# Patient Record
Sex: Female | Born: 1945
Health system: Southern US, Community
[De-identification: ages and names within clinical notes are randomized; demographics above are authoritative.]

## PROBLEM LIST (undated history)

## (undated) DIAGNOSIS — I5189 Other ill-defined heart diseases: Secondary | ICD-10-CM

## (undated) DIAGNOSIS — K5790 Diverticulosis of intestine, part unspecified, without perforation or abscess without bleeding: Secondary | ICD-10-CM

## (undated) DIAGNOSIS — I519 Heart disease, unspecified: Secondary | ICD-10-CM

## (undated) DIAGNOSIS — Z780 Asymptomatic menopausal state: Secondary | ICD-10-CM

## (undated) DIAGNOSIS — I4891 Unspecified atrial fibrillation: Secondary | ICD-10-CM

## (undated) DIAGNOSIS — E785 Hyperlipidemia, unspecified: Secondary | ICD-10-CM

## (undated) DIAGNOSIS — M109 Gout, unspecified: Secondary | ICD-10-CM

## (undated) DIAGNOSIS — K56609 Unspecified intestinal obstruction, unspecified as to partial versus complete obstruction: Secondary | ICD-10-CM

## (undated) HISTORY — DX: Hyperlipidemia, unspecified: E78.5

## (undated) HISTORY — DX: Diverticulosis of intestine, part unspecified, without perforation or abscess without bleeding: K57.90

## (undated) HISTORY — PX: CATARACT EXTRACTION: SUR2

## (undated) HISTORY — DX: Asymptomatic menopausal state: Z78.0

---
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
## 1999-06-11 HISTORY — PX: APPENDECTOMY: SHX54

## 1999-06-11 HISTORY — PX: ABDOMINAL HYSTERECTOMY: SHX81

## 1999-06-11 HISTORY — PX: UMBILICAL HERNIA REPAIR: SHX196

## 2005-08-28 ENCOUNTER — Ambulatory Visit (HOSPITAL_COMMUNITY): Admission: RE | Admit: 2005-08-28 | Discharge: 2005-08-28 | Payer: Self-pay | Admitting: Family Medicine

## 2011-07-24 ENCOUNTER — Ambulatory Visit (INDEPENDENT_AMBULATORY_CARE_PROVIDER_SITE_OTHER): Payer: BC Managed Care – PPO | Admitting: Internal Medicine

## 2011-07-24 ENCOUNTER — Ambulatory Visit (HOSPITAL_BASED_OUTPATIENT_CLINIC_OR_DEPARTMENT_OTHER)
Admission: RE | Admit: 2011-07-24 | Discharge: 2011-07-24 | Disposition: A | Discharge status: Home / Self Care | Payer: BC Managed Care – PPO | Source: Ambulatory Visit | Attending: Internal Medicine | Admitting: Internal Medicine

## 2011-07-24 ENCOUNTER — Ambulatory Visit (INDEPENDENT_AMBULATORY_CARE_PROVIDER_SITE_OTHER)
Admission: RE | Admit: 2011-07-24 | Discharge: 2011-07-24 | Disposition: A | Discharge status: Home / Self Care | Payer: BC Managed Care – PPO | Source: Ambulatory Visit | Attending: Internal Medicine | Admitting: Internal Medicine

## 2011-07-24 ENCOUNTER — Other Ambulatory Visit: Payer: Self-pay | Admitting: Internal Medicine

## 2011-07-24 ENCOUNTER — Encounter: Payer: Self-pay | Admitting: Internal Medicine

## 2011-07-24 DIAGNOSIS — M129 Arthropathy, unspecified: Secondary | ICD-10-CM

## 2011-07-24 DIAGNOSIS — R030 Elevated blood-pressure reading, without diagnosis of hypertension: Secondary | ICD-10-CM

## 2011-07-24 DIAGNOSIS — M1710 Unilateral primary osteoarthritis, unspecified knee: Secondary | ICD-10-CM

## 2011-07-24 DIAGNOSIS — M1990 Unspecified osteoarthritis, unspecified site: Secondary | ICD-10-CM

## 2011-07-24 DIAGNOSIS — Z1231 Encounter for screening mammogram for malignant neoplasm of breast: Secondary | ICD-10-CM

## 2011-07-24 DIAGNOSIS — Z139 Encounter for screening, unspecified: Secondary | ICD-10-CM

## 2011-07-24 DIAGNOSIS — E785 Hyperlipidemia, unspecified: Secondary | ICD-10-CM

## 2011-07-24 DIAGNOSIS — IMO0002 Reserved for concepts with insufficient information to code with codable children: Secondary | ICD-10-CM | POA: Insufficient documentation

## 2011-07-24 DIAGNOSIS — K5790 Diverticulosis of intestine, part unspecified, without perforation or abscess without bleeding: Secondary | ICD-10-CM | POA: Insufficient documentation

## 2011-07-24 DIAGNOSIS — R229 Localized swelling, mass and lump, unspecified: Secondary | ICD-10-CM

## 2011-07-24 DIAGNOSIS — M25569 Pain in unspecified knee: Secondary | ICD-10-CM

## 2011-07-24 DIAGNOSIS — M25469 Effusion, unspecified knee: Secondary | ICD-10-CM

## 2011-07-24 DIAGNOSIS — M79609 Pain in unspecified limb: Secondary | ICD-10-CM

## 2011-07-24 DIAGNOSIS — M19049 Primary osteoarthritis, unspecified hand: Secondary | ICD-10-CM

## 2011-07-24 DIAGNOSIS — Z780 Asymptomatic menopausal state: Secondary | ICD-10-CM | POA: Insufficient documentation

## 2011-07-24 NOTE — Patient Instructions (Signed)
To go for xray now  Schedule CPE  Check  BP at an outpatient pharmacy  Labs will be mailed to you
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
## 2011-07-25 LAB — COMPREHENSIVE METABOLIC PANEL
ALT: 22 U/L (ref 0–35)
AST: 22 U/L (ref 0–37)
BUN: 12 mg/dL (ref 6–23)
CO2: 25 mEq/L (ref 19–32)
Calcium: 9.2 mg/dL (ref 8.4–10.5)
Chloride: 107 mEq/L (ref 96–112)
Creat: 0.8 mg/dL (ref 0.50–1.10)
Potassium: 5 mEq/L (ref 3.5–5.3)
Total Bilirubin: 0.6 mg/dL (ref 0.3–1.2)
Total Protein: 7.1 g/dL (ref 6.0–8.3)

## 2011-07-25 LAB — CBC WITH DIFFERENTIAL/PLATELET
Basophils Absolute: 0 10*3/uL (ref 0.0–0.1)
HCT: 45.5 % (ref 36.0–46.0)
MCV: 88.3 fL (ref 78.0–100.0)
Monocytes Absolute: 0.4 10*3/uL (ref 0.1–1.0)
Monocytes Relative: 6 % (ref 3–12)
Platelets: 230 10*3/uL (ref 150–400)
WBC: 6.7 10*3/uL (ref 4.0–10.5)

## 2011-07-25 LAB — LIPID PANEL
Cholesterol: 220 mg/dL — ABNORMAL HIGH (ref 0–200)
HDL: 67 mg/dL (ref >39)
LDL Cholesterol: 134 mg/dL — ABNORMAL HIGH (ref 0–99)
Triglycerides: 95 mg/dL (ref <150)
VLDL: 19 mg/dL (ref 0–40)

## 2011-07-26 LAB — ANA: Anti Nuclear Antibody(ANA): NEGATIVE

## 2011-07-29 ENCOUNTER — Encounter: Payer: Self-pay | Admitting: Emergency Medicine

## 2011-08-06 ENCOUNTER — Encounter: Payer: Self-pay | Admitting: Internal Medicine

## 2011-08-06 ENCOUNTER — Encounter: Payer: Self-pay | Admitting: Gastroenterology

## 2011-08-06 ENCOUNTER — Ambulatory Visit (INDEPENDENT_AMBULATORY_CARE_PROVIDER_SITE_OTHER): Payer: BC Managed Care – PPO | Admitting: Internal Medicine

## 2011-08-06 VITALS — BP 148/84 | HR 70 | Temp 97.0°F | Resp 12 | Ht 65.0 in | Wt 231.0 lb

## 2011-08-06 DIAGNOSIS — M1990 Unspecified osteoarthritis, unspecified site: Secondary | ICD-10-CM

## 2011-08-06 DIAGNOSIS — IMO0001 Reserved for inherently not codable concepts without codable children: Secondary | ICD-10-CM

## 2011-08-06 DIAGNOSIS — Z23 Encounter for immunization: Secondary | ICD-10-CM

## 2011-08-06 DIAGNOSIS — Z139 Encounter for screening, unspecified: Secondary | ICD-10-CM

## 2011-08-06 DIAGNOSIS — Z800 Family history of malignant neoplasm of digestive organs: Secondary | ICD-10-CM

## 2011-08-06 DIAGNOSIS — Z0000 Encounter for general adult medical examination without abnormal findings: Secondary | ICD-10-CM

## 2011-08-06 DIAGNOSIS — E669 Obesity, unspecified: Secondary | ICD-10-CM

## 2011-08-06 DIAGNOSIS — Z780 Asymptomatic menopausal state: Secondary | ICD-10-CM

## 2011-08-06 DIAGNOSIS — N951 Menopausal and female climacteric states: Secondary | ICD-10-CM

## 2011-08-06 DIAGNOSIS — E785 Hyperlipidemia, unspecified: Secondary | ICD-10-CM

## 2011-08-06 DIAGNOSIS — R030 Elevated blood-pressure reading, without diagnosis of hypertension: Secondary | ICD-10-CM

## 2011-08-06 LAB — POCT URINALYSIS DIPSTICK
Leukocytes, UA: NEGATIVE
Nitrite, UA: NEGATIVE
Protein, UA: NEGATIVE

## 2011-08-06 NOTE — Patient Instructions (Signed)
  To see GI MD for colonoscopy  Follow dash diet.    Will refer to rheumatology

## 2011-08-06 NOTE — Progress Notes (Signed)
Subjective:    Patient ID: Kelly Walker, female    DOB: 1945-08-28, 66 y.o.   MRN: [id number]  HPI Kelly Walker is here for comprehensive eval.  Overall doing well.   I reviewed in detail her lab results and imaging studies of her hands and knees.  She had OA of many joints.  Takes one Advil on occasion for discomfort.    She is due for Tetanus.  Last colonscopy 2001 and has FH of colon CA in uncle and father.  She denies wt loss or change in stools or blood in stools.  Kelly Walker brings a copy of her outpt bp's that she has been checking at home  Most SBP are in 120 to upper 130's.  Rare SBP is over 140  .  She denies chest pain, headache, visual changes  Allergies  Allergen Reactions  . Ciprofloxacin Anxiety  . Flagyl (Metronidazole) Anxiety   Past Medical History  Diagnosis Date  . Menopause   . Hyperlipidemia   . Diverticulosis    Past Surgical History  Procedure Date  . Tonsillectomy 1957  . Abdominal hysterectomy 2001   History   Social History  . Marital Status: Married    Spouse Name: N/A    Number of Children: N/A  . Years of Education: N/A   Occupational History  . Not on file.   Social History Main Topics  . Smoking status: Former Smoker -- 1.0 packs/day for 4 years    Quit date: 06/10/1972  . Smokeless tobacco: Never Used  . Alcohol Use: Yes     socially  . Drug Use: No  . Sexually Active: Yes   Other Topics Concern  . Not on file   Social History Narrative  . No narrative on file   Family History  Problem Relation Age of Onset  . Heart failure Mother   . Diabetes Mother   . Heart attack Father   . Colon cancer Father    Patient Active Problem List  Diagnoses  . Hyperlipidemia  . Menopause  . Diverticulosis  . Elevated blood pressure  . DJD (degenerative joint disease)   Current Outpatient Prescriptions on File Prior to Visit  Medication Sig Dispense Refill  . ibuprofen (ADVIL,MOTRIN) 200 MG tablet Take 200 mg by mouth every 6 (six) hours as  needed.           Review of Systems  Constitutional: Negative for unexpected weight change.  HENT: Negative for hearing loss and ear pain.   Eyes: Negative for visual disturbance.  Respiratory: Negative for cough, chest tightness, shortness of breath and wheezing.   Cardiovascular: Positive for leg swelling. Negative for chest pain and palpitations.  Gastrointestinal: Negative for abdominal pain.  Genitourinary: Negative for dysuria, frequency and pelvic pain.  Musculoskeletal: Positive for joint swelling and arthralgias. Negative for gait problem.  Skin: Negative for rash.  Neurological: Negative for speech difficulty, light-headedness, numbness and headaches.       Objective:   Physical Exam Physical Exam  Nursing note and vitals reviewed.  Constitutional: She is oriented to person, place, and time. She appears well-developed and well-nourished.  HENT:  Head: Normocephalic and atraumatic.  Right Ear: Tympanic membrane and ear canal normal. No drainage. Tympanic membrane is not injected and not erythematous.  Left Ear: Tympanic membrane and ear canal normal. No drainage. Tympanic membrane is not injected and not erythematous.  Nose: Nose normal. Right sinus exhibits no maxillary sinus tenderness and no frontal sinus tenderness. Left sinus exhibits no maxillary  sinus tenderness and no frontal sinus tenderness.  Mouth/Throat: Oropharynx is clear and moist. No oral lesions. No oropharyngeal exudate.  Eyes: Conjunctivae and EOM are normal. Pupils are equal, round, and reactive to light.  Neck: Normal range of motion. Neck supple. No JVD present. Carotid bruit is not present. No mass and no thyromegaly present.  Cardiovascular: Normal rate, regular rhythm, S1 normal, S2 normal and intact distal pulses. Exam reveals no gallop and no friction rub.  No murmur heard.  Pulses:  Carotid pulses are 2+ on the right side, and 2+ on the left side.  Dorsalis pedis pulses are 2+ on the right  side, and 2+ on the left side.  No carotid bruit. No LE edema  Pulmonary/Chest: Breath sounds normal. She has no wheezes. She has no rales. She exhibits no tenderness. Breasts no discrete masses no nipple discharge no axillary adenopathy bilaterally Abdominal: Soft. Bowel sounds are normal. She exhibits no distension and no mass. There is no hepatosplenomegaly. There is no tenderness. There is no CVA tenderness.  Rectal bbrown stool guaiac neg.  Musculoskeletal: Normal range of motion.   OA changes both hands No active synovitis to joints.  Lymphadenopathy:  She has no cervical adenopathy.  She has no axillary adenopathy.  Right: No inguinal and no supraclavicular adenopathy present.  Left: No inguinal and no supraclavicular adenopathy present.  Neurological: She is alert and oriented to person, place, and time. She has normal strength and normal reflexes. She displays no tremor. No cranial nerve deficit or sensory deficit. Coordination and gait normal.  Skin: Skin is warm and dry. No rash noted. No cyanosis. Nails show no clubbing.  Psychiatric: She has a normal mood and affect. Her speech is normal and behavior is normal. Cognition and memory are normal.       Assessment & Plan:  1)  Health Maintenance  See scanned HM sheet.  Tdap today will refer to GI for colonscopy 2)  Elevated BP.  May be some component of white coaT HTN.  OUtpt bp's mostly in normal range.  Will recheck 6 months/  She does not wish meds at this point.  Recommended low salt, DASH diet 3)  Hyperlipidemia  DASH diet  She does not want meds at this point 4)  OA   Will refer to rheumatology 5)  Menopause 6)  FAmily history of colon CA  Needs screening q 5 years

## 2011-08-15 ENCOUNTER — Ambulatory Visit (AMBULATORY_SURGERY_CENTER): Payer: BC Managed Care – PPO | Admitting: *Deleted

## 2011-08-15 ENCOUNTER — Encounter: Payer: Self-pay | Admitting: Gastroenterology

## 2011-08-15 VITALS — Ht 65.0 in | Wt 231.5 lb

## 2011-08-15 DIAGNOSIS — Z1211 Encounter for screening for malignant neoplasm of colon: Secondary | ICD-10-CM

## 2011-08-15 MED ORDER — PEG-KCL-NACL-NASULF-NA ASC-C 100 G PO SOLR: ORAL | Status: DC

## 2011-08-15 NOTE — Progress Notes (Addendum)
Last colonoscopy over 10 years ago.  Pt does not remember the physician's name that performed the procedure.  Pt says that colonoscopy was normal.  Family hx colon cancer in father and paternal uncle.  Was told to have colonoscopy every 5 years.  Kelly Walker

## 2011-08-28 ENCOUNTER — Ambulatory Visit (AMBULATORY_SURGERY_CENTER): Payer: BC Managed Care – PPO | Admitting: Gastroenterology

## 2011-08-28 ENCOUNTER — Encounter: Payer: Self-pay | Admitting: Gastroenterology

## 2011-08-28 VITALS — BP 160/88 | HR 67 | Temp 99.3°F | Resp 17 | Ht 65.0 in | Wt 231.0 lb

## 2011-08-28 DIAGNOSIS — D126 Benign neoplasm of colon, unspecified: Secondary | ICD-10-CM

## 2011-08-28 DIAGNOSIS — K5730 Diverticulosis of large intestine without perforation or abscess without bleeding: Secondary | ICD-10-CM

## 2011-08-28 DIAGNOSIS — Z1211 Encounter for screening for malignant neoplasm of colon: Secondary | ICD-10-CM

## 2011-08-28 MED ORDER — SODIUM CHLORIDE 0.9 % IV SOLN: 500.0000 mL | INTRAVENOUS | Status: DC

## 2011-08-28 NOTE — Progress Notes (Signed)
Patient did not experience any of the following events: a burn prior to discharge; a fall within the facility; wrong site/side/patient/procedure/implant event; or a hospital transfer or hospital admission upon discharge from the facility. (G8907) Patient did not have preoperative order for IV antibiotic SSI prophylaxis. (G8918)  

## 2011-08-28 NOTE — Progress Notes (Addendum)
PT. Decided she did not want sedation, because she is going to have cataracts surgery on Monday and she did not want additional medication in her system. Pt. Also concerned about red area to her left hand knuckles area appears red with fading grey discoloration surrounding red area to knuckles,pt. Made Dr. Arlyce Dice aware and he stated that look like it was caused by trauma not the prep.Knuckles pt. Concerned about is pinky and ring finger to left hand.

## 2011-08-28 NOTE — Op Note (Addendum)
[facility] [address] [place], [place]  [address]  COLONOSCOPY PROCEDURE REPORT  PATIENT:  Kelly Walker, Kelly Walker  MR#:  [id number] BIRTHDATE:  February 16, 1946, 66 yrs. old  GENDER:  female ENDOSCOPIST:  Barbette Hair. Arlyce Dice, MD REF. BY:  Raechel Chute, M.D. PROCEDURE DATE:  08/28/2011 PROCEDURE:  Colonoscopy with snare polypectomy ASA CLASS:  Class I INDICATIONS:  FAmily history of colon cancer (falther and franternal uncle); high risk screening MEDICATIONS:  None  DESCRIPTION OF PROCEDURE:   After the risks benefits and alternatives of the procedure were thoroughly explained, informed consent was obtained.  Digital rectal exam was performed and revealed no abnormalities.   The [id number] endoscope was introduced through the anus and advanced to the cecum, which was identified by both the appendix and ileocecal valve, without limitations.  The quality of the prep was good, using MoviPrep. The instrument was then slowly withdrawn as the colon was fully examined. <<PROCEDUREIMAGES>>  FINDINGS:  A sessile polyp was found in the descending colon. It was 3 mm in size. Polyp was snared without cautery. Retrieval was successful (see image4). snare polyp  A sessile polyp was found in the sigmoid colon. It was 3 mm in size. It was found 20 cm from the point of entry. Polyp was snared without cautery. Retrieval was successful (see image5). snare polyp  Moderate diverticulosis was found in the sigmoid colon (see image1).  This was otherwise a normal examination of the colon (see image2 and image6). Retroflexed views in the rectum revealed no abnormalities.    The time to cecum =  1) 8.75  minutes. The scope was then withdrawn in 1) 10.25  minutes from the cecum and the procedure completed. COMPLICATIONS:  None ENDOSCOPIC IMPRESSION: 1) 3 mm sessile polyp in the descending colon 2) 3 mm sessile polyp in the sigmoid colon 3) Moderate diverticulosis in the sigmoid colon 4) Otherwise  normal examination RECOMMENDATIONS: 1) In view of your family history of colon cancer I recommend you repeat this procedure in 5 years REPEAT EXAM:  colonoscopy 5 years  ______________________________ Barbette Hair. Arlyce Dice, MD  CC:  n. REVISED:  08/28/2011 11:53 AM eSIGNED:   Barbette Hair. Stpehanie Montroy at 08/28/2011 11:53 AM  Marchelle Gearing, [phone]

## 2011-08-28 NOTE — Patient Instructions (Addendum)
Colon Polyps A polyp is extra tissue that grows inside your body. Colon polyps grow in the large intestine. The large intestine, also called the colon, is part of your digestive system. It is a long, hollow tube at the end of your digestive tract where your body makes and stores stool. Most polyps are not dangerous. They are benign. This means they are not cancerous. But over time, some types of polyps can turn into cancer. Polyps that are smaller than a pea are usually not harmful. But larger polyps could someday become or may already be cancerous. To be safe, doctors remove all polyps and test them.  WHO GETS POLYPS? Anyone can get polyps, but certain people are more likely than others. You may have a greater chance of getting polyps if:  You are over 50.   You have had polyps before.   Someone in your family has had polyps.   Someone in your family has had cancer of the large intestine.   Find out if someone in your family has had polyps. You may also be more likely to get polyps if you:   Eat a lot of fatty foods.   Smoke.   Drink alcohol.   Do not exercise.   Eat too much.  SYMPTOMS  Most small polyps do not cause symptoms. People often do not know they have one until their caregiver finds it during a regular checkup or while testing them for something else. Some people do have symptoms like these:  Bleeding from the anus. You might notice blood on your underwear or on toilet paper after you have had a bowel movement.   Constipation or diarrhea that lasts more than a week.   Blood in the stool. Blood can make stool look black or it can show up as red streaks in the stool.  If you have any of these symptoms, see your caregiver. HOW DOES THE DOCTOR TEST FOR POLYPS? The doctor can use four tests to check for polyps:  Digital rectal exam. The caregiver wears gloves and checks your rectum (the last part of the large intestine) to see if it feels normal. This test would find  polyps only in the rectum. Your caregiver may need to do one of the other tests listed below to find polyps higher up in the intestine.   Barium enema. The caregiver puts a liquid called barium into your rectum before taking x-rays of your large intestine. Barium makes your intestine look white in the pictures. Polyps are dark, so they are easy to see.   Sigmoidoscopy. With this test, the caregiver can see inside your large intestine. A thin flexible tube is placed into your rectum. The device is called a sigmoidoscope, which has a light and a tiny video camera in it. The caregiver uses the sigmoidoscope to look at the last third of your large intestine.   Colonoscopy. This test is like sigmoidoscopy, but the caregiver looks at all of the large intestine. It usually requires sedation. This is the most common method for finding and removing polyps.  TREATMENT   The caregiver will remove the polyp during sigmoidoscopy or colonoscopy. The polyp is then tested for cancer.   If you have had polyps, your caregiver may want you to get tested regularly in the future.  PREVENTION  There is not one sure way to prevent polyps. You might be able to lower your risk of getting them if you:  Eat more fruits and vegetables and less fatty  food.   Do not smoke.   Avoid alcohol.   Exercise every day.   Lose weight if you are overweight.   Eating more calcium and folate can also lower your risk of getting polyps. Some foods that are rich in calcium are milk, cheese, and broccoli. Some foods that are rich in folate are chickpeas, kidney beans, and spinach.   Aspirin might help prevent polyps. Studies are under way.  Document Released: 02/21/2004 Document Revised: 05/16/2011 Document Reviewed: 07/29/2007 [facility] Patient Information 2012 [place], [place].  Diverticulosis Diverticulosis is a common condition that develops when small pouches (diverticula) form in the wall of the colon. The risk of  diverticulosis increases with age. It happens more often in people who eat a low-fiber diet. Most individuals with diverticulosis have no symptoms. Those individuals with symptoms usually experience abdominal pain, constipation, or loose stools (diarrhea). HOME CARE INSTRUCTIONS   Increase the amount of fiber in your diet as directed by your caregiver or dietician. This may reduce symptoms of diverticulosis.   Your caregiver may recommend taking a dietary fiber supplement.   Drink at least 6 to 8 glasses of water each day to prevent constipation.   Try not to strain when you have a bowel movement.   Your caregiver may recommend avoiding nuts and seeds to prevent complications, although this is still an uncertain benefit.   Only take over-the-counter or prescription medicines for pain, discomfort, or fever as directed by your caregiver.  FOODS WITH HIGH FIBER CONTENT INCLUDE:  Fruits. Apple, peach, pear, tangerine, raisins, prunes.   Vegetables. Brussels sprouts, asparagus, broccoli, cabbage, carrot, cauliflower, romaine lettuce, spinach, summer squash, tomato, winter squash, zucchini.   Starchy Vegetables. Baked beans, kidney beans, lima beans, split peas, lentils, potatoes (with skin).   Grains. Whole wheat bread, brown rice, bran flake cereal, plain oatmeal, white rice, shredded wheat, bran muffins.  SEEK IMMEDIATE MEDICAL CARE IF:   You develop increasing pain or severe bloating.   You have an oral temperature above 102 F (38.9 C), not controlled by medicine.   You develop vomiting or bowel movements that are bloody or black.  Document Released: 02/22/2004 Document Revised: 05/16/2011 Document Reviewed: 10/25/2009 [employer] Patient Information 2012 [employer].   YOU HAD AN ENDOSCOPIC PROCEDURE TODAY AT THE [facility]: Refer to the procedure report that was given to you for any specific questions about what was found during the examination.  If the procedure  report does not answer your questions, please call your gastroenterologist to clarify.  If you requested that your care partner not be given the details of your procedure findings, then the procedure report has been included in a sealed envelope for you to review at your convenience later.  YOU SHOULD EXPECT: Some feelings of bloating in the abdomen. Passage of more gas than usual.  Walking can help get rid of the air that was put into your GI tract during the procedure and reduce the bloating. If you had a lower endoscopy (such as a colonoscopy or flexible sigmoidoscopy) you may notice spotting of blood in your stool or on the toilet paper. If you underwent a bowel prep for your procedure, then you may not have a normal bowel movement for a few days.  DIET: Your first meal following the procedure should be a light meal and then it is ok to progress to your normal diet.  A half-sandwich or bowl of soup is an example of a good first meal.  Heavy or fried  foods are harder to digest and may make you feel nauseous or bloated.  Likewise meals heavy in dairy and vegetables can cause extra gas to form and this can also increase the bloating.  Drink plenty of fluids but you should avoid alcoholic beverages for 24 hours.  ACTIVITY: Your care partner should take you home directly after the procedure.  You should plan to take it easy, moving slowly for the rest of the day.  You can resume normal activity the day after the procedure however you should NOT DRIVE or use heavy machinery for 24 hours (because of the sedation medicines used during the test).    SYMPTOMS TO REPORT IMMEDIATELY: A gastroenterologist can be reached at any hour.  During normal business hours, 8:30 AM to 5:00 PM Monday through Friday, call [phone].  After hours and on weekends, please call the GI answering service at [phone] who will take a message and have the physician on call contact you.   Following lower endoscopy  (colonoscopy or flexible sigmoidoscopy):  Excessive amounts of blood in the stool  Significant tenderness or worsening of abdominal pains  Swelling of the abdomen that is new, acute  Fever of 100F or higher    FOLLOW UP: If any biopsies were taken you will be contacted by phone or by letter within the next 1-3 weeks.  Call your gastroenterologist if you have not heard about the biopsies in 3 weeks.  Our staff will call the home number listed on your records the next business day following your procedure to check on you and address any questions or concerns that you may have at that time regarding the information given to you following your procedure. This is a courtesy call and so if there is no answer at the home number and we have not heard from you through the emergency physician on call, we will assume that you have returned to your regular daily activities without incident.  SIGNATURES/CONFIDENTIALITY: You and/or your care partner have signed paperwork which will be entered into your electronic medical record.  These signatures attest to the fact that that the information above on your After Visit Summary has been reviewed and is understood.  Full responsibility of the confidentiality of this discharge information lies with you and/or your care-partner.   INFO . ON POLYPS, DIVERTICULOSIS, & HIGH FIBER DIET ATTACHED TO THIS D/C INSTRUCTIONS BY DR Arlyce Dice

## 2011-08-29 ENCOUNTER — Telehealth: Payer: Self-pay | Admitting: *Deleted

## 2011-08-29 NOTE — Telephone Encounter (Signed)
  Follow up Call-  Call back number 08/28/2011  Post procedure Call Back phone  # [phone]  Permission to leave phone message No     Patient questions:  Do you have a fever, pain , or abdominal swelling? no Pain Score  0 *  Have you tolerated food without any problems? yes  Have you been able to return to your normal activities? yes  Do you have any questions about your discharge instructions: Diet   no Medications  no Follow up visit  no  Do you have questions or concerns about your Care? no  Actions: * If pain score is 4 or above: No action needed, pain <4.

## 2011-09-02 ENCOUNTER — Encounter: Payer: Self-pay | Admitting: Gastroenterology

## 2011-10-15 ENCOUNTER — Telehealth: Payer: Self-pay | Admitting: Internal Medicine

## 2011-10-15 ENCOUNTER — Other Ambulatory Visit: Payer: Self-pay | Admitting: *Deleted

## 2011-10-15 DIAGNOSIS — E790 Hyperuricemia without signs of inflammatory arthritis and tophaceous disease: Secondary | ICD-10-CM

## 2011-10-15 NOTE — Telephone Encounter (Signed)
Pt called in on 10/14/2011 about getting her blood work done. She needs to know if she can just come in to have it done or do she need to make an appointment. Please call pt at [phone]. Thanks

## 2011-10-15 NOTE — Telephone Encounter (Signed)
Called patient, states at last visit uric acid was elevated, was supposed to recheck uric acid in a few months- wants to get it done before goes to [place] next week.  Infomred patient would discuss with doctor and call her back. Discussed with Dr. Constance Goltz, ordered uric acid , called patient , will come tomorrow and get drawn in lab. Patient voices understanding( needs to pick up requisiton first)

## 2011-10-16 ENCOUNTER — Other Ambulatory Visit: Payer: Self-pay | Admitting: Internal Medicine

## 2011-10-17 LAB — URIC ACID: Uric Acid, Serum: 7.4 mg/dL — ABNORMAL HIGH (ref 2.4–7.0)

## 2011-10-21 ENCOUNTER — Telehealth: Payer: Self-pay | Admitting: Internal Medicine

## 2011-10-21 NOTE — Telephone Encounter (Signed)
Pt would like to have a call to her today about the lab results. Please call [phone]. Thanks

## 2011-10-21 NOTE — Telephone Encounter (Signed)
Reviewed result with Dr. Constance Goltz and called patient and informed her now uric acid is 7.4 , last was 7.9 , so had dropped 0.5 , closer to normal ( normal 7.0 or less). No new orders.

## 2012-11-03 ENCOUNTER — Encounter (HOSPITAL_COMMUNITY): Payer: Self-pay | Admitting: Emergency Medicine

## 2012-11-03 ENCOUNTER — Emergency Department (HOSPITAL_COMMUNITY): Payer: Medicare Other

## 2012-11-03 ENCOUNTER — Inpatient Hospital Stay (HOSPITAL_COMMUNITY)
Admission: EM | Admit: 2012-11-03 | Discharge: 2012-11-06 | DRG: 390 | Disposition: A | Discharge status: Home / Self Care | Payer: Medicare Other | Attending: Surgery | Admitting: Surgery

## 2012-11-03 DIAGNOSIS — R112 Nausea with vomiting, unspecified: Secondary | ICD-10-CM

## 2012-11-03 DIAGNOSIS — R109 Unspecified abdominal pain: Secondary | ICD-10-CM

## 2012-11-03 DIAGNOSIS — K5730 Diverticulosis of large intestine without perforation or abscess without bleeding: Secondary | ICD-10-CM | POA: Diagnosis present

## 2012-11-03 DIAGNOSIS — Z87891 Personal history of nicotine dependence: Secondary | ICD-10-CM

## 2012-11-03 DIAGNOSIS — K56609 Unspecified intestinal obstruction, unspecified as to partial versus complete obstruction: Principal | ICD-10-CM | POA: Diagnosis present

## 2012-11-03 DIAGNOSIS — K8020 Calculus of gallbladder without cholecystitis without obstruction: Secondary | ICD-10-CM

## 2012-11-03 DIAGNOSIS — M109 Gout, unspecified: Secondary | ICD-10-CM | POA: Diagnosis present

## 2012-11-03 DIAGNOSIS — E785 Hyperlipidemia, unspecified: Secondary | ICD-10-CM | POA: Diagnosis present

## 2012-11-03 DIAGNOSIS — R1115 Cyclical vomiting syndrome unrelated to migraine: Secondary | ICD-10-CM | POA: Diagnosis present

## 2012-11-03 DIAGNOSIS — E669 Obesity, unspecified: Secondary | ICD-10-CM | POA: Diagnosis present

## 2012-11-03 DIAGNOSIS — Z90710 Acquired absence of both cervix and uterus: Secondary | ICD-10-CM

## 2012-11-03 DIAGNOSIS — E860 Dehydration: Secondary | ICD-10-CM | POA: Diagnosis present

## 2012-11-03 DIAGNOSIS — Z6834 Body mass index (BMI) 34.0-34.9, adult: Secondary | ICD-10-CM

## 2012-11-03 DIAGNOSIS — Z9089 Acquired absence of other organs: Secondary | ICD-10-CM

## 2012-11-03 DIAGNOSIS — R5381 Other malaise: Secondary | ICD-10-CM | POA: Diagnosis present

## 2012-11-03 HISTORY — DX: Unspecified intestinal obstruction, unspecified as to partial versus complete obstruction: K56.609

## 2012-11-03 HISTORY — DX: Gout, unspecified: M10.9

## 2012-11-03 LAB — COMPREHENSIVE METABOLIC PANEL
ALT: 174 U/L — ABNORMAL HIGH (ref 0–35)
Albumin: 4.3 g/dL (ref 3.5–5.2)
Alkaline Phosphatase: 125 U/L — ABNORMAL HIGH (ref 39–117)
Chloride: 93 mEq/L — ABNORMAL LOW (ref 96–112)
Glucose, Bld: 159 mg/dL — ABNORMAL HIGH (ref 70–99)
Potassium: 4.5 mEq/L (ref 3.5–5.1)
Sodium: 135 mEq/L (ref 135–145)
Total Bilirubin: 1.8 mg/dL — ABNORMAL HIGH (ref 0.3–1.2)
Total Protein: 9.1 g/dL — ABNORMAL HIGH (ref 6.0–8.3)

## 2012-11-03 LAB — CBC WITH DIFFERENTIAL/PLATELET
Basophils Relative: 0 % (ref 0–1)
Eosinophils Absolute: 0 10*3/uL (ref 0.0–0.7)
Lymphs Abs: 2.1 10*3/uL (ref 0.7–4.0)
MCH: 30.2 pg (ref 26.0–34.0)
Neutro Abs: 3.9 10*3/uL (ref 1.7–7.7)
Neutrophils Relative %: 53 % (ref 43–77)
Platelets: 254 10*3/uL (ref 150–400)
RBC: 6 MIL/uL — ABNORMAL HIGH (ref 3.87–5.11)

## 2012-11-03 MED ORDER — LACTATED RINGERS IV SOLN
INTRAVENOUS | Status: DC
  Administered 2012-11-03: 20:00:00 via INTRAVENOUS

## 2012-11-03 MED ORDER — IOHEXOL 300 MG/ML  SOLN
50.0000 mL | Freq: Once | INTRAMUSCULAR | Status: AC | PRN
  Administered 2012-11-03: 50 mL via ORAL

## 2012-11-03 MED ORDER — IOHEXOL 300 MG/ML  SOLN
100.0000 mL | Freq: Once | INTRAMUSCULAR | Status: AC | PRN
  Administered 2012-11-03: 100 mL via INTRAVENOUS

## 2012-11-03 MED ORDER — HEPARIN SODIUM (PORCINE) 5000 UNIT/ML IJ SOLN
5000.0000 [IU] | Freq: Three times a day (TID) | INTRAMUSCULAR | Status: DC
  Administered 2012-11-03 – 2012-11-06 (×8): 5000 [IU] via SUBCUTANEOUS
  Filled 2012-11-03 (×11): qty 1

## 2012-11-03 MED ORDER — ONDANSETRON HCL 4 MG/2ML IJ SOLN: 4.0000 mg | Freq: Four times a day (QID) | INTRAMUSCULAR | Status: DC | PRN

## 2012-11-03 MED ORDER — MORPHINE SULFATE 2 MG/ML IJ SOLN: 2.0000 mg | INTRAMUSCULAR | Status: DC | PRN

## 2012-11-03 NOTE — H&P (Signed)
Kelly Walker is an 67 y.o. female.   Chief Complaint: abdominal pain, nausea and vomiting HPI: patient is a pleasant 67 year old female who was in her usual state of health until 3 days ago. At that time she developed the fairly rapid onset of abdominal pain. She describes severe pressure-like pain in her mid abdomen like "labor pains". This was followed soon thereafter by nausea and vomiting. She has had continued bilious vomiting. She has been trying to drink fluids for 2 days but then will develop increased pressure and recurrent vomiting. She states the pain became better after about a day but would recur when she tried to drink. She did have a small bowel movement yesterday and this morning but has had a couple of larger loose bowel movements since coming to the emergency room today. She has had an NG tube placed. She currently denies any pain or nausea or other complaints. She has no similar chronic symptoms. She does have some irritable bowel like syndromes with certain foods causing diarrhea and urgency of bowel movements.  Due to persistent vomiting and feeling that she was getting weak and dehydrated she presented today to urgent care center and was sent to the emergency room for further evaluation and treatment.  Past Medical History  Diagnosis Date  . Menopause   . Hyperlipidemia   . Diverticulosis   . Gout     Past Surgical History  Procedure Laterality Date  . Tonsillectomy  1957  . Abdominal hysterectomy  2001  . Appendectomy  2001  . Umbilical hernia repair  2001    Family History  Problem Relation Age of Onset  . Heart failure Mother   . Diabetes Mother   . Heart attack Father   . Colon cancer Father 24  . Colon cancer Paternal Uncle 74  . Stomach cancer Cousin    Social History:  reports that she quit smoking about 40 years ago. She has never used smokeless tobacco. She reports that  drinks alcohol. She reports that she does not use illicit drugs.  Allergies:   Allergies  Allergen Reactions  . Sulfa Antibiotics     Joint pain  . Ciprofloxacin Anxiety  . Flagyl (Metronidazole) Anxiety     (Not in a hospital admission)  Results for orders placed during the hospital encounter of 11/03/12 (from the past 48 hour(s))  CBC WITH DIFFERENTIAL     Status: Abnormal   Collection Time    11/03/12 12:05 PM      Result Value Range   WBC 7.4  4.0 - 10.5 K/uL   RBC 6.00 (*) 3.87 - 5.11 MIL/uL   Hemoglobin 18.1 (*) 12.0 - 15.0 g/dL   HCT 40.9 (*) 81.1 - 91.4 %   MCV 84.3  78.0 - 100.0 fL   MCH 30.2  26.0 - 34.0 pg   MCHC 35.8  30.0 - 36.0 g/dL   RDW 78.2  95.6 - 21.3 %   Platelets 254  150 - 400 K/uL   Neutrophils Relative % 53  43 - 77 %   Neutro Abs 3.9  1.7 - 7.7 K/uL   Lymphocytes Relative 29  12 - 46 %   Lymphs Abs 2.1  0.7 - 4.0 K/uL   Monocytes Relative 18 (*) 3 - 12 %   Monocytes Absolute 1.3 (*) 0.1 - 1.0 K/uL   Eosinophils Relative 0  0 - 5 %   Eosinophils Absolute 0.0  0.0 - 0.7 K/uL   Basophils Relative 0  0 -  1 %   Basophils Absolute 0.0  0.0 - 0.1 K/uL  COMPREHENSIVE METABOLIC PANEL     Status: Abnormal   Collection Time    11/03/12 12:05 PM      Result Value Range   Sodium 135  135 - 145 mEq/L   Potassium 4.5  3.5 - 5.1 mEq/L   Chloride 93 (*) 96 - 112 mEq/L   CO2 25  19 - 32 mEq/L   Glucose, Bld 159 (*) 70 - 99 mg/dL   BUN 32 (*) 6 - 23 mg/dL   Creatinine, Ser 7.82 (*) 0.50 - 1.10 mg/dL   Calcium 95.6 (*) 8.4 - 10.5 mg/dL   Total Protein 9.1 (*) 6.0 - 8.3 g/dL   Albumin 4.3  3.5 - 5.2 g/dL   AST 213 (*) 0 - 37 U/L   ALT 174 (*) 0 - 35 U/L   Alkaline Phosphatase 125 (*) 39 - 117 U/L   Total Bilirubin 1.8 (*) 0.3 - 1.2 mg/dL   GFR calc non Af Amer 31 (*) >90 mL/min   GFR calc Af Amer 36 (*) >90 mL/min   Comment:            The eGFR has been calculated     using the CKD EPI equation.     This calculation has not been     validated in all clinical     situations.     eGFR's persistently     <90 mL/min signify      possible Chronic Kidney Disease.  LIPASE, BLOOD     Status: None   Collection Time    11/03/12 12:05 PM      Result Value Range   Lipase 56  11 - 59 U/L   US Abdomen Complete  11/03/2012   *RADIOLOGY REPORT*  Clinical Data:  Abdominal pain, abnormal LFTs  COMPLETE ABDOMINAL ULTRASOUND  Comparison:  None.  Findings:  Gallbladder:  Multiple layering gallstones.  No gallbladder wall thickening or pericholecystic fluid.  Negative sonographic Murphy's sign.  Common bile duct:  Measures 6 mm.  Liver:  Mildly nodular hepatic contour, nonspecific.  No focal lesion identified.  At the upper limits of normal for parenchymal echogenicity.  IVC:  Appears normal.  Pancreas:  Not visualized due to overlying bowel gas.  Spleen:  Measures 7.1 cm.  Right Kidney:  Measures 10.5 cm.  No mass or hydronephrosis.  Left Kidney:  Measures 11.0 cm.  No mass or hydronephrosis.  Abdominal aorta:  No aneurysm identified.  IMPRESSION: Cholelithiasis, without associated findings to suggest acute cholecystitis.  Mildly nodular hepatic contour, nonspecific.  Very early/mild cirrhosis is possible.   Original Report Authenticated By: Charline Bills, M.D.   Ct Abdomen Pelvis W Contrast  11/03/2012   *RADIOLOGY REPORT*  Clinical Data: Abdominal pain.  Nausea and vomiting.  Prior hysterectomy and appendectomy.  CT ABDOMEN AND PELVIS WITH CONTRAST  Technique:  Multidetector CT imaging of the abdomen and pelvis was performed following the standard protocol during bolus administration of intravenous contrast.  Contrast: 50mL OMNIPAQUE IOHEXOL 300 MG/ML  SOLN, OMNIPAQUE IOHEXOL 300 MG/ML  SOLN  Comparison: ultrasound of 11/03/2012.  Findings: The liver, spleen, pancreas, and adrenal glands appear unremarkable.  Numerous gallstones measure up to 1.4 cm in diameter settled dependently in the gallbladder, without gallbladder wall thickening or pericholecystic fluid.  No biliary dilatation.  The kidneys appear unremarkable, as do the  proximal ureters.  Abnormal moderately dilated small bowel with progressive dilution of the contrast  column extends to the abrupt transition point in the right lower quadrant where there is an angulated loop of bowel potentially with some mild twisting around its mesentery on image 29 of series 5.  Low-level edema observed in the mesentery.  A small ventral hernia to the right of the umbilicus contains adipose tissue and a trace amount of fluid/stranding.  Sigmoid colon diverticulosis is present without active diverticulitis.  Uterus absent.  Urinary bladder unremarkable. Fatty prominence of the ileocecal valve noted.  Scattered air-fluid levels are present in the colon.  There is degenerative arthropathy of both hips, right greater than left.  Degenerative grade 1 retrolisthesis at L5-S1 noted with intervertebral and facet spurring at this level.  IMPRESSION:  1.  Small bowel obstruction, transition point right lower quadrant, with a twisted or angulated loop of bowel at the transition point. Edema tracts along the mesentery. Surgical consultation recommended. 2.  Sigmoid diverticulosis. 3.  Cholelithiasis. 4.  Degenerative arthropathy of both hips. 5.  Lower lumbar spondylosis and degenerative disc disease.   Original Report Authenticated By: Gaylyn Rong, M.D.    Review of Systems  Constitutional: Positive for malaise/fatigue. Negative for fever and chills.  HENT: Negative.   Respiratory: Negative.   Cardiovascular: Negative.   Gastrointestinal: Positive for nausea, vomiting and abdominal pain. Negative for diarrhea, constipation, blood in stool and melena.  Genitourinary: Negative.     Blood pressure 133/98, pulse 92, temperature 97.7 F (36.5 C), temperature source Oral, resp. rate 18, SpO2 95.00%. Physical Exam  General: Alert, moderately obese Caucasian female, in no distress Skin: Warm and dry without rash or infection. HEENT: No palpable masses or thyromegaly. Sclera nonicteric. Pupils  equal round and reactive. Mucous membranes dry. Lymph nodes: No cervical, supraclavicular, or inguinal nodes palpable. Lungs: Breath sounds clear and equal without increased work of breathing Cardiovascular: Regular rate and rhythm without murmur. No JVD or edema. Peripheral pulses intact. Abdomen: mildly distended and tympanitic.Marland Kitchen Soft and nontender. No masses palpable. No organomegaly. No palpable hernias. Well-healed low midline incision. Extremities: No edema or joint swelling or deformity. No chronic venous stasis changes. Neurologic: Alert and fully oriented. Affect normal.  Assessment/Plan Small bowel obstruction likely secondary to adhesions. She has cholelithiasis, likely asymptomatic. Her pain seems significantly improved and she has had a couple of loose bowel movements hopefully indicating improvement. She is significantly dehydrated. The patient will be admitted for IV fluid rehydration and close observation. Repeat lab and abdominal x-rays tomorrow.  Laken Lobato T 11/03/2012, 6:17 PM

## 2012-11-03 NOTE — ED Notes (Signed)
Pt states that she has been having abd pain, NV since Saturday.  Was sent here from urgent care due to air or fluid on x ray.

## 2012-11-03 NOTE — ED Provider Notes (Signed)
History     CSN: [id number]  Arrival date & time 11/03/12  1113   First MD Initiated Contact with Patient 11/03/12 1307      Chief Complaint  Patient presents with  . Abdominal Pain    (Consider location/radiation/quality/duration/timing/severity/associated sxs/prior treatment) HPI Comments: Patient presents to ER for evaluation abdominal pain. Patient was seen in urgent care after having nausea and vomiting with abdominal pain for the last several days. Patient reports her pain has significantly improved. Patient still having nausea and vomiting, cannot hold anything down.  Patient is a 67 y.o. female presenting with abdominal pain.  Abdominal Pain Associated symptoms include abdominal pain.    Past Medical History  Diagnosis Date  . Menopause   . Hyperlipidemia   . Diverticulosis     Past Surgical History  Procedure Laterality Date  . Tonsillectomy  1957  . Abdominal hysterectomy  2001  . Appendectomy  2001  . Umbilical hernia repair  2001    Family History  Problem Relation Age of Onset  . Heart failure Mother   . Diabetes Mother   . Heart attack Father   . Colon cancer Father 24  . Colon cancer Paternal Uncle 50  . Stomach cancer Cousin     History  Substance Use Topics  . Smoking status: Former Smoker -- 1.00 packs/day for 4 years    Quit date: 06/10/1972  . Smokeless tobacco: Never Used  . Alcohol Use: Yes     Comment: occasional    OB History   Grav Para Term Preterm Abortions TAB SAB Ect Mult Living   2 2              Review of Systems  Constitutional: Negative for fever.  Gastrointestinal: Positive for nausea, vomiting and abdominal pain.  All other systems reviewed and are negative.    Allergies  Sulfa antibiotics; Ciprofloxacin; and Flagyl  Home Medications   Current Outpatient Rx  Name  Route  Sig  Dispense  Refill  . acetaminophen (TYLENOL) 325 MG tablet   Oral   Take 650 mg by mouth every 6 (six) hours as needed.            BP 133/98  Pulse 92  Temp(Src) 97.7 F (36.5 C) (Oral)  Resp 18  SpO2 95%  Physical Exam  Constitutional: She is oriented to person, place, and time. She appears well-developed and well-nourished. No distress.  HENT:  Head: Normocephalic and atraumatic.  Right Ear: Hearing normal.  Left Ear: Hearing normal.  Nose: Nose normal.  Mouth/Throat: Oropharynx is clear and moist and mucous membranes are normal.  Eyes: Conjunctivae and EOM are normal. Pupils are equal, round, and reactive to light.  Neck: Normal range of motion. Neck supple.  Cardiovascular: Regular rhythm, S1 normal and S2 normal.  Exam reveals no gallop and no friction rub.   No murmur heard. Pulmonary/Chest: Effort normal and breath sounds normal. No respiratory distress. She exhibits no tenderness.  Abdominal: Normal appearance. Bowel sounds are decreased. There is no hepatosplenomegaly. There is generalized tenderness. There is no rebound, no guarding, no tenderness at McBurney's point and negative Murphy's sign. No hernia.  Musculoskeletal: Normal range of motion.  Neurological: She is alert and oriented to person, place, and time. She has normal strength. No cranial nerve deficit or sensory deficit. Coordination normal. GCS eye subscore is 4. GCS verbal subscore is 5. GCS motor subscore is 6.  Skin: Skin is warm, dry and intact. No rash noted. No cyanosis.  Psychiatric: She has a normal mood and affect. Her speech is normal and behavior is normal. Thought content normal.    ED Course  Procedures (including critical care time)  Labs Reviewed  CBC WITH DIFFERENTIAL - Abnormal; Notable for the following:    RBC 6.00 (*)    Hemoglobin 18.1 (*)    HCT 50.6 (*)    Monocytes Relative 18 (*)    Monocytes Absolute 1.3 (*)    All other components within normal limits  COMPREHENSIVE METABOLIC PANEL - Abnormal; Notable for the following:    Chloride 93 (*)    Glucose, Bld 159 (*)    BUN 32 (*)    Creatinine, Ser  1.66 (*)    Calcium 10.8 (*)    Total Protein 9.1 (*)    AST 111 (*)    ALT 174 (*)    Alkaline Phosphatase 125 (*)    Total Bilirubin 1.8 (*)    GFR calc non Af Amer 31 (*)    GFR calc Af Amer 36 (*)    All other components within normal limits  LIPASE, BLOOD   US Abdomen Complete  11/03/2012   *RADIOLOGY REPORT*  Clinical Data:  Abdominal pain, abnormal LFTs  COMPLETE ABDOMINAL ULTRASOUND  Comparison:  None.  Findings:  Gallbladder:  Multiple layering gallstones.  No gallbladder wall thickening or pericholecystic fluid.  Negative sonographic Murphy's sign.  Common bile duct:  Measures 6 mm.  Liver:  Mildly nodular hepatic contour, nonspecific.  No focal lesion identified.  At the upper limits of normal for parenchymal echogenicity.  IVC:  Appears normal.  Pancreas:  Not visualized due to overlying bowel gas.  Spleen:  Measures 7.1 cm.  Right Kidney:  Measures 10.5 cm.  No mass or hydronephrosis.  Left Kidney:  Measures 11.0 cm.  No mass or hydronephrosis.  Abdominal aorta:  No aneurysm identified.  IMPRESSION: Cholelithiasis, without associated findings to suggest acute cholecystitis.  Mildly nodular hepatic contour, nonspecific.  Very early/mild cirrhosis is possible.   Original Report Authenticated By: Charline Bills, M.D.   Ct Abdomen Pelvis W Contrast  11/03/2012   *RADIOLOGY REPORT*  Clinical Data: Abdominal pain.  Nausea and vomiting.  Prior hysterectomy and appendectomy.  CT ABDOMEN AND PELVIS WITH CONTRAST  Technique:  Multidetector CT imaging of the abdomen and pelvis was performed following the standard protocol during bolus administration of intravenous contrast.  Contrast: 50mL OMNIPAQUE IOHEXOL 300 MG/ML  SOLN, OMNIPAQUE IOHEXOL 300 MG/ML  SOLN  Comparison: ultrasound of 11/03/2012.  Findings: The liver, spleen, pancreas, and adrenal glands appear unremarkable.  Numerous gallstones measure up to 1.4 cm in diameter settled dependently in the gallbladder, without gallbladder wall  thickening or pericholecystic fluid.  No biliary dilatation.  The kidneys appear unremarkable, as do the proximal ureters.  Abnormal moderately dilated small bowel with progressive dilution of the contrast column extends to the abrupt transition point in the right lower quadrant where there is an angulated loop of bowel potentially with some mild twisting around its mesentery on image 29 of series 5.  Low-level edema observed in the mesentery.  A small ventral hernia to the right of the umbilicus contains adipose tissue and a trace amount of fluid/stranding.  Sigmoid colon diverticulosis is present without active diverticulitis.  Uterus absent.  Urinary bladder unremarkable. Fatty prominence of the ileocecal valve noted.  Scattered air-fluid levels are present in the colon.  There is degenerative arthropathy of both hips, right greater than left.  Degenerative grade 1  retrolisthesis at L5-S1 noted with intervertebral and facet spurring at this level.  IMPRESSION:  1.  Small bowel obstruction, transition point right lower quadrant, with a twisted or angulated loop of bowel at the transition point. Edema tracts along the mesentery. Surgical consultation recommended. 2.  Sigmoid diverticulosis. 3.  Cholelithiasis. 4.  Degenerative arthropathy of both hips. 5.  Lower lumbar spondylosis and degenerative disc disease.   Original Report Authenticated By: Gaylyn Rong, M.D.     Diagnosis: Small bowel obstruction    MDM  Comes to the ER for evaluation of abdominal pain, nausea and vomiting. She was seen in urgent care and referred to the ER because of increasing nausea and vomiting with abdominal distention. Blood work here shows elevation of LFTs. Patient reports she does have a gallstone. She didn't have any Murphy sign, but with the elevated LFTs ultrasound is performed to further evaluate for cholecystitis as a cause of the patient's symptoms. Ultrasound did not show any acute cholecystitis. CAT scan was  performed to further evaluate abdominal distention, pain, nausea and vomiting. CAT scan is consistent with small bowel obstruction. Patient will be admitted to the hospital for further management.        Gilda Crease, MD 11/03/12 [phone]

## 2012-11-04 ENCOUNTER — Inpatient Hospital Stay (HOSPITAL_COMMUNITY): Payer: Medicare Other

## 2012-11-04 LAB — CBC
HCT: 41.1 % (ref 36.0–46.0)
MCH: 27.9 pg (ref 26.0–34.0)
MCHC: 32.6 g/dL (ref 30.0–36.0)
MCV: 85.4 fL (ref 78.0–100.0)
RDW: 13.1 % (ref 11.5–15.5)

## 2012-11-04 LAB — BASIC METABOLIC PANEL
BUN: 20 mg/dL (ref 6–23)
CO2: 27 mEq/L (ref 19–32)
Chloride: 101 mEq/L (ref 96–112)
Creatinine, Ser: 0.89 mg/dL (ref 0.50–1.10)

## 2012-11-04 MED ORDER — KCL IN DEXTROSE-NACL 30-5-0.45 MEQ/L-%-% IV SOLN
INTRAVENOUS | Status: DC
  Administered 2012-11-04 – 2012-11-05 (×5): via INTRAVENOUS
  Administered 2012-11-06: 125 mL via INTRAVENOUS
  Filled 2012-11-04 (×11): qty 1000

## 2012-11-04 NOTE — Progress Notes (Signed)
Unit CM UR Completed by [facility] ED CM  W. Paulla Mcclaskey RN  

## 2012-11-04 NOTE — Progress Notes (Signed)
Patient ID: Kelly Walker, female   DOB: [birth year], 67 y.o.   MRN: [id number]  General Surgery - [facility] - Progress Note  HD# 2  Subjective: Patient feels much improved this AM.  Denies pain.  Loose BM last night, small BM this AM.  NG in place.  Objective: Vital signs in last 24 hours: Temp:  [97.6 F (36.4 C)-98.6 F (37 C)] 98.6 F (37 C) (05/28 0600) Pulse Rate:  [64-92] 64 (05/28 0600) Resp:  [16-18] 18 (05/28 0600) BP: (126-142)/(79-98) 142/81 mmHg (05/28 0600) SpO2:  [93 %-96 %] 93 % (05/28 0600) Weight:  [214 lb (97.07 kg)-214 lb 8 oz (97.297 kg)] 214 lb (97.07 kg) (05/27 2004)    Intake/Output from previous day: 05/27 0701 - 05/28 0700 In: 2670.8 [I.V.:2670.8] Out: -   Exam: HEENT - clear, not icteric Neck - soft Chest - clear bilaterally Cor - RRR, no murmur Abd - soft, obese; no tenderness; well healed lower midline wound; no hernia Ext - no significant edema Neuro - grossly intact, no focal deficits  Lab Results:   Recent Labs  11/03/12 1205 11/04/12 0512  WBC 7.4 7.2  HGB 18.1* 13.4  HCT 50.6* 41.1  PLT 254 187     Recent Labs  11/03/12 1205 11/04/12 0512  NA 135 137  K 4.5 3.5  CL 93* 101  CO2 25 27  GLUCOSE 159* 109*  BUN 32* 20  CREATININE 1.66* 0.89  CALCIUM 10.8* 8.4    Studies/Results: US Abdomen Complete  11/03/2012   *RADIOLOGY REPORT*  Clinical Data:  Abdominal pain, abnormal LFTs  COMPLETE ABDOMINAL ULTRASOUND  Comparison:  None.  Findings:  Gallbladder:  Multiple layering gallstones.  No gallbladder wall thickening or pericholecystic fluid.  Negative sonographic Murphy's sign.  Common bile duct:  Measures 6 mm.  Liver:  Mildly nodular hepatic contour, nonspecific.  No focal lesion identified.  At the upper limits of normal for parenchymal echogenicity.  IVC:  Appears normal.  Pancreas:  Not visualized due to overlying bowel gas.  Spleen:  Measures 7.1 cm.  Right Kidney:  Measures 10.5 cm.  No mass or  hydronephrosis.  Left Kidney:  Measures 11.0 cm.  No mass or hydronephrosis.  Abdominal aorta:  No aneurysm identified.  IMPRESSION: Cholelithiasis, without associated findings to suggest acute cholecystitis.  Mildly nodular hepatic contour, nonspecific.  Very early/mild cirrhosis is possible.   Original Report Authenticated By: Charline Bills, M.D.   Ct Abdomen Pelvis W Contrast  11/03/2012   *RADIOLOGY REPORT*  Clinical Data: Abdominal pain.  Nausea and vomiting.  Prior hysterectomy and appendectomy.  CT ABDOMEN AND PELVIS WITH CONTRAST  Technique:  Multidetector CT imaging of the abdomen and pelvis was performed following the standard protocol during bolus administration of intravenous contrast.  Contrast: 50mL OMNIPAQUE IOHEXOL 300 MG/ML  SOLN, OMNIPAQUE IOHEXOL 300 MG/ML  SOLN  Comparison: ultrasound of 11/03/2012.  Findings: The liver, spleen, pancreas, and adrenal glands appear unremarkable.  Numerous gallstones measure up to 1.4 cm in diameter settled dependently in the gallbladder, without gallbladder wall thickening or pericholecystic fluid.  No biliary dilatation.  The kidneys appear unremarkable, as do the proximal ureters.  Abnormal moderately dilated small bowel with progressive dilution of the contrast column extends to the abrupt transition point in the right lower quadrant where there is an angulated loop of bowel potentially with some mild twisting around its mesentery on image 29 of series 5.  Low-level edema observed in the mesentery.  A small  ventral hernia to the right of the umbilicus contains adipose tissue and a trace amount of fluid/stranding.  Sigmoid colon diverticulosis is present without active diverticulitis.  Uterus absent.  Urinary bladder unremarkable. Fatty prominence of the ileocecal valve noted.  Scattered air-fluid levels are present in the colon.  There is degenerative arthropathy of both hips, right greater than left.  Degenerative grade 1 retrolisthesis at L5-S1  noted with intervertebral and facet spurring at this level.  IMPRESSION:  1.  Small bowel obstruction, transition point right lower quadrant, with a twisted or angulated loop of bowel at the transition point. Edema tracts along the mesentery. Surgical consultation recommended. 2.  Sigmoid diverticulosis. 3.  Cholelithiasis. 4.  Degenerative arthropathy of both hips. 5.  Lower lumbar spondylosis and degenerative disc disease.   Original Report Authenticated By: Gaylyn Rong, M.D.   Dg Abd 2 Views  11/04/2012   *RADIOLOGY REPORT*  Clinical Data: Follow up small bowel obstruction.  ABDOMEN - 2 VIEW  Comparison: 11/03/2012  Findings: Nasogastric tube tip is in the distal stomach.On the upright images there are persistent dilated loops of small bowel measuring up to 4.1 cm.  This is not significantly improved from previous exam.  The enteric contrast material has progressed through the small bowel and into the colon up to the rectum.  No free intraperitoneal air identified.  IMPRESSION:  1.  High-grade partial small bowel obstruction.  This is not significantly improved from previous exam.   Original Report Authenticated By: Signa Kell, M.D.    Assessment / Plan: 1.  SBO likely secondary to adhesions  AXR with persistent dilated loops of small bowel, but contrast through to rectum  Clinically improved with no discomfort  Encouraged ambulation, OOB  IV hydration  Continue NG tube  Repeat AXR in AM 5/29  Will hold off on operative intervention for now - patient agrees with plans  Velora Heckler, MD, [facility] Office: [phone]  11/04/2012

## 2012-11-05 ENCOUNTER — Inpatient Hospital Stay (HOSPITAL_COMMUNITY): Payer: Medicare Other

## 2012-11-05 LAB — BASIC METABOLIC PANEL
CO2: 27 mEq/L (ref 19–32)
Calcium: 8.1 mg/dL — ABNORMAL LOW (ref 8.4–10.5)
Chloride: 105 mEq/L (ref 96–112)
Glucose, Bld: 132 mg/dL — ABNORMAL HIGH (ref 70–99)
Potassium: 3.6 mEq/L (ref 3.5–5.1)
Sodium: 137 mEq/L (ref 135–145)

## 2012-11-05 NOTE — Progress Notes (Signed)
  Subjective: Pt feeling better.  Denies n/v.  Has had several large bowel movements, +flatus.  NGT off suction.  Has been up in hallways walking.  Husband at bedise.  Objective: Vital signs in last 24 hours: Temp:  [97.8 F (36.6 C)-99 F (37.2 C)] 97.8 F (36.6 C) November 15, 2022 0604) Pulse Rate:  [70-109] 70 15-Nov-2022 0604) Resp:  [16-20] 18 15-Nov-2022 0604) BP: (98-124)/(65-78) 124/78 mmHg 11/15/22 0604) SpO2:  [93 %-94 %] 93 % 15-Nov-2022 0604) Last BM Date: 14-Nov-2012  Intake/Output from previous day: 05/28 0701 - 11/15/22 0700 In: 3362.5 [I.V.:3362.5] Out: -  Intake/Output this shift:    General appearance: alert, cooperative, appears stated age and no distress Resp: clear to auscultation bilaterally and no wheezes, crackles or chest wall tenderness Cardio: regular rate and rhythm, S1, S2 normal, no murmur, click, rub or gallop GI: soft, non-tender; bowel sounds normal; no masses,  no organomegaly.  No signs or peritonitis.   Skin: no erythema or rash.  No cyanosis or edema. Lab Results:   Recent Labs  11/03/12 1205 11/04/12 0512  WBC 7.4 7.2  HGB 18.1* 13.4  HCT 50.6* 41.1  PLT 254 187   BMET  Recent Labs  11/04/12 0512 11-14-12 0426  NA 137 137  K 3.5 3.6  CL 101 105  CO2 27 27  GLUCOSE 109* 132*  BUN 20 10  CREATININE 0.89 0.71  CALCIUM 8.4 8.1*   Studies/Results: Dg Abd 2 Views  2012/11/14   *RADIOLOGY REPORT*  Clinical Data: Follow up SBO  ABDOMEN - 2 VIEW  Comparison: 10/27/2012  Findings: Enteric tube in the antral pyloric region.  Multiple dilated loops of small bowel in the central abdomen measuring up to 4.6 cm.  Residual contrast within the left colon. Right colon is not decompressed.  These findings are compatible with high-grade partial small bowel obstruction, unchanged.  No evidence of free air under the diaphragm on the upright view.  Degenerative changes of the visualized thoracolumbar spine.  IMPRESSION: Stable high grade partial small bowel obstruction.    Original Report Authenticated By: Charline Bills, M.D.   Assessment/Plan: Small Bowel Obstruction -like from adhesions(hx hysto) -she is clinically improving.  Abdomen is soft and nontender, having bowel movements.  -DC NGT -start clear liquid diet and see if she is able to tolerate. -will continue to follow  VTE Prophylaxis -SCDs, ambulate, heparin.   LOS: 2 days    Bonner Puna [facility] ANP-BC Pager [phone] 14-Nov-2012

## 2012-11-05 NOTE — Progress Notes (Signed)
General Surgery [facility]  Patient seen and examined.  NG removed and started on clear liquids.  Ambulating.  Will advance diet as tolerated.  If symptoms recur, will need laparotomy.  Discussed with patient at length and she understands and agrees with plans.  Velora Heckler, MD, [facility] Office: [phone]

## 2012-11-06 ENCOUNTER — Encounter (HOSPITAL_COMMUNITY): Payer: Self-pay | Admitting: General Surgery

## 2012-11-06 DIAGNOSIS — K56609 Unspecified intestinal obstruction, unspecified as to partial versus complete obstruction: Secondary | ICD-10-CM

## 2012-11-06 HISTORY — DX: Unspecified intestinal obstruction, unspecified as to partial versus complete obstruction: K56.609

## 2012-11-06 NOTE — Discharge Summary (Signed)
General Surgery [facility]  Patient did well with diet advancement without recurrence of symptoms.  Discharge home today.  Velora Heckler, MD, [facility] Office: [phone]

## 2012-11-06 NOTE — Progress Notes (Signed)
General Surgery [facility]  Reviewed and agree with plans for advancement of diet.  Likely home later today.  Velora Heckler, MD, [facility] Office: [phone]

## 2012-11-06 NOTE — Discharge Summary (Signed)
Physician Discharge Summary  Patient ID: Fanchon Papania MRN: [id number] DOB/AGE: [birth year] 67 y.o.  Admit date: 11/03/2012 Discharge date: 11/06/2012  Admission Diagnoses:  Small Bowel Obstruction, with hx of hysterectomy, appendectomy and umbilical hernia repair  Dyslipidemia  Gout  Diverticulosis   Discharge Diagnoses:  Same Principal Problem:   SBO (small bowel obstruction)   PROCEDURES: None  Hospital Course: patient is a pleasant 67 year old female who was in her usual state of health until 3 days ago. At that time she developed the fairly rapid onset of abdominal pain. She describes severe pressure-like pain in her mid abdomen like "labor pains". This was followed soon thereafter by nausea and vomiting. She has had continued bilious vomiting. She has been trying to drink fluids for 2 days but then will develop increased pressure and recurrent vomiting. She states the pain became better after about a day but would recur when she tried to drink. She did have a small bowel movement yesterday and this morning but has had a couple of larger loose bowel movements since coming to the emergency room today. She has had an NG tube placed. She currently denies any pain or nausea or other complaints. She has no similar chronic symptoms. She does have some irritable bowel like syndromes with certain foods causing diarrhea and urgency of bowel movements. Due to persistent vomiting and feeling that she was getting weak and dehydrated she presented today to urgent care center and was sent to the emergency room for further evaluation and treatment.  Work up in the ER revealed SBO with transition point in the RLQ, cholelithiasis, degenerative arthropathy, and Lower lumbar spondylosis and degenerative disc disease. She was admitted, place on bowel rest, rehydration and NG decompression.  She turned around in the next 48 hours, her diet was advanced, her NG removed and sent home the following day 11/06/12.  I  sent her home on a low residual diet for the next 2 weeks.  Follow up with primary care or our office if she has an issue.   Disposition: Final discharge disposition not confirmed   Future Appointments Provider Department Dept Phone   02/18/2013 1:00 PM Kendrick Ranch, MD [facility] [place] [phone]       Medication List    TAKE these medications       acetaminophen 325 MG tablet  Commonly known as:  TYLENOL  Take 650 mg by mouth every 6 (six) hours as needed.           Follow-up Information   Call SCHOENHOFF,DEBBIE, MD. (As needed)    Contact information:   [address] [place] [address] [phone]       Follow up with CCS,MD, MD. (If you have recurrent symptoms we can see you back in the Emergency Room.  You do not have to follow up in our clinic since we did not do any surgery.)    Contact information:   [address] [place] [address] [phone]       Signed: Sherrie George 11/06/2012, 2:35 PM

## 2012-11-06 NOTE — Progress Notes (Signed)
  Subjective: Feels better having allot of loose stools last PM. Tolerating clear liquids well.  Objective: Vital signs in last 24 hours: Temp:  [97.9 F (36.6 C)-98 F (36.7 C)] 98 F (36.7 C) (05/30 0623) Pulse Rate:  [70-82] 82 (05/30 0623) Resp:  [16-18] 18 (05/30 0623) BP: (126-150)/(70-84) 150/84 mmHg (05/30 0623) SpO2:  [93 %-98 %] 93 % (05/30 0623) Last BM Date: 2012-11-14 Diet: clears Afebrile,  VSS No labs/film  Intake/Output from previous day: 2022/11/15 0701 - 05/30 0700 In: 1500 [I.V.:1500] Out: 25 [Emesis/NG output:25] Intake/Output this shift:    General appearance: alert, cooperative and no distress Resp: clear to auscultation bilaterally GI: soft, non-tender; bowel sounds normal; no masses,  no organomegaly  Lab Results:   Recent Labs  11/03/12 1205 11/04/12 0512  WBC 7.4 7.2  HGB 18.1* 13.4  HCT 50.6* 41.1  PLT 254 187    BMET  Recent Labs  11/04/12 0512 November 14, 2012 0426  NA 137 137  K 3.5 3.6  CL 101 105  CO2 27 27  GLUCOSE 109* 132*  BUN 20 10  CREATININE 0.89 0.71  CALCIUM 8.4 8.1*   PT/INR No results found for this basename: LABPROT, INR,  in the last 72 hours   Recent Labs Lab 11/03/12 1205  AST 111*  ALT 174*  ALKPHOS 125*  BILITOT 1.8*  PROT 9.1*  ALBUMIN 4.3     Lipase     Component Value Date/Time   LIPASE 56 11/03/2012 1205     Studies/Results: Dg Abd 2 Views  2012/11/14   *RADIOLOGY REPORT*  Clinical Data: Follow up SBO  ABDOMEN - 2 VIEW  Comparison: 10/27/2012  Findings: Enteric tube in the antral pyloric region.  Multiple dilated loops of small bowel in the central abdomen measuring up to 4.6 cm.  Residual contrast within the left colon. Right colon is not decompressed.  These findings are compatible with high-grade partial small bowel obstruction, unchanged.  No evidence of free air under the diaphragm on the upright view.  Degenerative changes of the visualized thoracolumbar spine.  IMPRESSION: Stable high grade  partial small bowel obstruction.   Original Report Authenticated By: Charline Bills, M.D.    Medications: . heparin  5,000 Units Subcutaneous Q8H    Assessment/Plan Small Bowel Obstruction, with hx of hysterectomy, appendectomy and umbilical hernia repair Dyslipidemia Gout Diverticulosis   Plan:  Advance diet, see how she does.  If doing well home later today.   LOS: 3 days    July Linam 11/06/2012

## 2013-01-13 ENCOUNTER — Other Ambulatory Visit: Payer: Self-pay

## 2013-02-09 ENCOUNTER — Encounter: Payer: BC Managed Care – PPO | Admitting: Internal Medicine

## 2013-02-18 ENCOUNTER — Ambulatory Visit (INDEPENDENT_AMBULATORY_CARE_PROVIDER_SITE_OTHER): Payer: Medicare Other | Admitting: Internal Medicine

## 2013-02-18 ENCOUNTER — Encounter: Payer: Self-pay | Admitting: Internal Medicine

## 2013-02-18 VITALS — BP 136/88 | HR 70 | Temp 97.2°F | Resp 20 | Wt 210.0 lb

## 2013-02-18 DIAGNOSIS — E2839 Other primary ovarian failure: Secondary | ICD-10-CM | POA: Insufficient documentation

## 2013-02-18 DIAGNOSIS — E785 Hyperlipidemia, unspecified: Secondary | ICD-10-CM

## 2013-02-18 DIAGNOSIS — R7989 Other specified abnormal findings of blood chemistry: Secondary | ICD-10-CM

## 2013-02-18 DIAGNOSIS — N951 Menopausal and female climacteric states: Secondary | ICD-10-CM

## 2013-02-18 DIAGNOSIS — Z139 Encounter for screening, unspecified: Secondary | ICD-10-CM

## 2013-02-18 DIAGNOSIS — R1013 Epigastric pain: Secondary | ICD-10-CM

## 2013-02-18 DIAGNOSIS — Z860100 Personal history of colon polyps, unspecified: Secondary | ICD-10-CM | POA: Insufficient documentation

## 2013-02-18 DIAGNOSIS — R030 Elevated blood-pressure reading, without diagnosis of hypertension: Secondary | ICD-10-CM

## 2013-02-18 DIAGNOSIS — E790 Hyperuricemia without signs of inflammatory arthritis and tophaceous disease: Secondary | ICD-10-CM | POA: Insufficient documentation

## 2013-02-18 DIAGNOSIS — Z780 Asymptomatic menopausal state: Secondary | ICD-10-CM

## 2013-02-18 DIAGNOSIS — M1990 Unspecified osteoarthritis, unspecified site: Secondary | ICD-10-CM

## 2013-02-18 DIAGNOSIS — J309 Allergic rhinitis, unspecified: Secondary | ICD-10-CM

## 2013-02-18 DIAGNOSIS — M129 Arthropathy, unspecified: Secondary | ICD-10-CM

## 2013-02-18 DIAGNOSIS — Z0000 Encounter for general adult medical examination without abnormal findings: Secondary | ICD-10-CM

## 2013-02-18 DIAGNOSIS — K3189 Other diseases of stomach and duodenum: Secondary | ICD-10-CM

## 2013-02-18 LAB — COMPREHENSIVE METABOLIC PANEL
Albumin: 4.7 g/dL (ref 3.5–5.2)
BUN: 17 mg/dL (ref 6–23)
CO2: 26 mEq/L (ref 19–32)
Calcium: 10.1 mg/dL (ref 8.4–10.5)
Chloride: 104 mEq/L (ref 96–112)
Glucose, Bld: 102 mg/dL — ABNORMAL HIGH (ref 70–99)
Potassium: 5.1 mEq/L (ref 3.5–5.3)
Sodium: 139 mEq/L (ref 135–145)
Total Protein: 7.3 g/dL (ref 6.0–8.3)

## 2013-02-18 LAB — CBC WITH DIFFERENTIAL/PLATELET
Hemoglobin: 14.5 g/dL (ref 12.0–15.0)
Lymphs Abs: 1.9 10*3/uL (ref 0.7–4.0)
Monocytes Relative: 7 % (ref 3–12)
Neutro Abs: 3.3 10*3/uL (ref 1.7–7.7)
Neutrophils Relative %: 57 % (ref 43–77)
Platelets: 273 10*3/uL (ref 150–400)
RBC: 5.04 MIL/uL (ref 3.87–5.11)
WBC: 5.9 10*3/uL (ref 4.0–10.5)

## 2013-02-18 LAB — LIPID PANEL
Cholesterol: 198 mg/dL (ref 0–200)
HDL: 64 mg/dL (ref >39)
Total CHOL/HDL Ratio: 3.1 Ratio

## 2013-02-18 LAB — TSH: TSH: 1.569 u[IU]/mL (ref 0.350–4.500)

## 2013-02-18 NOTE — Patient Instructions (Addendum)
Allergist  Dr. Barnetta Chapel or Dr. Irena Cords  [place] [facility]  [phone]  Will set up referral with arthritis specialist  See me in 4-6 weeks

## 2013-02-18 NOTE — Progress Notes (Signed)
Subjective:    Patient ID: Kelly Walker, female    DOB: 09/05/1945, 67 y.o.   MRN: [id number]  HPI Kelly Walker is here for CPE.  She has just returned from 3 months of travelling throught [place] and [place]  Prior to her trip she was hospitalized for SBO which responded within 48 hours to N/G decompression and she was discharged.    See uric acid  :  She has evidence of gouty nodules in her hands.  Never been treated for gout.  Father has gout  She is concerned about allergies and would like a referral.  She would like to be tested for Celiac .  Certain foods give her diarrhea  She declines pneurmovax but would like a DExa test  She has had hyst and BSO for benign reasons - endometriosis  Allergies  Allergen Reactions  . Sulfa Antibiotics     Joint pain  . Ciprofloxacin Anxiety  . Flagyl [Metronidazole] Anxiety   Past Medical History  Diagnosis Date  . Menopause   . Hyperlipidemia   . Diverticulosis   . Gout   . SBO (small bowel obstruction) 11/06/2012  . Small bowel obstruction    Past Surgical History  Procedure Laterality Date  . Tonsillectomy  1957  . Abdominal hysterectomy  2001  . Appendectomy  2001  . Umbilical hernia repair  2001   History   Social History  . Marital Status: Married    Spouse Name: N/A    Number of Children: N/A  . Years of Education: N/A   Occupational History  . Not on file.   Social History Main Topics  . Smoking status: Former Smoker -- 1.00 packs/day for 4 years    Quit date: 06/10/1972  . Smokeless tobacco: Never Used  . Alcohol Use: Yes     Comment: occasional  . Drug Use: No  . Sexual Activity: Yes   Other Topics Concern  . Not on file   Social History Narrative  . No narrative on file   Family History  Problem Relation Age of Onset  . Heart failure Mother   . Diabetes Mother   . Heart attack Father   . Colon cancer Father 58  . Colon cancer Paternal Uncle 36  . Stomach cancer Cousin    Patient Active Problem List    Diagnosis Date Noted  . Estrogen deficiency 02/18/2013  . Elevated uric acid in blood 02/18/2013  . SBO (small bowel obstruction) 11/06/2012  . DJD (degenerative joint disease) 08/06/2011  . Obesity 08/06/2011  . Family history of colon cancer requiring screening colonoscopy 08/06/2011  . Elevated blood pressure 07/24/2011  . Hyperlipidemia   . Menopause   . Diverticulosis    Current Outpatient Prescriptions on File Prior to Visit  Medication Sig Dispense Refill  . acetaminophen (TYLENOL) 325 MG tablet Take 650 mg by mouth every 6 (six) hours as needed.       No current facility-administered medications on file prior to visit.       Review of Systems  All other systems reviewed and are negative.       Objective:   Physical Exam Physical Exam  Nursing note and vitals reviewed.  Constitutional: She is oriented to person, place, and time. She appears well-developed and well-nourished.  HENT:  Head: Normocephalic and atraumatic.  Right Ear: Tympanic membrane and ear canal normal. No drainage. Tympanic membrane is not injected and not erythematous.  Left Ear: Tympanic membrane and ear canal normal. No drainage. Tympanic  membrane is not injected and not erythematous.  Nose: Nose normal. Right sinus exhibits no maxillary sinus tenderness and no frontal sinus tenderness. Left sinus exhibits no maxillary sinus tenderness and no frontal sinus tenderness.  Mouth/Throat: Oropharynx is clear and moist. No oral lesions. No oropharyngeal exudate.  Eyes: Conjunctivae and EOM are normal. Pupils are equal, round, and reactive to light.  Neck: Normal range of motion. Neck supple. No JVD present. Carotid bruit is not present. No mass and no thyromegaly present.  Cardiovascular: Normal rate, regular rhythm, S1 normal, S2 normal and intact distal pulses. Exam reveals no gallop and no friction rub.  No murmur heard.  Pulses:  Carotid pulses are 2+ on the right side, and 2+ on the left side.   Dorsalis pedis pulses are 2+ on the right side, and 2+ on the left side.  No carotid bruit. No LE edema  Pulmonary/Chest: Breath sounds normal. She has no wheezes. She has no rales. She exhibits no tenderness.  Breast no discrete masses no nipple discharge no axillary adenopathy Abdominal: Soft. Bowel sounds are normal. She exhibits no distension and no mass. There is no hepatosplenomegaly. There is no tenderness. There is no CVA tenderness.  Musculoskeletal: Normal range of motion.  No active synovitis to joints.  She has  Calcified nodules over multiple hand joints Lymphadenopathy:  She has no cervical adenopathy.  She has no axillary adenopathy.  Right: No inguinal and no supraclavicular adenopathy present.  Left: No inguinal and no supraclavicular adenopathy present.  Neurological: She is alert and oriented to person, place, and time. She has normal strength and normal reflexes. She displays no tremor. No cranial nerve deficit or sensory deficit. Coordination and gait normal.  Skin: Skin is warm and dry. No rash noted. No cyanosis. Nails show no clubbing.  Psychiatric: She has a normal mood and affect. Her speech is normal and behavior is normal. Cognition and memory are normal.           Assessment & Plan:  Health maintenance  Will schedule dexa with 3-D mm. She declines pneumovax  Advised influenza.  Colonoscopy due 2018  DJD /  Elevated uric acid.   Will refer to rhuematology for further eval suspected gout clinically  Dyspepsia  Will check celiac panel  Allergies will refer to allergist  Colonic adenoma  See above

## 2013-02-19 ENCOUNTER — Telehealth: Payer: Self-pay | Admitting: *Deleted

## 2013-02-19 LAB — VITAMIN D 25 HYDROXY (VIT D DEFICIENCY, FRACTURES): Vit D, 25-Hydroxy: 30 ng/mL (ref 30–89)

## 2013-02-19 LAB — GLIA (IGA/G) + TTG IGA
Gliadin IgA: 3.2 U/mL (ref <20)
Tissue Transglutaminase Ab, IgA: 3.1 U/mL (ref <20)

## 2013-02-19 NOTE — Telephone Encounter (Signed)
Office notes demographics and insurance info faxed to Dr Fatima Sanger office

## 2013-03-22 ENCOUNTER — Ambulatory Visit: Payer: Medicare Other

## 2013-03-22 ENCOUNTER — Other Ambulatory Visit: Payer: Medicare Other

## 2013-03-25 ENCOUNTER — Telehealth: Payer: Self-pay | Admitting: *Deleted

## 2013-03-29 NOTE — Telephone Encounter (Signed)
error 

## 2013-04-06 ENCOUNTER — Ambulatory Visit
Admission: RE | Admit: 2013-04-06 | Discharge: 2013-04-06 | Disposition: A | Discharge status: Home / Self Care | Payer: Medicare Other | Source: Ambulatory Visit | Attending: Internal Medicine | Admitting: Internal Medicine

## 2013-04-06 DIAGNOSIS — Z139 Encounter for screening, unspecified: Secondary | ICD-10-CM

## 2013-04-06 DIAGNOSIS — E2839 Other primary ovarian failure: Secondary | ICD-10-CM

## 2013-04-09 ENCOUNTER — Other Ambulatory Visit: Payer: Self-pay | Admitting: Internal Medicine

## 2013-04-09 DIAGNOSIS — R928 Other abnormal and inconclusive findings on diagnostic imaging of breast: Secondary | ICD-10-CM

## 2013-04-19 ENCOUNTER — Encounter: Payer: Self-pay | Admitting: Internal Medicine

## 2013-04-19 ENCOUNTER — Telehealth: Payer: Self-pay | Admitting: *Deleted

## 2013-04-19 DIAGNOSIS — Z9289 Personal history of other medical treatment: Secondary | ICD-10-CM | POA: Insufficient documentation

## 2013-04-19 NOTE — Telephone Encounter (Signed)
See Heather's note regarding results

## 2013-04-19 NOTE — Telephone Encounter (Signed)
letl pt know her bone density is normal but the radiology dept needs to get further imaging.  Did she hear from them?  Give her number to call radiology if she has not heard from them

## 2013-04-19 NOTE — Telephone Encounter (Signed)
Went to breast center had mm and bone density and would like results.

## 2013-04-20 ENCOUNTER — Encounter: Payer: Self-pay | Admitting: *Deleted

## 2013-04-28 ENCOUNTER — Ambulatory Visit
Admission: RE | Admit: 2013-04-28 | Discharge: 2013-04-28 | Disposition: A | Discharge status: Home / Self Care | Payer: Medicare Other | Source: Ambulatory Visit | Attending: Internal Medicine | Admitting: Internal Medicine

## 2013-04-28 DIAGNOSIS — R928 Other abnormal and inconclusive findings on diagnostic imaging of breast: Secondary | ICD-10-CM

## 2014-03-14 ENCOUNTER — Other Ambulatory Visit: Payer: Self-pay | Admitting: *Deleted

## 2014-03-14 DIAGNOSIS — Z0000 Encounter for general adult medical examination without abnormal findings: Secondary | ICD-10-CM

## 2014-03-14 DIAGNOSIS — E785 Hyperlipidemia, unspecified: Secondary | ICD-10-CM

## 2014-03-14 DIAGNOSIS — I10 Essential (primary) hypertension: Secondary | ICD-10-CM

## 2014-03-15 LAB — CBC WITH DIFFERENTIAL/PLATELET
BASOS PCT: 0 % (ref 0–1)
Basophils Absolute: 0 10*3/uL (ref 0.0–0.1)
EOS ABS: 0.2 10*3/uL (ref 0.0–0.7)
Eosinophils Relative: 4 % (ref 0–5)
HCT: 43.5 % (ref 36.0–46.0)
Hemoglobin: 14.6 g/dL (ref 12.0–15.0)
Lymphocytes Relative: 31 % (ref 12–46)
Lymphs Abs: 1.7 10*3/uL (ref 0.7–4.0)
MCH: 28.2 pg (ref 26.0–34.0)
MCHC: 33.6 g/dL (ref 30.0–36.0)
MCV: 84.1 fL (ref 78.0–100.0)
Monocytes Absolute: 0.4 10*3/uL (ref 0.1–1.0)
Monocytes Relative: 7 % (ref 3–12)
NEUTROS ABS: 3.2 10*3/uL (ref 1.7–7.7)
NEUTROS PCT: 58 % (ref 43–77)
Platelets: 232 10*3/uL (ref 150–400)
RBC: 5.17 MIL/uL — ABNORMAL HIGH (ref 3.87–5.11)
RDW: 13.7 % (ref 11.5–15.5)
WBC: 5.6 10*3/uL (ref 4.0–10.5)

## 2014-03-15 LAB — COMPLETE METABOLIC PANEL WITH GFR
ALBUMIN: 4 g/dL (ref 3.5–5.2)
ALK PHOS: 88 U/L (ref 39–117)
ALT: 32 U/L (ref 0–35)
AST: 21 U/L (ref 0–37)
BILIRUBIN TOTAL: 0.6 mg/dL (ref 0.2–1.2)
BUN: 15 mg/dL (ref 6–23)
CO2: 25 mEq/L (ref 19–32)
Calcium: 9.3 mg/dL (ref 8.4–10.5)
Chloride: 105 mEq/L (ref 96–112)
Creat: 0.79 mg/dL (ref 0.50–1.10)
GFR, Est African American: >89 mL/min
GFR, Est Non African American: 78 mL/min
Glucose, Bld: 112 mg/dL — ABNORMAL HIGH (ref 70–99)
Potassium: 4.6 mEq/L (ref 3.5–5.3)
SODIUM: 139 meq/L (ref 135–145)
TOTAL PROTEIN: 7 g/dL (ref 6.0–8.3)

## 2014-03-15 LAB — LIPID PANEL
CHOLESTEROL: 193 mg/dL (ref 0–200)
HDL: 68 mg/dL (ref >39)
LDL Cholesterol: 105 mg/dL — ABNORMAL HIGH (ref 0–99)
Total CHOL/HDL Ratio: 2.8 Ratio
Triglycerides: 102 mg/dL (ref <150)
VLDL: 20 mg/dL (ref 0–40)

## 2014-03-15 LAB — TSH: TSH: 1.845 u[IU]/mL (ref 0.350–4.500)

## 2014-03-16 LAB — VITAMIN D 25 HYDROXY (VIT D DEFICIENCY, FRACTURES): VIT D 25 HYDROXY: 28 ng/mL — AB (ref 30–89)

## 2014-03-17 ENCOUNTER — Encounter: Payer: Self-pay | Admitting: Internal Medicine

## 2014-03-17 ENCOUNTER — Ambulatory Visit (INDEPENDENT_AMBULATORY_CARE_PROVIDER_SITE_OTHER): Payer: Medicare Other | Admitting: Internal Medicine

## 2014-03-17 VITALS — BP 136/84 | HR 87 | Resp 16 | Ht 66.0 in | Wt 227.0 lb

## 2014-03-17 DIAGNOSIS — Z0000 Encounter for general adult medical examination without abnormal findings: Secondary | ICD-10-CM

## 2014-03-17 DIAGNOSIS — E785 Hyperlipidemia, unspecified: Secondary | ICD-10-CM

## 2014-03-17 DIAGNOSIS — E559 Vitamin D deficiency, unspecified: Secondary | ICD-10-CM

## 2014-03-17 DIAGNOSIS — K5710 Diverticulosis of small intestine without perforation or abscess without bleeding: Secondary | ICD-10-CM

## 2014-03-17 DIAGNOSIS — M159 Polyosteoarthritis, unspecified: Secondary | ICD-10-CM

## 2014-03-17 DIAGNOSIS — M150 Primary generalized (osteo)arthritis: Secondary | ICD-10-CM

## 2014-03-17 DIAGNOSIS — E669 Obesity, unspecified: Secondary | ICD-10-CM

## 2014-03-17 NOTE — Progress Notes (Signed)
Subjective:    Patient ID: Kelly Walker, female    DOB: [birth year], 68 y.o.   MRN: [id number]  HPI   Recardo Evangelist is here for CPE  HM:  Declines flu vaccine  S/P hysterectomy,   Colonoscopy done 2013,   DExa due next year.    MM due 10/28   Smoked for 3 years only   She reports the arthritis in both hands continue to bother her.  She has seen rheumatology for this.    Take rare NSAID  Hyperlipidemia  She is controlling with diet.   Vitamin D slightly low    Allergies  Allergen Reactions  . Sulfa Antibiotics     Joint pain  . Ciprofloxacin Anxiety  . Flagyl [Metronidazole] Anxiety   Past Medical History  Diagnosis Date  . Menopause   . Hyperlipidemia   . Diverticulosis   . Gout   . SBO (small bowel obstruction) 11/06/2012  . Small bowel obstruction    Past Surgical History  Procedure Laterality Date  . Tonsillectomy  1957  . Abdominal hysterectomy  2001  . Appendectomy  2001  . Umbilical hernia repair  2001   History   Social History  . Marital Status: Married    Spouse Name: N/A    Number of Children: N/A  . Years of Education: N/A   Occupational History  . Not on file.   Social History Main Topics  . Smoking status: Former Smoker -- 1.00 packs/day for 4 years    Quit date: 06/10/1972  . Smokeless tobacco: Never Used  . Alcohol Use: Yes     Comment: occasional  . Drug Use: No  . Sexual Activity: Yes   Other Topics Concern  . Not on file   Social History Narrative  . No narrative on file   Family History  Problem Relation Age of Onset  . Heart failure Mother   . Diabetes Mother   . Heart attack Father   . Colon cancer Father 52  . Colon cancer Paternal Uncle 67  . Stomach cancer Cousin    Patient Active Problem List   Diagnosis Date Noted  . History of bone density study 04/19/2013  . Estrogen deficiency 02/18/2013  . Elevated uric acid in blood 02/18/2013  . Personal history of colonic polyps 02/18/2013  . SBO (small bowel obstruction)  11/06/2012  . DJD (degenerative joint disease) 08/06/2011  . Obesity 08/06/2011  . Family history of colon cancer requiring screening colonoscopy 08/06/2011  . Elevated blood pressure 07/24/2011  . Hyperlipidemia   . Menopause   . Diverticulosis    Current Outpatient Prescriptions on File Prior to Visit  Medication Sig Dispense Refill  . acetaminophen (TYLENOL) 325 MG tablet Take 650 mg by mouth every 6 (six) hours as needed.       No current facility-administered medications on file prior to visit.       Review of Systems  Respiratory: Negative for cough, chest tightness and shortness of breath.   Cardiovascular: Negative for chest pain, palpitations and leg swelling.  Gastrointestinal: Negative for abdominal pain.       Objective:   Physical Exam Physical Exam  Nursing note and vitals reviewed.  Constitutional: She is oriented to person, place, and time. She appears well-developed and well-nourished.  HENT:  Head: Normocephalic and atraumatic.  Right Ear: Tympanic membrane and ear canal normal. No drainage. Tympanic membrane is not injected and not erythematous.  Left Ear: Tympanic membrane and ear canal normal. No drainage.  Tympanic membrane is not injected and not erythematous.  Nose: Nose normal. Right sinus exhibits no maxillary sinus tenderness and no frontal sinus tenderness. Left sinus exhibits no maxillary sinus tenderness and no frontal sinus tenderness.  Mouth/Throat: Oropharynx is clear and moist. No oral lesions. No oropharyngeal exudate.  Eyes: Conjunctivae and EOM are normal. Pupils are equal, round, and reactive to light.  Neck: Normal range of motion. Neck supple. No JVD present. Carotid bruit is not present. No mass and no thyromegaly present.  Cardiovascular: Normal rate, regular rhythm, S1 normal, S2 normal and intact distal pulses. Exam reveals no gallop and no friction rub.  No murmur heard.  Pulses:  Carotid pulses are 2+ on the right side, and 2+ on  the left side.  Dorsalis pedis pulses are 2+ on the right side, and 2+ on the left side.  No carotid bruit. No LE edema  Pulmonary/Chest: Breath sounds normal. She has no wheezes. She has no rales. She exhibits no tenderness.   Breast : no discrete masses no nipple discharge no axillary adneopathy bilaterally  Abdominal: Soft. Bowel sounds are normal. She exhibits no distension and no mass. There is no hepatosplenomegaly. There is no tenderness. There is no CVA tenderness.  Musculoskeletal: Normal range of motion.  No active synovitis to joints.  Lymphadenopathy:  She has no cervical adenopathy.  She has no axillary adenopathy.  Right: No inguinal and no supraclavicular adenopathy present.  Left: No inguinal and no supraclavicular adenopathy present.  Neurological: She is alert and oriented to person, place, and time. She has normal strength and normal reflexes. She displays no tremor. No cranial nerve deficit or sensory deficit. Coordination and gait normal.  Skin: Skin is warm and dry. No rash noted. No cyanosis. Nails show no clubbing.  Psychiatric: She has a normal mood and affect. Her speech is normal and behavior is normal. Cognition and memory are normal.           Assessment & Plan:  HM  Declines vaccines.   Does not meet criteria for lung ca screening.   colonosocpy due 2018,  Dexa due next year   Advised to get mm after 10/28  DJD  Ok for glucosamine chondrotitin  Hyperlipidemia   Diet controlled    Low vitamin D  Advised 1000 units daily   Obesity   Advised diet exercise

## 2014-03-20 ENCOUNTER — Encounter: Payer: Self-pay | Admitting: Internal Medicine

## 2014-03-20 DIAGNOSIS — M159 Polyosteoarthritis, unspecified: Secondary | ICD-10-CM | POA: Insufficient documentation

## 2014-03-20 NOTE — Patient Instructions (Signed)
See me as needed 

## 2014-03-21 ENCOUNTER — Encounter: Payer: Self-pay | Admitting: *Deleted

## 2014-03-21 NOTE — Progress Notes (Signed)
Mailed a copy of labs to patient-eh 

## 2014-03-25 ENCOUNTER — Other Ambulatory Visit: Payer: Self-pay

## 2014-04-11 ENCOUNTER — Encounter: Payer: Self-pay | Admitting: Internal Medicine

## 2014-12-05 ENCOUNTER — Other Ambulatory Visit: Payer: Self-pay

## 2016-07-03 ENCOUNTER — Encounter: Payer: Self-pay | Admitting: Gastroenterology

## 2016-09-20 DIAGNOSIS — M65342 Trigger finger, left ring finger: Secondary | ICD-10-CM | POA: Insufficient documentation

## 2016-09-20 DIAGNOSIS — M79645 Pain in left finger(s): Secondary | ICD-10-CM | POA: Insufficient documentation

## 2016-09-26 ENCOUNTER — Encounter: Payer: Self-pay | Admitting: Gastroenterology

## 2016-09-26 ENCOUNTER — Other Ambulatory Visit: Payer: Self-pay | Admitting: Family Medicine

## 2016-09-26 DIAGNOSIS — Z1231 Encounter for screening mammogram for malignant neoplasm of breast: Secondary | ICD-10-CM

## 2016-10-07 ENCOUNTER — Encounter: Payer: Self-pay | Admitting: Gastroenterology

## 2016-10-07 ENCOUNTER — Ambulatory Visit (AMBULATORY_SURGERY_CENTER): Payer: Self-pay | Admitting: *Deleted

## 2016-10-07 VITALS — Ht 65.0 in | Wt 204.0 lb

## 2016-10-07 DIAGNOSIS — Z860100 Personal history of colon polyps, unspecified: Secondary | ICD-10-CM

## 2016-10-07 DIAGNOSIS — Z800 Family history of malignant neoplasm of digestive organs: Secondary | ICD-10-CM

## 2016-10-07 NOTE — Progress Notes (Signed)
Patient denies any allergies to eggs or soy. Patient denies any problems with anesthesia/sedation. Patient denies any oxygen use at home and does not take any diet/weight loss medications. EMMI education assisgned to patient on colonoscopy, this was explained and instructions given to patient. Due to patient's allergies gave her Miralax/Dulcolax, unable to take Suprep.

## 2016-10-14 ENCOUNTER — Ambulatory Visit
Admission: RE | Admit: 2016-10-14 | Discharge: 2016-10-14 | Disposition: A | Discharge status: Home / Self Care | Payer: Medicare Other | Source: Ambulatory Visit | Attending: Family Medicine | Admitting: Family Medicine

## 2016-10-14 DIAGNOSIS — Z1231 Encounter for screening mammogram for malignant neoplasm of breast: Secondary | ICD-10-CM

## 2016-10-17 ENCOUNTER — Telehealth: Payer: Self-pay | Admitting: Gastroenterology

## 2016-10-17 NOTE — Telephone Encounter (Signed)
ok 

## 2016-10-21 ENCOUNTER — Encounter: Payer: Self-pay | Admitting: Gastroenterology

## 2017-09-26 ENCOUNTER — Emergency Department (HOSPITAL_COMMUNITY)
Admission: EM | Admit: 2017-09-26 | Discharge: 2017-09-26 | Disposition: A | Discharge status: Home / Self Care | Payer: Medicare Other | Attending: Emergency Medicine | Admitting: Emergency Medicine

## 2017-09-26 ENCOUNTER — Emergency Department (HOSPITAL_COMMUNITY): Payer: Medicare Other

## 2017-09-26 ENCOUNTER — Encounter (HOSPITAL_COMMUNITY): Payer: Self-pay | Admitting: Neurology

## 2017-09-26 DIAGNOSIS — I471 Supraventricular tachycardia: Secondary | ICD-10-CM

## 2017-09-26 DIAGNOSIS — Z87891 Personal history of nicotine dependence: Secondary | ICD-10-CM | POA: Diagnosis not present

## 2017-09-26 DIAGNOSIS — R000 Tachycardia, unspecified: Secondary | ICD-10-CM | POA: Diagnosis present

## 2017-09-26 LAB — CBC
HCT: 46.8 % — ABNORMAL HIGH (ref 36.0–46.0)
Hemoglobin: 15.6 g/dL — ABNORMAL HIGH (ref 12.0–15.0)
MCH: 29.5 pg (ref 26.0–34.0)
MCHC: 33.3 g/dL (ref 30.0–36.0)
MCV: 88.6 fL (ref 78.0–100.0)
Platelets: 262 10*3/uL (ref 150–400)
RBC: 5.28 MIL/uL — ABNORMAL HIGH (ref 3.87–5.11)
RDW: 13.3 % (ref 11.5–15.5)
WBC: 9 10*3/uL (ref 4.0–10.5)

## 2017-09-26 LAB — I-STAT TROPONIN, ED: Troponin i, poc: 0 ng/mL (ref 0.00–0.08)

## 2017-09-26 LAB — BASIC METABOLIC PANEL
Anion gap: 11 (ref 5–15)
BUN: 11 mg/dL (ref 6–20)
CO2: 22 mmol/L (ref 22–32)
Calcium: 9.9 mg/dL (ref 8.9–10.3)
Chloride: 106 mmol/L (ref 101–111)
Creatinine, Ser: 0.81 mg/dL (ref 0.44–1.00)
GFR calc Af Amer: >60 mL/min (ref >60)
GFR calc non Af Amer: >60 mL/min (ref >60)
Glucose, Bld: 148 mg/dL — ABNORMAL HIGH (ref 65–99)
Potassium: 4.1 mmol/L (ref 3.5–5.1)
Sodium: 139 mmol/L (ref 135–145)

## 2017-09-26 NOTE — ED Triage Notes (Signed)
Pt reports she felt like her HR was fast yesterday, she went to the doctors HR 150, they gave her a beta blocker pill, and it helped her immediately. She felt okay until 0730 today when she felt her heart start racing again. Denies CP or SOB. Is a x 4.

## 2017-09-26 NOTE — Discharge Instructions (Signed)
Take your metoprolol 25 mg every 12 hours.  If you develop lightheadedness or blood pressure goes less than 90.  Do not take further doses of metoprolol and lie down to prevent falling fainting.  Return to the emergency department for further episodes of racing heartbeat or if you feel worse for any reason.  Call Dr.Wolters on Monday, September 29, 2017 for follow-up.  Please tell her office staff that you were seen here so that she can gain access to your record.

## 2017-09-26 NOTE — ED Notes (Signed)
Pt stable, ambulatory, states understanding of discharge instructions 

## 2017-09-26 NOTE — ED Provider Notes (Signed)
[facility] EMERGENCY DEPARTMENT Provider Note   CSN: [id number] Arrival date & time: 09/26/17  1300     History   Chief Complaint Chief Complaint  Patient presents with  . Tachycardia    HPI Kelly Walker is a 72 y.o. female.  HPI Patient felt her heart racing yesterday.  She took her pulse and blood pressure at home pulse was noted to be 142 blood pressure was 165/115 she saw Dr.wolters in the office was  started on metoprolol 25 mg twice daily as needed for racing heartbeat.  She felt better 30 minutes after taking metoprolol yesterday she took her first dose yesterday.  This morning heart rate was noted to be 152.  She had no other associated symptoms no shortness of breath no chest pain no lightheadedness.  She had no treatment this morning.  Did not take metoprolol.  Presently is asymptomatic as I examined her. Past Medical History:  Diagnosis Date  . Diverticulosis   . Gout   . Hyperlipidemia   . Menopause   . SBO (small bowel obstruction) ([place]) 11/06/2012  . Small bowel obstruction [facility])     Patient Active Problem List   Diagnosis Date Noted  . Pain in finger of left hand 09/20/2016  . Trigger finger, left ring finger 09/20/2016  . DJD (degenerative joint disease), multiple sites 03/20/2014  . History of bone density study 04/19/2013  . Estrogen deficiency 02/18/2013  . Elevated uric acid in blood 02/18/2013  . Personal history of colonic polyps 02/18/2013  . SBO (small bowel obstruction) ([place]) 11/06/2012  . DJD (degenerative joint disease) 08/06/2011  . Obesity 08/06/2011  . Family history of colon cancer requiring screening colonoscopy 08/06/2011  . Elevated blood pressure 07/24/2011  . Hyperlipidemia   . Menopause   . Diverticulosis     Past Surgical History:  Procedure Laterality Date  . ABDOMINAL HYSTERECTOMY  2001  . APPENDECTOMY  2001  . CATARACT EXTRACTION Bilateral   . TONSILLECTOMY  1957  . UMBILICAL HERNIA REPAIR  2001      OB History    Gravida  2   Para  2   Term      Preterm      AB      Living        SAB      TAB      Ectopic      Multiple      Live Births               Home Medications    Prior to Admission medications   Medication Sig Start Date End Date Taking? Authorizing Provider  acetaminophen (TYLENOL) 325 MG tablet Take 650 mg by mouth every 6 (six) hours as needed.   Yes [provider]  ibuprofen (ADVIL,MOTRIN) 200 MG tablet Take 200 mg by mouth every 6 (six) hours as needed.   Yes [provider]  metoprolol tartrate (LOPRESSOR) 25 MG tablet Take 25 mg by mouth 2 (two) times daily with a meal. 09/25/17  Yes [provider]    Family History Family History  Problem Relation Age of Onset  . Heart failure Mother   . Diabetes Mother   . Heart attack Father   . Colon cancer Father 42  . Colon cancer Paternal Uncle 80  . Stomach cancer Cousin     Social History Social History   Tobacco Use  . Smoking status: Former Smoker    Packs/day: 1.00  Years: 4.00    Pack years: 4.00    Last attempt to quit: 06/10/1972    Years since quitting: 45.3  . Smokeless tobacco: Never Used  Substance Use Topics  . Alcohol use: Yes    Comment: occasional-social  . Drug use: No     Allergies   Lecithin; Other; Potassium nitrate; Sulfa antibiotics; Sulfasalazine; Ciprofloxacin; and Flagyl [metronidazole]   Review of Systems Review of Systems  Constitutional: Negative.   HENT: Negative.   Respiratory: Negative.   Cardiovascular: Positive for palpitations.  Gastrointestinal: Negative.   Genitourinary: Positive for vaginal discharge.       Vaginal discharge for 2 weeks  Musculoskeletal: Positive for arthralgias.       Chronic arthralgias, diffuse  Skin: Negative.   Neurological: Negative.   Psychiatric/Behavioral: Negative.   All other systems reviewed and are negative.    Physical Exam Updated Vital Signs BP (!) 123/91   Pulse  85   Temp 99.3 F (37.4 C) (Oral)   Resp (!) 21   Ht 5\' 5"  (1.651 m)   Wt 90.7 kg (200 lb)   SpO2 95%   BMI 33.28 kg/m   Physical Exam  Constitutional: She appears well-developed and well-nourished.  HENT:  Head: Normocephalic and atraumatic.  Eyes: Pupils are equal, round, and reactive to light. Conjunctivae are normal.  Neck: Neck supple. No tracheal deviation present. No thyromegaly present.  Cardiovascular: Normal rate and regular rhythm.  No murmur heard. Pulmonary/Chest: Effort normal and breath sounds normal.  Abdominal: Soft. Bowel sounds are normal. She exhibits no distension. There is no tenderness.  Genitourinary:  Genitourinary Comments: Pelvic exam declined  Musculoskeletal: Normal range of motion. She exhibits no edema or tenderness.  Neurological: She is alert. Coordination normal.  Skin: Skin is warm and dry. No rash noted.  Psychiatric: She has a normal mood and affect.  Nursing note and vitals reviewed.    ED Treatments / Results  Labs (all labs ordered are listed, but only abnormal results are displayed) Labs Reviewed  BASIC METABOLIC PANEL - Abnormal; Notable for the following components:      Result Value   Glucose, Bld 148 (*)    All other components within normal limits  CBC - Abnormal; Notable for the following components:   RBC 5.28 (*)    Hemoglobin 15.6 (*)    HCT 46.8 (*)    All other components within normal limits  I-STAT TROPONIN, ED    EKG EKG Interpretation  Date/Time:  Friday September 26 2017 14:10:56 EDT Ventricular Rate:  141 PR Interval:    QRS Duration: 92 QT Interval:  286 QTC Calculation: 438 R Axis:   30 Text Interpretation:  Supraventricular tachycardia Otherwise normal ECG No old tracing to compare Confirmed by Malvin Johns [phone]) on 09/26/2017 2:26:07 PM   Date: 09/26/2017  425 pm  Rate: 70  Rhythm: normal sinus rhythm  QRS Axis: normal  Intervals: normal  ST/T Wave abnormalities: normal  Conduction  Disutrbances: none  Narrative Interpretation: unremarkable    Radiology Dg Chest 2 View  Result Date: 09/26/2017 CLINICAL DATA:  Shortness of breath and tachycardia. EXAM: CHEST - 2 VIEW COMPARISON:  None. FINDINGS: The heart is at the upper limits of normal in size. Normal pulmonary vascularity. No focal consolidation, pleural effusion, or pneumothorax. No acute osseous abnormality. IMPRESSION: No active cardiopulmonary disease. Electronically Signed   By: Titus Dubin M.D.   On: 09/26/2017 15:25   Chest xray viewed by me Procedures Procedures (including critical  care time)  Medications Ordered in ED Medications - No data to display   Initial Impression / Assessment and Plan / ED Course  I have reviewed the triage vital signs and the nursing notes.  Pertinent labs & imaging results that were available during my care of the patient were reviewed by me and considered in my medical decision making (see chart for details).     Results for orders placed or performed during the hospital encounter of 38/88/75  Basic metabolic panel  Result Value Ref Range   Sodium 139 135 - 145 mmol/L   Potassium 4.1 3.5 - 5.1 mmol/L   Chloride 106 101 - 111 mmol/L   CO2 22 22 - 32 mmol/L   Glucose, Bld 148 (H) 65 - 99 mg/dL   BUN 11 6 - 20 mg/dL   Creatinine, Ser 0.81 0.44 - 1.00 mg/dL   Calcium 9.9 8.9 - 10.3 mg/dL   GFR calc non Af Amer >60 >60 mL/min   GFR calc Af Amer >60 >60 mL/min   Anion gap 11 5 - 15  CBC  Result Value Ref Range   WBC 9.0 4.0 - 10.5 K/uL   RBC 5.28 (H) 3.87 - 5.11 MIL/uL   Hemoglobin 15.6 (H) 12.0 - 15.0 g/dL   HCT 46.8 (H) 36.0 - 46.0 %   MCV 88.6 78.0 - 100.0 fL   MCH 29.5 26.0 - 34.0 pg   MCHC 33.3 30.0 - 36.0 g/dL   RDW 13.3 11.5 - 15.5 %   Platelets 262 150 - 400 K/uL  I-stat troponin, ED  Result Value Ref Range   Troponin i, poc 0.00 0.00 - 0.08 ng/mL   Comment 3           Dg Chest 2 View  Result Date: 09/26/2017 CLINICAL DATA:  Shortness of breath  and tachycardia. EXAM: CHEST - 2 VIEW COMPARISON:  None. FINDINGS: The heart is at the upper limits of normal in size. Normal pulmonary vascularity. No focal consolidation, pleural effusion, or pneumothorax. No acute osseous abnormality. IMPRESSION: No active cardiopulmonary disease. Electronically Signed   By: Titus Dubin M.D.   On: 09/26/2017 15:25   4:30 PM patient remains asymptomatic, stable.  Normal sinus rhythm 70 bpm. Can take metoprolol 25 mg twice daily,  Rather than on a as needed basis and follow-up with Dr.Wolters 09/29/17 and return precautions given for lightheadedness continue racing heartbeat or at patient's discretion Final Clinical Impressions(s) / ED Diagnoses  .Dx Supraventricular tachycardia Final diagnoses:  None    ED Discharge Orders    None       Orlie Dakin, MD 09/26/17 [phone]

## 2017-10-08 ENCOUNTER — Emergency Department (HOSPITAL_COMMUNITY): Payer: Medicare Other

## 2017-10-08 ENCOUNTER — Telehealth: Payer: Self-pay

## 2017-10-08 ENCOUNTER — Other Ambulatory Visit: Payer: Self-pay

## 2017-10-08 ENCOUNTER — Emergency Department (HOSPITAL_COMMUNITY)
Admission: EM | Admit: 2017-10-08 | Discharge: 2017-10-08 | Disposition: A | Discharge status: Home / Self Care | Payer: Medicare Other | Attending: Emergency Medicine | Admitting: Emergency Medicine

## 2017-10-08 DIAGNOSIS — R197 Diarrhea, unspecified: Secondary | ICD-10-CM | POA: Insufficient documentation

## 2017-10-08 DIAGNOSIS — Z87891 Personal history of nicotine dependence: Secondary | ICD-10-CM | POA: Diagnosis not present

## 2017-10-08 DIAGNOSIS — M549 Dorsalgia, unspecified: Secondary | ICD-10-CM | POA: Insufficient documentation

## 2017-10-08 DIAGNOSIS — Z79899 Other long term (current) drug therapy: Secondary | ICD-10-CM | POA: Insufficient documentation

## 2017-10-08 DIAGNOSIS — I4891 Unspecified atrial fibrillation: Secondary | ICD-10-CM | POA: Insufficient documentation

## 2017-10-08 DIAGNOSIS — I480 Paroxysmal atrial fibrillation: Secondary | ICD-10-CM | POA: Diagnosis not present

## 2017-10-08 DIAGNOSIS — R000 Tachycardia, unspecified: Secondary | ICD-10-CM | POA: Diagnosis present

## 2017-10-08 LAB — BASIC METABOLIC PANEL
Anion gap: 10 (ref 5–15)
BUN: 11 mg/dL (ref 6–20)
CALCIUM: 9.4 mg/dL (ref 8.9–10.3)
CO2: 24 mmol/L (ref 22–32)
Chloride: 104 mmol/L (ref 101–111)
Creatinine, Ser: 0.75 mg/dL (ref 0.44–1.00)
GFR calc Af Amer: >60 mL/min (ref >60)
Glucose, Bld: 110 mg/dL — ABNORMAL HIGH (ref 65–99)
POTASSIUM: 4.4 mmol/L (ref 3.5–5.1)
SODIUM: 138 mmol/L (ref 135–145)

## 2017-10-08 LAB — CBC
HCT: 47.8 % — ABNORMAL HIGH (ref 36.0–46.0)
Hemoglobin: 15.8 g/dL — ABNORMAL HIGH (ref 12.0–15.0)
MCH: 29 pg (ref 26.0–34.0)
MCHC: 33.1 g/dL (ref 30.0–36.0)
MCV: 87.7 fL (ref 78.0–100.0)
PLATELETS: 252 10*3/uL (ref 150–400)
RBC: 5.45 MIL/uL — ABNORMAL HIGH (ref 3.87–5.11)
RDW: 12.8 % (ref 11.5–15.5)
WBC: 9.6 10*3/uL (ref 4.0–10.5)

## 2017-10-08 MED ORDER — APIXABAN 5 MG PO TABS: 5.0000 mg | ORAL_TABLET | Freq: Two times a day (BID) | ORAL | 0 refills | Status: DC

## 2017-10-08 MED ORDER — METOPROLOL TARTRATE 25 MG PO TABS: ORAL_TABLET | ORAL | 0 refills | Status: DC

## 2017-10-08 NOTE — ED Triage Notes (Signed)
Patient states that she was seen here Friday for same; c/o "heart racing". HR is currently 96-127 bpm. Has been taking Metoprolol since Friday night.

## 2017-10-08 NOTE — ED Provider Notes (Signed)
[facility] EMERGENCY DEPARTMENT Provider Note   CSN: [id number] Arrival date & time: 10/08/17  1839     History   Chief Complaint Chief Complaint  Patient presents with  . Tachycardia    HPI Kelly Walker is a 72 y.o. female.  HPI   Has had episodes of heart racing in the past, feels like it is brought on by food sensitivities and would resolve, then Friday develop developed more significant symptoms of palpitations with increased blood pressures and came to the ED.  Diagnosed with SVT, not started on blood thinners. Started metoprolol from ED then followed up Dr. Stephanie Acre 4/22. Was going to be set up with Cardiologist but hadn't heard yet, taking 25mg  BID metoprolol.  NO dizziness.  2 nights ago not sure if had taken night dose, may have missed it.  Back pain started this AM, twinges, sharp pain with moving certain ways, to the left of shoulder blade.  No falls.   Today around 530PM again developed heart racing, and blood pressure increased.  Tried to take heart rate and BP at home but was not getting measurement. No chest pain, no dyspnea, no dizziness.  No numbness or weakness.    No hx of DVT/PE, No recent surgeries or long trips car/airplane, not on estrogen Had weak tea  Past Medical History:  Diagnosis Date  . Diverticulosis   . Gout   . Hyperlipidemia   . Menopause   . SBO (small bowel obstruction) ([place]) 11/06/2012  . Small bowel obstruction [facility])     Patient Active Problem List   Diagnosis Date Noted  . Pain in finger of left hand 09/20/2016  . Trigger finger, left ring finger 09/20/2016  . DJD (degenerative joint disease), multiple sites 03/20/2014  . History of bone density study 04/19/2013  . Estrogen deficiency 02/18/2013  . Elevated uric acid in blood 02/18/2013  . Personal history of colonic polyps 02/18/2013  . SBO (small bowel obstruction) ([place]) 11/06/2012  . DJD (degenerative joint disease) 08/06/2011  . Obesity 08/06/2011  . Family  history of colon cancer requiring screening colonoscopy 08/06/2011  . Elevated blood pressure 07/24/2011  . Hyperlipidemia   . Menopause   . Diverticulosis     Past Surgical History:  Procedure Laterality Date  . ABDOMINAL HYSTERECTOMY  2001  . APPENDECTOMY  2001  . CATARACT EXTRACTION Bilateral   . TONSILLECTOMY  1957  . UMBILICAL HERNIA REPAIR  2001     OB History    Gravida  2   Para  2   Term      Preterm      AB      Living        SAB      TAB      Ectopic      Multiple      Live Births               Home Medications    Prior to Admission medications   Medication Sig Start Date End Date Taking? Authorizing Provider  metoprolol tartrate (LOPRESSOR) 25 MG tablet Take 25 mg by mouth 2 (two) times daily with a meal. 09/25/17  Yes [provider]    Family History Family History  Problem Relation Age of Onset  . Heart failure Mother   . Diabetes Mother   . Heart attack Father   . Colon cancer Father 52  . Colon cancer Paternal Uncle 44  . Stomach cancer Cousin  Social History Social History   Tobacco Use  . Smoking status: Former Smoker    Packs/day: 1.00    Years: 4.00    Pack years: 4.00    Last attempt to quit: 06/10/1972    Years since quitting: 45.3  . Smokeless tobacco: Never Used  Substance Use Topics  . Alcohol use: Yes    Comment: occasional-social  . Drug use: No     Allergies   Lecithin; Other; Potassium nitrate; Sulfa antibiotics; Sulfasalazine; Ciprofloxacin; and Flagyl [metronidazole]   Review of Systems Review of Systems  Constitutional: Negative for fever.  HENT: Negative for sore throat.   Eyes: Negative for visual disturbance.  Respiratory: Negative for cough and shortness of breath.   Cardiovascular: Positive for palpitations. Negative for chest pain.  Gastrointestinal: Positive for diarrhea (was once per day, thinks from metoprolol). Negative for abdominal pain, nausea and vomiting.    Genitourinary: Negative for difficulty urinating.  Musculoskeletal: Negative for back pain and neck pain.  Skin: Negative for rash.  Neurological: Negative for syncope and headaches.     Physical Exam Updated Vital Signs BP (!) 155/104 (BP Location: Left Arm)   Pulse 95   Temp 97.9 F (36.6 C) (Oral)   Resp (!) 23   Ht 5\' 5"  (1.651 m)   Wt 90.7 kg (200 lb)   SpO2 98%   BMI 33.28 kg/m   Physical Exam  Constitutional: She is oriented to person, place, and time. She appears well-developed and well-nourished. No distress.  HENT:  Head: Normocephalic and atraumatic.  Eyes: Conjunctivae and EOM are normal.  Neck: Normal range of motion.  Cardiovascular: Normal heart sounds and intact distal pulses. An irregularly irregular rhythm present. Tachycardia present. Exam reveals no gallop and no friction rub.  No murmur heard. Pulmonary/Chest: Effort normal and breath sounds normal. No respiratory distress. She has no wheezes. She has no rales.  Abdominal: Soft. She exhibits no distension. There is no tenderness. There is no guarding.  Musculoskeletal: She exhibits no edema or tenderness.  Neurological: She is alert and oriented to person, place, and time.  Skin: Skin is warm and dry. No rash noted. She is not diaphoretic. No erythema.  Nursing note and vitals reviewed.    ED Treatments / Results  Labs (all labs ordered are listed, but only abnormal results are displayed) Labs Reviewed  BASIC METABOLIC PANEL - Abnormal; Notable for the following components:      Result Value   Glucose, Bld 110 (*)    All other components within normal limits  CBC - Abnormal; Notable for the following components:   RBC 5.45 (*)    Hemoglobin 15.8 (*)    HCT 47.8 (*)    All other components within normal limits    EKG EKG Interpretation  Date/Time:  Wednesday Oct 08 2017 20:20:41 EDT Ventricular Rate:  93 PR Interval:    QRS Duration: 84 QT Interval:  344 QTC Calculation: 428 R  Axis:   28 Text Interpretation:  Atrial fibrillation Ventricular premature complex Since prior ECG< patient is now in atrial fibrillation Confirmed by Gareth Morgan [phone]) on 10/08/2017 8:48:09 PM   Radiology No results found.  Procedures Procedures (including critical care time)  Medications Ordered in ED Medications - No data to display   Initial Impression / Assessment and Plan / ED Course  I have reviewed the triage vital signs and the nursing notes.  Pertinent labs & imaging results that were available during my care of the patient were  reviewed by me and considered in my medical decision making (see chart for details).     72 year old female with a history of hyperlipidemia presents with concern for palpitations.  Patient had been seen previously on April 19 for the same, and at that time was diagnosed with SVT.  On arrival to the emergency department today, she is in atrial fibrillation.  Initial rate on arrival was 120s, however she improved down to the 90s without intervention.  She had taken her dose of metoprolol earlier than usual tonight prior to arrival, suspect that this likely contributed to her improvement.  Reports back pain that is present with movement.  She denies shortness of breath, has no hypoxia, no PE risk factors, and have low suspicion that pain represents PE.  Suspect most likely musculoskeletal etiology of pain given pain present with movement.  X-ray of the chest and thoracic spine were done which showed degenerative changes.  Patient hemodynamically stable in the emergency department.  Her chadsvasc score is 2.  Will initiate Eliquis, and refer patient to the intrafibrillation clinic for further evaluation.  Will increase her home metoprolol from 25 mg twice daily to 37.5 in the PM and 25mg  in AM and recommend close follow up as outpatient.   Final Clinical Impressions(s) / ED Diagnoses   Final diagnoses:  Atrial fibrillation with RVR [facility])    ED  Discharge Orders    None       Gareth Morgan, MD 10/09/17 [phone]

## 2017-10-08 NOTE — Telephone Encounter (Signed)
SENT REFERRAL TO SCHEDULING 

## 2017-10-13 ENCOUNTER — Ambulatory Visit (HOSPITAL_COMMUNITY)
Admission: RE | Admit: 2017-10-13 | Discharge: 2017-10-13 | Disposition: A | Discharge status: Home / Self Care | Payer: Medicare Other | Source: Ambulatory Visit | Attending: Nurse Practitioner | Admitting: Nurse Practitioner

## 2017-10-13 ENCOUNTER — Encounter (HOSPITAL_COMMUNITY): Payer: Self-pay | Admitting: Nurse Practitioner

## 2017-10-13 VITALS — BP 142/84 | HR 76 | Ht 65.0 in | Wt 205.0 lb

## 2017-10-13 DIAGNOSIS — Z833 Family history of diabetes mellitus: Secondary | ICD-10-CM | POA: Diagnosis not present

## 2017-10-13 DIAGNOSIS — I480 Paroxysmal atrial fibrillation: Secondary | ICD-10-CM

## 2017-10-13 DIAGNOSIS — Z888 Allergy status to other drugs, medicaments and biological substances status: Secondary | ICD-10-CM | POA: Insufficient documentation

## 2017-10-13 DIAGNOSIS — Z800 Family history of malignant neoplasm of digestive organs: Secondary | ICD-10-CM | POA: Insufficient documentation

## 2017-10-13 DIAGNOSIS — M109 Gout, unspecified: Secondary | ICD-10-CM | POA: Diagnosis not present

## 2017-10-13 DIAGNOSIS — Z9841 Cataract extraction status, right eye: Secondary | ICD-10-CM | POA: Diagnosis not present

## 2017-10-13 DIAGNOSIS — Z79899 Other long term (current) drug therapy: Secondary | ICD-10-CM | POA: Insufficient documentation

## 2017-10-13 DIAGNOSIS — Z7901 Long term (current) use of anticoagulants: Secondary | ICD-10-CM | POA: Diagnosis not present

## 2017-10-13 DIAGNOSIS — Z90710 Acquired absence of both cervix and uterus: Secondary | ICD-10-CM | POA: Diagnosis not present

## 2017-10-13 DIAGNOSIS — Z8249 Family history of ischemic heart disease and other diseases of the circulatory system: Secondary | ICD-10-CM | POA: Diagnosis not present

## 2017-10-13 DIAGNOSIS — I4891 Unspecified atrial fibrillation: Secondary | ICD-10-CM | POA: Diagnosis not present

## 2017-10-13 DIAGNOSIS — Z87891 Personal history of nicotine dependence: Secondary | ICD-10-CM | POA: Diagnosis not present

## 2017-10-13 DIAGNOSIS — Z9842 Cataract extraction status, left eye: Secondary | ICD-10-CM | POA: Diagnosis not present

## 2017-10-13 DIAGNOSIS — Z881 Allergy status to other antibiotic agents status: Secondary | ICD-10-CM | POA: Diagnosis not present

## 2017-10-13 DIAGNOSIS — E785 Hyperlipidemia, unspecified: Secondary | ICD-10-CM | POA: Diagnosis not present

## 2017-10-13 DIAGNOSIS — Z882 Allergy status to sulfonamides status: Secondary | ICD-10-CM | POA: Insufficient documentation

## 2017-10-13 DIAGNOSIS — Z883 Allergy status to other anti-infective agents status: Secondary | ICD-10-CM | POA: Diagnosis not present

## 2017-10-14 ENCOUNTER — Ambulatory Visit (HOSPITAL_COMMUNITY)
Admission: RE | Admit: 2017-10-14 | Discharge: 2017-10-14 | Disposition: A | Discharge status: Home / Self Care | Payer: Medicare Other | Source: Ambulatory Visit | Attending: Nurse Practitioner | Admitting: Nurse Practitioner

## 2017-10-14 DIAGNOSIS — Z6834 Body mass index (BMI) 34.0-34.9, adult: Secondary | ICD-10-CM | POA: Insufficient documentation

## 2017-10-14 DIAGNOSIS — E785 Hyperlipidemia, unspecified: Secondary | ICD-10-CM | POA: Diagnosis not present

## 2017-10-14 DIAGNOSIS — I080 Rheumatic disorders of both mitral and aortic valves: Secondary | ICD-10-CM | POA: Diagnosis not present

## 2017-10-14 DIAGNOSIS — I480 Paroxysmal atrial fibrillation: Secondary | ICD-10-CM | POA: Diagnosis not present

## 2017-10-14 DIAGNOSIS — E669 Obesity, unspecified: Secondary | ICD-10-CM | POA: Diagnosis not present

## 2017-10-14 NOTE — Progress Notes (Signed)
  Echocardiogram 2D Echocardiogram has been performed.  Kelly Walker 10/14/2017, 11:05 AM

## 2017-10-14 NOTE — Progress Notes (Signed)
Primary Care Physician: Kelly Jordan, MD Referring Physician: [facility] ER f/u   Kelly Walker is a 72 y.o. female without a h/o significant PMH, that is in the afib clinic for f/u of 2 recent ER visit. The first one was for SVT, 4/19, afib was not documented. She had seen her PCP the day before and described heart racing and was started on metoprolol 25 mg bid. She returned to there ER , 5/1, and afib was documented. She was started on apixaban 5 mg bid. In the afib clinic for f/u, she is in [place]. Metoprolol dose was increased in the ER as well.  We discussed triggers, she does not drink significant alcohol or caffeine. No tobacco. She tries to walk for exercise.She does not think she has any snoring or apnea.  She has not noted any further afib.  Today, she denies symptoms of palpitations, chest pain, shortness of breath, orthopnea, PND, lower extremity edema, dizziness, presyncope, syncope, or neurologic sequela. The patient is tolerating medications without difficulties and is otherwise without complaint today.   Past Medical History:  Diagnosis Date  . Diverticulosis   . Gout   . Hyperlipidemia   . Menopause   . SBO (small bowel obstruction) ([place]) 11/06/2012  . Small bowel obstruction [facility])    Past Surgical History:  Procedure Laterality Date  . ABDOMINAL HYSTERECTOMY  2001  . APPENDECTOMY  2001  . CATARACT EXTRACTION Bilateral   . TONSILLECTOMY  1957  . UMBILICAL HERNIA REPAIR  2001    Current Outpatient Medications  Medication Sig Dispense Refill  . apixaban (ELIQUIS) 5 MG TABS tablet Take 1 tablet (5 mg total) by mouth 2 (two) times daily. 60 tablet 0  . metoprolol tartrate (LOPRESSOR) 25 MG tablet Take 1.5 tablets at night and 1 tablet in the morning.  0   No current facility-administered medications for this encounter.     Allergies  Allergen Reactions  . Lecithin Other (See Comments)    Intestinal distress  . Other     Fluoroquinolones Antibiotics=increased  heartbeat,fatigue,lack of focus   . Potassium Nitrate Other (See Comments)    & Potassium Nitrite=Increased heart rate,disorientation   . Sulfa Antibiotics     Joint pain  . Sulfasalazine Other (See Comments)    Joint pain  . Ciprofloxacin Anxiety  . Flagyl [Metronidazole] Anxiety    Social History   Socioeconomic History  . Marital status: Married    Spouse name: Not on file  . Number of children: Not on file  . Years of education: Not on file  . Highest education level: Not on file  Occupational History  . Not on file  Social Needs  . Financial resource strain: Not on file  . Food insecurity:    Worry: Not on file    Inability: Not on file  . Transportation needs:    Medical: Not on file    Non-medical: Not on file  Tobacco Use  . Smoking status: Former Smoker    Packs/day: 1.00    Years: 4.00    Pack years: 4.00    Last attempt to quit: 06/10/1972    Years since quitting: 45.3  . Smokeless tobacco: Never Used  Substance and Sexual Activity  . Alcohol use: Yes    Comment: occasional-social  . Drug use: No  . Sexual activity: Yes  Lifestyle  . Physical activity:    Days per week: Not on file    Minutes per session: Not on file  . Stress:  Not on file  Relationships  . Social connections:    Talks on phone: Not on file    Gets together: Not on file    Attends religious service: Not on file    Active member of club or organization: Not on file    Attends meetings of clubs or organizations: Not on file    Relationship status: Not on file  . Intimate partner violence:    Fear of current or ex partner: Not on file    Emotionally abused: Not on file    Physically abused: Not on file    Forced sexual activity: Not on file  Other Topics Concern  . Not on file  Social History Narrative  . Not on file    Family History  Problem Relation Age of Onset  . Heart failure Mother   . Diabetes Mother   . Heart attack Father   . Colon cancer Father 37  . Colon  cancer Paternal Uncle 42  . Stomach cancer Cousin     ROS- All systems are reviewed and negative except as per the HPI above  Physical Exam: Vitals:   10/13/17 1506  BP: (!) 142/84  Pulse: 76  Weight: 205 lb (93 kg)  Height: 5\' 5"  (7.672 m)   Wt Readings from Last 3 Encounters:  10/13/17 205 lb (93 kg)  10/08/17 200 lb (90.7 kg)  09/26/17 200 lb (90.7 kg)    Labs: Lab Results  Component Value Date   NA 138 10/08/2017   K 4.4 10/08/2017   CL 104 10/08/2017   CO2 24 10/08/2017   GLUCOSE 110 (H) 10/08/2017   BUN 11 10/08/2017   CREATININE 0.75 10/08/2017   CALCIUM 9.4 10/08/2017   No results found for: INR Lab Results  Component Value Date   CHOL 193 03/14/2014   HDL 68 03/14/2014   LDLCALC 105 (H) 03/14/2014   TRIG 102 03/14/2014     GEN- The patient is well appearing, alert and oriented x 3 today.   Head- normocephalic, atraumatic Eyes-  Sclera clear, conjunctiva pink Ears- hearing intact Oropharynx- clear Neck- supple, no JVP Lymph- no cervical lymphadenopathy Lungs- Clear to ausculation bilaterally, normal work of breathing Heart- Regular rate and rhythm, no murmurs, rubs or gallops, PMI not laterally displaced GI- soft, NT, ND, + BS Extremities- no clubbing, cyanosis, or edema MS- no significant deformity or atrophy Skin- no rash or lesion Psych- euthymic mood, full affect Neuro- strength and sensation are intact  EKG-NSR at 76 bpm,pr int 152 ms, qrs int 76 ms, qtc 405 ms Epic records reviewed    Assessment and Plan: 1. New onset afib General info re afib, triggers and how to manage it Continue metoprolol tartrate at 75 mg am and 50 mg pm Echo  2. Chadsvasc score of 2 Continue eliquis 5 mg bid Bleeding precautions discussed   Will refer to general cardiology in 6 months as pt will be leaving soon to spend the summer/ early fall in [place]   Kelly Walker, [facility] [address] [place], [place]  [id number] [phone]

## 2017-10-23 ENCOUNTER — Ambulatory Visit: Payer: Medicare Other | Admitting: Cardiovascular Disease

## 2017-10-23 ENCOUNTER — Encounter: Payer: Self-pay | Admitting: Cardiovascular Disease

## 2017-10-23 DIAGNOSIS — I480 Paroxysmal atrial fibrillation: Secondary | ICD-10-CM

## 2017-10-23 NOTE — Assessment & Plan Note (Signed)
History of PAF with recent referral by Roderic Palau nurse practitioner from the [place] clinic to be established in my clinic.  She had 2 episodes of tachypalpitations that brought her to the emergency room last 1 of which was on 5 /1 where A. fib was documented, the previous one on 419 was remarkable for SVT.  Was begun on metoprolol and Eliquis.  She stopped drinking alcohol.  Does not drink any more caffeine.  A 2D echo was performed which was normal.  Her only other cardiac risk factors are family history.  We will keep her on her current medications for the time being.

## 2017-10-23 NOTE — Progress Notes (Signed)
10/23/2017 Kelly Walker   1946-03-03  [id number]  Primary Physician Jonathon Jordan, MD Primary Cardiologist: Lorretta Harp MD Lupe Carney, [place]  HPI:  Kelly Walker is a 72 y.o. moderately overweight married Caucasian female mother of 2, grandmother and 2 grandchildren who was referred by Roderic Palau nurse practitioner to be established to my practice for ongoing care of PAF.  She is a retired Licensed conveyancer.  Her only risk factors are family history of the father that had a myocardial infarction at age 50.  She is never had a heart attack or stroke denies chest pain or shortness of breath.  She had 2 episodes of tachypalpitations which brought her to the emergency room, the first one was on 4/19 which was remarkable for SVT in the second 1 on 5/1 where A. fib was demonstrated.  She was begun on metoprolol and Eliquis.  Subsequent 2D echo was entirely normal.  She cannot identify any particular triggers.   Current Meds  Medication Sig  . apixaban (ELIQUIS) 5 MG TABS tablet Take 1 tablet (5 mg total) by mouth 2 (two) times daily.  . metoprolol tartrate (LOPRESSOR) 25 MG tablet Take 1.5 tablets at night and 1 tablet in the morning.     Allergies  Allergen Reactions  . Lecithin Other (See Comments)    Intestinal distress  . Other     Fluoroquinolones Antibiotics=increased heartbeat,fatigue,lack of focus   . Potassium Nitrate Other (See Comments)    & Potassium Nitrite=Increased heart rate,disorientation   . Sulfa Antibiotics     Joint pain  . Sulfasalazine Other (See Comments)    Joint pain  . Ciprofloxacin Anxiety  . Flagyl [Metronidazole] Anxiety    Social History   Socioeconomic History  . Marital status: Married    Spouse name: Not on file  . Number of children: Not on file  . Years of education: Not on file  . Highest education level: Not on file  Occupational History  . Not on file  Social Needs  . Financial resource strain: Not on file  . Food insecurity:      Worry: Not on file    Inability: Not on file  . Transportation needs:    Medical: Not on file    Non-medical: Not on file  Tobacco Use  . Smoking status: Former Smoker    Packs/day: 1.00    Years: 4.00    Pack years: 4.00    Last attempt to quit: 06/10/1972    Years since quitting: 45.4  . Smokeless tobacco: Never Used  Substance and Sexual Activity  . Alcohol use: Yes    Comment: occasional-social  . Drug use: No  . Sexual activity: Yes  Lifestyle  . Physical activity:    Days per week: Not on file    Minutes per session: Not on file  . Stress: Not on file  Relationships  . Social connections:    Talks on phone: Not on file    Gets together: Not on file    Attends religious service: Not on file    Active member of club or organization: Not on file    Attends meetings of clubs or organizations: Not on file    Relationship status: Not on file  . Intimate partner violence:    Fear of current or ex partner: Not on file    Emotionally abused: Not on file    Physically abused: Not on file    Forced sexual activity: Not on  file  Other Topics Concern  . Not on file  Social History Narrative  . Not on file     Review of Systems: General: negative for chills, fever, night sweats or weight changes.  Cardiovascular: negative for chest pain, dyspnea on exertion, edema, orthopnea, palpitations, paroxysmal nocturnal dyspnea or shortness of breath Dermatological: negative for rash Respiratory: negative for cough or wheezing Urologic: negative for hematuria Abdominal: negative for nausea, vomiting, diarrhea, bright red blood per rectum, melena, or hematemesis Neurologic: negative for visual changes, syncope, or dizziness All other systems reviewed and are otherwise negative except as noted above.    Blood pressure 137/80, pulse 71, height 5\' 5"  (1.651 m), weight 205 lb 9.6 oz (93.3 kg), SpO2 94 %.  General appearance: alert and no distress Neck: no adenopathy, no carotid  bruit, no JVD, supple, symmetrical, trachea midline and thyroid not enlarged, symmetric, no tenderness/mass/nodules Lungs: clear to auscultation bilaterally Heart: regular rate and rhythm, S1, S2 normal, no murmur, click, rub or gallop Extremities: extremities normal, atraumatic, no cyanosis or edema Pulses: 2+ and symmetric Skin: Skin color, texture, turgor normal. No rashes or lesions Neurologic: Alert and oriented X 3, normal strength and tone. Normal symmetric reflexes. Normal coordination and gait  EKG not performed today  ASSESSMENT AND PLAN:   Paroxysmal atrial fibrillation ([place]) History of PAF with recent referral by Roderic Palau nurse practitioner from the [facility] to be established in my clinic.  She had 2 episodes of tachypalpitations that brought her to the emergency room last 1 of which was on 5 /1 where A. fib was documented, the previous one on 419 was remarkable for SVT.  Was begun on metoprolol and Eliquis.  She stopped drinking alcohol.  Does not drink any more caffeine.  A 2D echo was performed which was normal.  Her only other cardiac risk factors are family history.  We will keep her on her current medications for the time being.      Lorretta Harp MD FACP,FACC,FAHA, [facility] 10/23/2017 2:02 PM

## 2017-10-23 NOTE — Patient Instructions (Signed)
Your physician wants you to follow-up in: ONE YEAR WITH DR BERRY You will receive a reminder letter in the mail two months in advance. If you don't receive a letter, please call our office to schedule the follow-up appointment.  

## 2017-10-28 ENCOUNTER — Encounter: Payer: Self-pay | Admitting: Physician Assistant

## 2017-10-28 ENCOUNTER — Ambulatory Visit: Payer: Medicare Other | Admitting: Physician Assistant

## 2017-10-28 ENCOUNTER — Telehealth: Payer: Self-pay | Admitting: Emergency Medicine

## 2017-10-28 VITALS — BP 130/80 | HR 65 | Ht 65.0 in | Wt 204.0 lb

## 2017-10-28 DIAGNOSIS — Z800 Family history of malignant neoplasm of digestive organs: Secondary | ICD-10-CM

## 2017-10-28 DIAGNOSIS — Z86010 Personal history of colonic polyps: Secondary | ICD-10-CM

## 2017-10-28 DIAGNOSIS — Z7901 Long term (current) use of anticoagulants: Secondary | ICD-10-CM | POA: Diagnosis not present

## 2017-10-28 DIAGNOSIS — Z01818 Encounter for other preprocedural examination: Secondary | ICD-10-CM | POA: Diagnosis not present

## 2017-10-28 DIAGNOSIS — I480 Paroxysmal atrial fibrillation: Secondary | ICD-10-CM

## 2017-10-28 MED ORDER — NA SULFATE-K SULFATE-MG SULF 17.5-3.13-1.6 GM/177ML PO SOLN: 1.0000 | ORAL | 0 refills | Status: DC

## 2017-10-28 NOTE — Telephone Encounter (Signed)
Patient with diagnosis of atrial fibrillation on Eliquis for anticoagulation.    Procedure: colonoscopy Date of procedure: 11/07/17  CHADS2-VASc score of  2 (AGE,AD, AGE, female)  CrCl 101.9 Platelet count 252  Per office protocol, patient can hold Eliquis for 1 days prior to procedure.    Patient should restart Eliquis on the evening of procedure or day after, at discretion of procedure MD

## 2017-10-28 NOTE — Progress Notes (Signed)
Chief Complaint: Family history of colon cancer, personal history of colon polyps, chronic anticoagulation  HPI:    Kelly Walker is a 72 year old female from [place], who previously followed with Dr. Deatra Ina, with recent history of being started on Eliquis for A. fib (echo 10/14/2017 LVEF 60-65%) and returns to clinic today for discussion of a surveillance colonoscopy due to her history of colon polyps and family history of colon cancer on chronic anticoagulation with Eliquis recently started in May.  Patient had previously been scheduled for colonoscopy with Dr. Silverio Decamp but canceled this appointment last year.    08/28/2011 colonoscopy Dr. Deatra Ina for family history of colon cancer in her father and fraternal uncle.  3 mm sessile polyp in the descending colon, 3 mm sessile polyp in the sigmoid colon, moderate diverticulosis in the sigmoid colon otherwise normal.  Pathology positive for serrated adenoma as well as hyperplastic polyp.  Repeat recommended in 5 years.    10/08/2017 ED notes show patient diagnosed with A. fib and started on Eliquis as well as a beta-blocker.  She did see a cardiologist Dr. Gwenlyn Found 10/23/17 who continued her Eliquis.    Today, patient describes that she has developed some food sensitivities over the past 5 years and has been trying to narrow this down but they do cause her to have some loose stools as well as palpitations.  Other than this no GI complaints today.    Denies fever, chills, blood in her stool, change in bowel habits, weight loss or symptoms that awaken her at night.  Past Medical History:  Diagnosis Date  . Diverticulosis   . Gout   . Hyperlipidemia   . Menopause   . SBO (small bowel obstruction) ([place]) 11/06/2012  . Small bowel obstruction [facility])     Past Surgical History:  Procedure Laterality Date  . ABDOMINAL HYSTERECTOMY  2001  . APPENDECTOMY  2001  . CATARACT EXTRACTION Bilateral   . TONSILLECTOMY  1957  . UMBILICAL HERNIA REPAIR  2001    Current  Outpatient Medications  Medication Sig Dispense Refill  . diphenhydrAMINE HCl (ANTI-HIST PO) Take by mouth.    . Probiotic Product (PROBIOTIC DAILY PO) Take by mouth.    Marland Kitchen apixaban (ELIQUIS) 5 MG TABS tablet Take 1 tablet (5 mg total) by mouth 2 (two) times daily. 60 tablet 0  . metoprolol tartrate (LOPRESSOR) 25 MG tablet Take 1.5 tablets at night and 1 tablet in the morning.  0   No current facility-administered medications for this visit.     Allergies as of 10/28/2017 - Review Complete 10/28/2017  Allergen Reaction Noted  . Lecithin Other (See Comments) 10/07/2016  . Other  10/07/2016  . Potassium nitrate Other (See Comments) 10/07/2016  . Sulfa antibiotics  08/28/2011  . Sulfasalazine Other (See Comments) 08/28/2011  . Ciprofloxacin Anxiety 07/24/2011  . Flagyl [metronidazole] Anxiety 07/24/2011    Family History  Problem Relation Age of Onset  . Heart failure Mother   . Diabetes Mother   . Heart attack Father   . Colon cancer Father 102  . Colon cancer Paternal Uncle 83  . Stomach cancer Cousin     Social History   Socioeconomic History  . Marital status: Married    Spouse name: Not on file  . Number of children: Not on file  . Years of education: Not on file  . Highest education level: Not on file  Occupational History  . Not on file  Social Needs  . Financial resource strain: Not  on file  . Food insecurity:    Worry: Not on file    Inability: Not on file  . Transportation needs:    Medical: Not on file    Non-medical: Not on file  Tobacco Use  . Smoking status: Former Smoker    Packs/day: 1.00    Years: 4.00    Pack years: 4.00    Last attempt to quit: 06/10/1972    Years since quitting: 45.4  . Smokeless tobacco: Never Used  Substance and Sexual Activity  . Alcohol use: Yes    Comment: occasional-social  . Drug use: No  . Sexual activity: Yes  Lifestyle  . Physical activity:    Days per week: Not on file    Minutes per session: Not on file  .  Stress: Not on file  Relationships  . Social connections:    Talks on phone: Not on file    Gets together: Not on file    Attends religious service: Not on file    Active member of club or organization: Not on file    Attends meetings of clubs or organizations: Not on file    Relationship status: Not on file  . Intimate partner violence:    Fear of current or ex partner: Not on file    Emotionally abused: Not on file    Physically abused: Not on file    Forced sexual activity: Not on file  Other Topics Concern  . Not on file  Social History Narrative  . Not on file    Review of Systems:    Constitutional: No weight loss, fever or chills Skin: No rash Cardiovascular: No chest pain  Respiratory: No SOB Gastrointestinal: See HPI and otherwise negative Genitourinary: No dysuria Neurological: No headache Musculoskeletal: No new muscle or joint pain Hematologic: No bleeding  Psychiatric: No history of depression or anxiety   Physical Exam:  Vital signs: BP 130/80   Pulse 65   Ht 5\' 5"  (1.651 m)   Wt 204 lb (92.5 kg)   SpO2 95%   BMI 33.95 kg/m   Constitutional:   Pleasant overweight Caucasian female appears to be in NAD, Well developed, Well nourished, alert and cooperative Head:  Normocephalic and atraumatic. Eyes:   PEERL, EOMI. No icterus. Conjunctiva pink. Ears:  Normal auditory acuity. Neck:  Supple Throat: Oral cavity and pharynx without inflammation, swelling or lesion.  Respiratory: Respirations even and unlabored. Lungs clear to auscultation bilaterally.   No wheezes, crackles, or rhonchi.  Cardiovascular: Normal S1, S2. No MRG. Regular rate and rhythm. No peripheral edema, cyanosis or pallor.  Gastrointestinal:  Soft, nondistended, nontender. No rebound or guarding. Normal bowel sounds. No appreciable masses or hepatomegaly. Rectal:  Not performed.  Msk:  Symmetrical without gross deformities.  Neurologic:  Alert and  oriented x4;  grossly normal  neurologically.  Skin:   Dry and intact without significant lesions or rashes. Psychiatric: Demonstrates good judgement and reason without abnormal affect or behaviors.  MOST RECENT LABS AND IMAGING: CBC    Component Value Date/Time   WBC 9.6 10/08/2017 1938   RBC 5.45 (H) 10/08/2017 1938   HGB 15.8 (H) 10/08/2017 1938   HCT 47.8 (H) 10/08/2017 1938   PLT 252 10/08/2017 1938   MCV 87.7 10/08/2017 1938   MCH 29.0 10/08/2017 1938   MCHC 33.1 10/08/2017 1938   RDW 12.8 10/08/2017 1938   LYMPHSABS 1.7 03/14/2014 0905   MONOABS 0.4 03/14/2014 0905   EOSABS 0.2 03/14/2014 0905  BASOSABS 0.0 03/14/2014 0905    CMP     Component Value Date/Time   NA 138 10/08/2017 1938   K 4.4 10/08/2017 1938   CL 104 10/08/2017 1938   CO2 24 10/08/2017 1938   GLUCOSE 110 (H) 10/08/2017 1938   BUN 11 10/08/2017 1938   CREATININE 0.75 10/08/2017 1938   CREATININE 0.79 03/14/2014 0905   CALCIUM 9.4 10/08/2017 1938   PROT 7.0 03/14/2014 0905   ALBUMIN 4.0 03/14/2014 0905   AST 21 03/14/2014 0905   ALT 32 03/14/2014 0905   ALKPHOS 88 03/14/2014 0905   BILITOT 0.6 03/14/2014 0905   GFRNONAA >60 10/08/2017 1938   GFRNONAA 78 03/14/2014 0905   GFRAA >60 10/08/2017 1938   GFRAA >89 03/14/2014 0905    Assessment: 1.  Family history of colon cancer: In her father and fraternal uncle 2.  Personal history of adenomatous polyps: Last colonoscopy 08/28/2011 Dr. Deatra Ina 3.  Chronic anticoagulation: Eliquis as below 4.  New diagnosis of A. fib: In May 2019, started on Eliquis  Plan: 1.  Patient was scheduled for a surveillance colonoscopy in the [place] with Dr. Silverio Decamp.  Did discuss risk, benefits, limitations and alternatives and patient agrees to proceed. 2.  Advised the patient to hold her Eliquis for 2 days prior to time of her procedure.  We will communicate with the patient's cardiologist Dr. Gwenlyn Found to ensure that holding her Eliquis for 2 days is appropriate for her. 3.  Patient to follow in clinic  per recommendations from Dr. Silverio Decamp after time of procedure.  Ellouise Newer, PA-C [facility] 10/28/2017, 11:12 AM  Cc: Jonathon Jordan, MD

## 2017-10-28 NOTE — Telephone Encounter (Signed)
   Primary Cardiologist: Quay Burow, MD  Chart reviewed as part of pre-operative protocol coverage. The patient was just seen by Dr. Gwenlyn Found on 10/23/17 and was doing well. Given past medical history and time since last visit, based on ACC/AHA guidelines, Kelly Walker would be at acceptable risk for the planned low risk procedure without further cardiovascular testing.   Awaiting pharmacy recommendations for holding anticoagulation.   Once pharmacy weighs in this recommendation and sent to the requesting party via [place] fax function and remove from pre-op pool.  Please call with questions.  Daune Perch, NP 10/28/2017, 3:35 PM

## 2017-10-28 NOTE — Telephone Encounter (Signed)
   Primary Cardiologist:Jonathan Gwenlyn Found, MD  Chart reviewed as part of pre-operative protocol coverage. Pre-op clearance already addressed by colleagues in earlier phone notes. To summarize recommendations:  Chart reviewed as part of pre-operative protocol coverage. The patient was just seen by Dr. Gwenlyn Found on 10/23/17 and was doing well. Given past medical history and time since last visit, based on ACC/AHA guidelines, Kelly Walker would be at acceptable risk for the planned low risk procedure without further cardiovascular testing.   Patient with diagnosis of atrial fibrillation on Eliquis for anticoagulation.    Procedure: colonoscopy Date of procedure: 11/07/17  CHADS2-VASc score of  2 (AGE,AD, AGE, female) CrCl 101.9 Platelet count 252  Per office protocol, patient can hold Eliquis for 1 days prior to procedure.    Patient should restart Eliquis on the evening of procedure or day after, at discretion of procedure MD  Will route this bundled recommendation to requesting provider via [facility] fax function. Please call with questions.  Daune Perch, NP 10/28/2017, 5:07 PM

## 2017-10-28 NOTE — Telephone Encounter (Signed)
I will send to pharmacy for anticoagulant recs.

## 2017-10-28 NOTE — Patient Instructions (Addendum)
You have been scheduled for a colonoscopy. Please follow written instructions given to you at your visit today.  Please pick up your prep supplies at the pharmacy within the next 1-3 days. If you use inhalers (even only as needed), please bring them with you on the day of your procedure. Your physician has requested that you go to www.startemmi.com and enter the access code given to you at your visit today. This web site gives a general overview about your procedure. However, you should still follow specific instructions given to you by our office regarding your preparation for the procedure.  You will be contacted by our office prior to your procedure for directions on holding your Eliquis.  If you do not hear from our office 1 week prior to your scheduled procedure, please call [phone] to discuss.

## 2017-10-28 NOTE — Telephone Encounter (Signed)
[facility] Pre-operative Risk Assessment     Request for surgical clearance:     Endoscopy Procedure  What type of surgery is being performed?  colonoscopy  When is this surgery scheduled?     11-07-17  What type of clearance is required ?   Pharmacy  Are there any medications that need to be held prior to surgery and how long? Eliquis  Practice name and name of physician performing surgery?      [facility]  What is your office phone and fax number?      Phone- [phone]  [phone]  Anesthesia type (None, local, MAC, general) ?       MAC

## 2017-10-30 NOTE — Telephone Encounter (Signed)
Spoke to patient and informed her to hold her Eliquis after Wednesday 5/29 and to resume it per discharge instructions. Patient verbalized understanding.

## 2017-11-04 NOTE — Progress Notes (Signed)
Reviewed and agree with documentation and assessment and plan. K. Veena Teressa Mcglocklin , MD   

## 2017-11-05 ENCOUNTER — Telehealth: Payer: Self-pay | Admitting: Gastroenterology

## 2017-11-05 NOTE — Telephone Encounter (Signed)
Wants to confirm she is safe to take Suprep with her sulfasalazine and sulfa allergy. She asks I confirm this with her provider.

## 2017-11-06 ENCOUNTER — Encounter (HOSPITAL_COMMUNITY): Payer: Self-pay | Admitting: Emergency Medicine

## 2017-11-06 ENCOUNTER — Emergency Department (HOSPITAL_COMMUNITY): Payer: Medicare Other

## 2017-11-06 ENCOUNTER — Observation Stay (HOSPITAL_COMMUNITY)
Admission: EM | Admit: 2017-11-06 | Discharge: 2017-11-07 | Disposition: A | Discharge status: Home / Self Care | Payer: Medicare Other | Attending: Family Medicine | Admitting: Family Medicine

## 2017-11-06 ENCOUNTER — Telehealth: Payer: Self-pay | Admitting: Gastroenterology

## 2017-11-06 ENCOUNTER — Other Ambulatory Visit: Payer: Self-pay

## 2017-11-06 DIAGNOSIS — Z79899 Other long term (current) drug therapy: Secondary | ICD-10-CM | POA: Insufficient documentation

## 2017-11-06 DIAGNOSIS — E785 Hyperlipidemia, unspecified: Secondary | ICD-10-CM | POA: Diagnosis present

## 2017-11-06 DIAGNOSIS — Z85038 Personal history of other malignant neoplasm of large intestine: Secondary | ICD-10-CM | POA: Diagnosis not present

## 2017-11-06 DIAGNOSIS — Z7901 Long term (current) use of anticoagulants: Secondary | ICD-10-CM | POA: Insufficient documentation

## 2017-11-06 DIAGNOSIS — E669 Obesity, unspecified: Secondary | ICD-10-CM | POA: Diagnosis not present

## 2017-11-06 DIAGNOSIS — I4891 Unspecified atrial fibrillation: Secondary | ICD-10-CM | POA: Diagnosis present

## 2017-11-06 DIAGNOSIS — Z6832 Body mass index (BMI) 32.0-32.9, adult: Secondary | ICD-10-CM | POA: Insufficient documentation

## 2017-11-06 DIAGNOSIS — Z87891 Personal history of nicotine dependence: Secondary | ICD-10-CM | POA: Insufficient documentation

## 2017-11-06 DIAGNOSIS — Z86010 Personal history of colonic polyps: Secondary | ICD-10-CM | POA: Diagnosis not present

## 2017-11-06 DIAGNOSIS — E7800 Pure hypercholesterolemia, unspecified: Secondary | ICD-10-CM | POA: Diagnosis not present

## 2017-11-06 DIAGNOSIS — I480 Paroxysmal atrial fibrillation: Secondary | ICD-10-CM | POA: Diagnosis not present

## 2017-11-06 HISTORY — DX: Unspecified atrial fibrillation: I48.91

## 2017-11-06 HISTORY — DX: Heart disease, unspecified: I51.9

## 2017-11-06 HISTORY — DX: Other ill-defined heart diseases: I51.89

## 2017-11-06 LAB — CBC
HEMATOCRIT: 46.2 % — AB (ref 36.0–46.0)
Hemoglobin: 14.9 g/dL (ref 12.0–15.0)
MCH: 27.9 pg (ref 26.0–34.0)
MCHC: 32.3 g/dL (ref 30.0–36.0)
MCV: 86.5 fL (ref 78.0–100.0)
Platelets: 230 10*3/uL (ref 150–400)
RBC: 5.34 MIL/uL — AB (ref 3.87–5.11)
RDW: 12.5 % (ref 11.5–15.5)
WBC: 8 10*3/uL (ref 4.0–10.5)

## 2017-11-06 LAB — BASIC METABOLIC PANEL
Anion gap: 9 (ref 5–15)
BUN: 11 mg/dL (ref 6–20)
CHLORIDE: 106 mmol/L (ref 101–111)
CO2: 24 mmol/L (ref 22–32)
Calcium: 9.5 mg/dL (ref 8.9–10.3)
Creatinine, Ser: 0.78 mg/dL (ref 0.44–1.00)
GFR calc Af Amer: >60 mL/min (ref >60)
GFR calc non Af Amer: >60 mL/min (ref >60)
Glucose, Bld: 110 mg/dL — ABNORMAL HIGH (ref 65–99)
POTASSIUM: 4.2 mmol/L (ref 3.5–5.1)
SODIUM: 139 mmol/L (ref 135–145)

## 2017-11-06 LAB — MRSA PCR SCREENING: MRSA by PCR: NEGATIVE

## 2017-11-06 LAB — TROPONIN I
Troponin I: <0.03 ng/mL (ref <0.03)
Troponin I: <0.03 ng/mL (ref <0.03)

## 2017-11-06 LAB — TSH: TSH: 1.502 u[IU]/mL (ref 0.350–4.500)

## 2017-11-06 LAB — HEMOGLOBIN A1C
Hgb A1c MFr Bld: 5.9 % — ABNORMAL HIGH (ref 4.8–5.6)
MEAN PLASMA GLUCOSE: 122.63 mg/dL

## 2017-11-06 LAB — I-STAT TROPONIN, ED: Troponin i, poc: 0 ng/mL (ref 0.00–0.08)

## 2017-11-06 LAB — BRAIN NATRIURETIC PEPTIDE: B Natriuretic Peptide: 234 pg/mL — ABNORMAL HIGH (ref 0.0–100.0)

## 2017-11-06 MED ORDER — DILTIAZEM LOAD VIA INFUSION
10.0000 mg | Freq: Once | INTRAVENOUS | Status: AC
  Administered 2017-11-06: 10 mg via INTRAVENOUS
  Filled 2017-11-06: qty 10

## 2017-11-06 MED ORDER — SODIUM CHLORIDE 0.9 % IV SOLN
250.0000 mL | INTRAVENOUS | Status: DC
  Administered 2017-11-07: 250 mL via INTRAVENOUS

## 2017-11-06 MED ORDER — SODIUM CHLORIDE 0.9% FLUSH: 3.0000 mL | INTRAVENOUS | Status: DC | PRN

## 2017-11-06 MED ORDER — ACETAMINOPHEN 325 MG PO TABS: 650.0000 mg | ORAL_TABLET | Freq: Four times a day (QID) | ORAL | Status: DC | PRN

## 2017-11-06 MED ORDER — APIXABAN 5 MG PO TABS
5.0000 mg | ORAL_TABLET | Freq: Two times a day (BID) | ORAL | Status: DC
  Administered 2017-11-06 – 2017-11-07 (×3): 5 mg via ORAL
  Filled 2017-11-06 (×3): qty 1

## 2017-11-06 MED ORDER — ZOLPIDEM TARTRATE 5 MG PO TABS: 5.0000 mg | ORAL_TABLET | Freq: Every evening | ORAL | Status: DC | PRN

## 2017-11-06 MED ORDER — SODIUM CHLORIDE 0.9 % IV SOLN
INTRAVENOUS | Status: DC
  Administered 2017-11-06: 09:00:00 via INTRAVENOUS

## 2017-11-06 MED ORDER — METOPROLOL TARTRATE 12.5 MG HALF TABLET
12.5000 mg | ORAL_TABLET | Freq: Two times a day (BID) | ORAL | Status: DC
  Administered 2017-11-06 – 2017-11-07 (×2): 12.5 mg via ORAL
  Filled 2017-11-06 (×2): qty 1

## 2017-11-06 MED ORDER — ONDANSETRON HCL 4 MG/2ML IJ SOLN: 4.0000 mg | Freq: Four times a day (QID) | INTRAMUSCULAR | Status: DC | PRN

## 2017-11-06 MED ORDER — SODIUM CHLORIDE 0.9% FLUSH
3.0000 mL | Freq: Two times a day (BID) | INTRAVENOUS | Status: DC
  Administered 2017-11-06 – 2017-11-07 (×2): 3 mL via INTRAVENOUS

## 2017-11-06 MED ORDER — ONDANSETRON HCL 4 MG/2ML IJ SOLN: 4.0000 mg | Freq: Three times a day (TID) | INTRAMUSCULAR | Status: DC | PRN

## 2017-11-06 MED ORDER — DILTIAZEM HCL ER COATED BEADS 120 MG PO CP24
120.0000 mg | ORAL_CAPSULE | Freq: Every day | ORAL | Status: DC
  Administered 2017-11-06 – 2017-11-07 (×2): 120 mg via ORAL
  Filled 2017-11-06 (×2): qty 1

## 2017-11-06 MED ORDER — DILTIAZEM HCL-DEXTROSE 100-5 MG/100ML-% IV SOLN (PREMIX)
5.0000 mg/h | INTRAVENOUS | Status: DC
  Administered 2017-11-06: 5 mg/h via INTRAVENOUS
  Administered 2017-11-06: 15 mg/h via INTRAVENOUS
  Filled 2017-11-06 (×2): qty 100

## 2017-11-06 MED ORDER — ACETAMINOPHEN 325 MG PO TABS: 650.0000 mg | ORAL_TABLET | ORAL | Status: DC | PRN

## 2017-11-06 NOTE — H&P (View-Only) (Signed)
Cardiology Consultation:   Patient ID: Kelly Walker; [id number]; 1946-01-18   Admit date: 11/06/2017 Date of Consult: 11/06/2017  Primary Care Provider: Jonathon Jordan, MD Primary Cardiologist: Dr. Quay Burow, MD  Patient Profile:   Kelly Walker is a 72 y.o. female with a hx of recently diagnosed paroxysmal atrial fibrillation on Eliquis and HLD who is being seen today for the evaluation of atrial fibrillation with RVR at the request of Dr. Lorin Mercy.  History of Present Illness:   Kelly Walker is a 72 year old female with a history stated above who reports around midday on 11/05/2017 she developed what she thought was a "racing heart".  She did not notice any irregularity therefore did not think much of it until her symptoms persisted. She took an extra 1/2 dose of her metoprolol, but her symptoms again did not improve.  She does report feeling mildly diaphoretic however with no chest pain, dizziness or syncope. Given her symptoms, she reported to [facility] for further evaluation on 11/06/17. She has a family history of MI in her father but no personal hx of CAD. She reports that since her Afib dx, she has completely eliminated caffeine and alcohol from her diet. She has many food allergies and is wondering if this could be the cause of the atrial fibrillation. She reports complete medication compliance with Eliquis without missing any doses.   In the ED she was found to be in atrial fibrillation with RVR with a rate approximately 120 bpm. She was placed on a Dilitazem gtt at 30m/hr.  BNP was noted be mildly elevated at 234. A Chest x-ray was performed which revealed no evidence of acute cardiopulmonary disease.  A TSH was drawn which was WNL at 1.502.  Troponin level was negative at <0.03.  She was first seen in the office by Dr. BGwenlyn Foundon 10/23/2017 after referral from DRoderic Palau NP with the AF clinic.  She was initially diagnosed with atrial fibrillation after 2 episodes of tachy palpitations,  both of which  brought her to the emergency department. The first episode occurred on 09/26/2017 which revealed SVT and the second on 10/08/2017 which showed atrial fibrillation with RVR. She was started on metoprolol 25 mg and Eliquis 5 mg. An echocardiogram was performed 10/14/2017 which showed a normal LVEF, no regional wall motion abnormalities and grade 1 diastolic dysfunction. Otherwise there has been no clear etiology for her atrial fibrillation.   She was admitted per internal medicine service and cardiology was consulted given her prior history to atrial fibrillation and current Eliquis therapy.  Past Medical History:  Diagnosis Date  . Atrial fibrillation ([facility]    on Eliquis  . Diverticulosis   . Gout   . Grade I diastolic dysfunction   . Hyperlipidemia   . Menopause   . SBO (small bowel obstruction) ([facility] 11/06/2012    Past Surgical History:  Procedure Laterality Date  . ABDOMINAL HYSTERECTOMY  2001  . APPENDECTOMY  2001  . CATARACT EXTRACTION Bilateral   . TONSILLECTOMY  1957  . UMBILICAL HERNIA REPAIR  2001     Prior to Admission medications   Medication Sig Start Date End Date Taking? Authorizing Provider  apixaban (ELIQUIS) 5 MG TABS tablet Take 1 tablet (5 mg total) by mouth 2 (two) times daily. 10/08/17  Yes SGareth Morgan MD  metoprolol tartrate (LOPRESSOR) 25 MG tablet Take 1.5 tablets at night and 1 tablet in the morning. Patient taking differently: Take 25-37.5 mg by mouth See admin instructions. Take 1 tablet  every morning and take Take 1.5 tablets at night 10/08/17  Yes Schlossman, Junie Panning, MD  Probiotic Product (PROBIOTIC DAILY PO) Take 1 capsule by mouth daily.    Yes [provider]  Na Sulfate-K Sulfate-Mg Sulf (SUPREP BOWEL PREP KIT) 17.5-3.13-1.6 GM/177ML SOLN Take 1 kit by mouth as directed. Patient not taking: Reported on 11/06/2017 10/28/17   Levin Erp, PA    Inpatient Medications: Scheduled Meds: . apixaban  5 mg Oral BID   Continuous  Infusions: . sodium chloride 10 mL/hr at 11/06/17 0845  . diltiazem (CARDIZEM) infusion 5 mg/hr (11/06/17 0223)   PRN Meds: acetaminophen, ondansetron (ZOFRAN) IV, zolpidem  Allergies:    Allergies  Allergen Reactions  . Lecithin Other (See Comments)    Intestinal distress  . Other     Fluoroquinolones Antibiotics=increased heartbeat,fatigue,lack of focus   . Potassium Nitrate Other (See Comments)    & Potassium Nitrite=Increased heart rate,disorientation   . Sulfa Antibiotics     Joint pain  . Sulfasalazine Other (See Comments)    Joint pain  . Ciprofloxacin Anxiety  . Flagyl [Metronidazole] Anxiety    Social History:   Social History   Socioeconomic History  . Marital status: Married    Spouse name: Not on file  . Number of children: Not on file  . Years of education: Not on file  . Highest education level: Not on file  Occupational History  . Not on file  Social Needs  . Financial resource strain: Not on file  . Food insecurity:    Worry: Not on file    Inability: Not on file  . Transportation needs:    Medical: Not on file    Non-medical: Not on file  Tobacco Use  . Smoking status: Former Smoker    Packs/day: 1.00    Years: 4.00    Pack years: 4.00    Last attempt to quit: 06/10/1972    Years since quitting: 45.4  . Smokeless tobacco: Never Used  Substance and Sexual Activity  . Alcohol use: Yes    Comment: occasional-social  . Drug use: No  . Sexual activity: Yes  Lifestyle  . Physical activity:    Days per week: Not on file    Minutes per session: Not on file  . Stress: Not on file  Relationships  . Social connections:    Talks on phone: Not on file    Gets together: Not on file    Attends religious service: Not on file    Active member of club or organization: Not on file    Attends meetings of clubs or organizations: Not on file    Relationship status: Not on file  . Intimate partner violence:    Fear of current or ex partner: Not on file     Emotionally abused: Not on file    Physically abused: Not on file    Forced sexual activity: Not on file  Other Topics Concern  . Not on file  Social History Narrative  . Not on file    Family History:   Family History  Problem Relation Age of Onset  . Heart failure Mother   . Diabetes Mother   . Heart attack Father   . Colon cancer Father 22  . Colon cancer Paternal Uncle 77  . Stomach cancer Cousin    Family Status:  Family Status  Relation Name Status  . Mother  Deceased  . Father  Deceased  . Annamarie Major  (Not Specified)  .  Cousin  (Not Specified)    ROS:  Please see the history of present illness.  All other ROS reviewed and negative.     Physical Exam/Data:   Vitals:   11/06/17 1000 11/06/17 1100 11/06/17 1200 11/06/17 1222  BP: 108/60 94/69 101/66 101/66  Pulse:    76  Resp: 16 10 15 14   Temp:    98.3 F (36.8 C)  TempSrc:    Oral  SpO2:    91%  Weight:      Height:        Intake/Output Summary (Last 24 hours) at 11/06/2017 1350 Last data filed at 11/06/2017 1222 Gross per 24 hour  Intake 86.08 ml  Output -  Net 86.08 ml   Filed Weights   11/06/17 0608  Weight: 200 lb 6.4 oz (90.9 kg)   Body mass index is 33.35 kg/m.   General: Well developed, well nourished, NAD Skin: Warm, dry, intact  Head: Normocephalic, atraumatic, clear, moist mucus membranes. Neck: Negative for carotid bruits. No JVD Lungs:Clear to ausculation bilaterally. No wheezes, rales, or rhonchi. Breathing is unlabored. Cardiovascular: Irregularly irregular with S1 S2. No murmurs, rubs or gallops Abdomen: Soft, non-tender, non-distended with normoactive bowel sounds. No obvious abdominal masses. MSK: Strength and tone appear normal for age. 5/5 in all extremities Extremities: No edema. No clubbing or cyanosis. DP/PT pulses 2+ bilaterally Neuro: Alert and oriented. No focal deficits. No facial asymmetry. MAE spontaneously. Psych: Responds to questions appropriately with normal  affect.     EKG:  The EKG was personally reviewed and demonstrates: 11/06/2017 atrial fibrillation HR 119 Telemetry:  Telemetry was personally reviewed and demonstrates: 11/06/2017 atrial fibrillation HR 72  Relevant CV Studies:  ECHO: Echocardiogram 10/14/2017  Study Conclusions  - Left ventricle: The cavity size was normal. There was mild   concentric hypertrophy. Systolic function was normal. The   estimated ejection fraction was in the range of 60% to 65%. Wall   motion was normal; there were no regional wall motion   abnormalities. Doppler parameters are consistent with abnormal   left ventricular relaxation (grade 1 diastolic dysfunction).   Doppler parameters are consistent with indeterminate ventricular   filling pressure. - Aortic valve: Transvalvular velocity was within the normal range.   There was no stenosis. There was mild regurgitation. - Mitral valve: Transvalvular velocity was within the normal range.   There was no evidence for stenosis. There was mild regurgitation. - Right ventricle: The cavity size was normal. Wall thickness was   normal. Systolic function was normal. - Atrial septum: No defect or patent foramen ovale was identified. - Tricuspid valve: There was trivial regurgitation. - Pulmonary arteries: Systolic pressure was within the normal   range. - Global longitudinal strain -19.4% (normal).  CATH: None  Laboratory Data:  Chemistry Recent Labs  Lab 11/06/17 0102  NA 139  K 4.2  CL 106  CO2 24  GLUCOSE 110*  BUN 11  CREATININE 0.78  CALCIUM 9.5  GFRNONAA >60  GFRAA >60  ANIONGAP 9    Total Protein  Date Value Ref Range Status  03/14/2014 7.0 6.0 - 8.3 g/dL Final   Albumin  Date Value Ref Range Status  03/14/2014 4.0 3.5 - 5.2 g/dL Final   AST  Date Value Ref Range Status  03/14/2014 21 0 - 37 U/L Final   ALT  Date Value Ref Range Status  03/14/2014 32 0 - 35 U/L Final   Alkaline Phosphatase  Date Value Ref Range Status  03/14/2014 88 39 - 117 U/L Final   Total Bilirubin  Date Value Ref Range Status  03/14/2014 0.6 0.2 - 1.2 mg/dL Final   Hematology Recent Labs  Lab 11/06/17 0102  WBC 8.0  RBC 5.34*  HGB 14.9  HCT 46.2*  MCV 86.5  MCH 27.9  MCHC 32.3  RDW 12.5  PLT 230   Cardiac Enzymes Recent Labs  Lab 11/06/17 0418 11/06/17 1018  TROPONINI <0.03 <0.03    Recent Labs  Lab 11/06/17 0109  TROPIPOC 0.00    BNP Recent Labs  Lab 11/06/17 0419  BNP 234.0*    DDimer No results for input(s): DDIMER in the last 168 hours. TSH:  Lab Results  Component Value Date   TSH 1.502 11/06/2017   Lipids: Lab Results  Component Value Date   CHOL 193 03/14/2014   HDL 68 03/14/2014   LDLCALC 105 (H) 03/14/2014   TRIG 102 03/14/2014   CHOLHDL 2.8 03/14/2014   HgbA1c: Lab Results  Component Value Date   HGBA1C 5.9 (H) 11/06/2017    Radiology/Studies:  Dg Chest 2 View  Result Date: 11/06/2017 CLINICAL DATA:  Atrial fibrillation EXAM: CHEST - 2 VIEW COMPARISON:  10/08/2017 FINDINGS: Lungs are clear. No pleural effusion or pneumothorax. The heart is normal in size. Degenerative changes of the visualized thoracolumbar spine. IMPRESSION: No evidence of acute cardiopulmonary disease. Electronically Signed   By: Julian Hy M.D.   On: 11/06/2017 01:19   Assessment and Plan:   1.  Paroxysmal atrial fibrillation, now with RVR: -Patient initially seen on 09/26/2017 and 10/08/2017 at [facility] with complaints of palpitations found to be in SVT then atrial fibrillation with RVR.  She was initially discharged from the ED with instructions to take metoprolol 25 mg twice daily rather than on an as-needed basis (instructed by PCP).  She was referred to the atrial fibrillation clinic for follow-up. During this appointment on 10/13/2017 her metoprolol was uptitrated and she was then referred for general cardiology given that she would be spending the summer/early fall in [place].  She was seen by Dr. Gwenlyn Found on  10/23/2017 and seemed to be doing well on metoprolol (91m in AM and 37.559min the PM) and Eliquis 14m23m -Currently rate controlled on Diltiazem gtt at 14mg29m Her heart rates are now more controlled in the 70 range -Consider transitioning her to Diltiazem PO 120mg94m monitor. Her BP has been stable but soft 101/66, 94/69, 108/60, 104/58 -She reports uninterrupted Eliquis therapy since initiation. Consider DCCV during this admission now that rate is stabilized -CHA2DS2VASc =3 (Age, Sex, CHF-diastolic dysfunction)  2.  HLD:  -Lipid panel pending -Last lipid panel on file from 03/14/2014 with LDL at 105 -Not currently on statin therapy  3.  Familial history of colon cancer/personal history of colon polyps: -Plan was for pt to have routine colonoscopy tomorrow with bowel prep today. She was to hold her Eliquis today, however she has been at the hospital overnight and received her Eliquis today. She reports taking it as scheduled yesterday    For questions or updates, please contact [facility] consult [email or web address] for contact info under Cardiology/STEMI.   Signed, [facility] Pager: [phone] 1:50 PM

## 2017-11-06 NOTE — Care Management (Signed)
This is a no charge note  Pending admission per Dr. Randal Buba  72 year old lady with past medical history of atrial fibrillation on Eliquis, small bowel obstruction, diastolic CHF on 2D echo, gout, who presents with A. fib with RVR.  Has chest discomfort.  Chest x-ray negative.  Troponin negative.  Patient is placed on stepdown bed as inpatient.  Cardizem drip was started.   Ivor Costa, MD  [facility] Pager [phone]  If 7PM-7AM, please contact night-coverage [email or web address] Password [email or web address] 11/06/2017, 4:18 AM

## 2017-11-06 NOTE — ED Provider Notes (Signed)
[facility] EMERGENCY DEPARTMENT Provider Note   CSN: [id number] Arrival date & time: 11/06/17  0039     History   Chief Complaint Chief Complaint  Patient presents with  . Atrial Fibrillation    HPI Kelly Walker is a 72 y.o. female.  The history is provided by the patient.  Atrial Fibrillation  This is a recurrent problem. The current episode started 12 to 24 hours ago. The problem occurs constantly. The problem has not changed since onset.Pertinent negatives include no chest pain, no abdominal pain, no headaches and no shortness of breath. Nothing aggravates the symptoms. Nothing relieves the symptoms. She has tried nothing for the symptoms. The treatment provided no relief.    Past Medical History:  Diagnosis Date  . Diverticulosis   . Gout   . Hyperlipidemia   . Menopause   . SBO (small bowel obstruction) ([place]) 11/06/2012  . Small bowel obstruction [facility])     Patient Active Problem List   Diagnosis Date Noted  . Atrial fibrillation with RVR ([place]) 11/06/2017  . Paroxysmal atrial fibrillation ([place]) 10/23/2017  . Pain in finger of left hand 09/20/2016  . Trigger finger, left ring finger 09/20/2016  . DJD (degenerative joint disease), multiple sites 03/20/2014  . History of bone density study 04/19/2013  . Estrogen deficiency 02/18/2013  . Elevated uric acid in blood 02/18/2013  . Personal history of colonic polyps 02/18/2013  . SBO (small bowel obstruction) ([place]) 11/06/2012  . DJD (degenerative joint disease) 08/06/2011  . Obesity 08/06/2011  . Family history of colon cancer requiring screening colonoscopy 08/06/2011  . Elevated blood pressure 07/24/2011  . Hyperlipidemia   . Menopause   . Diverticulosis     Past Surgical History:  Procedure Laterality Date  . ABDOMINAL HYSTERECTOMY  2001  . APPENDECTOMY  2001  . CATARACT EXTRACTION Bilateral   . TONSILLECTOMY  1957  . UMBILICAL HERNIA REPAIR  2001     OB History    Gravida  2   Para   2   Term      Preterm      AB      Living        SAB      TAB      Ectopic      Multiple      Live Births               Home Medications    Prior to Admission medications   Medication Sig Start Date End Date Taking? Authorizing Provider  apixaban (ELIQUIS) 5 MG TABS tablet Take 1 tablet (5 mg total) by mouth 2 (two) times daily. 10/08/17  Yes Gareth Morgan, MD  metoprolol tartrate (LOPRESSOR) 25 MG tablet Take 1.5 tablets at night and 1 tablet in the morning. Patient taking differently: Take 25-37.5 mg by mouth See admin instructions. Take 1 tablet every morning and take Take 1.5 tablets at night 10/08/17  Yes Schlossman, Junie Panning, MD  Probiotic Product (PROBIOTIC DAILY PO) Take 1 capsule by mouth daily.    Yes [provider]  Na Sulfate-K Sulfate-Mg Sulf (SUPREP BOWEL PREP KIT) 17.5-3.13-1.6 GM/177ML SOLN Take 1 kit by mouth as directed. Patient not taking: Reported on 11/06/2017 10/28/17   Levin Erp, PA    Family History Family History  Problem Relation Age of Onset  . Heart failure Mother   . Diabetes Mother   . Heart attack Father   . Colon cancer Father 76  . Colon cancer Paternal  Uncle 70  . Stomach cancer Cousin     Social History Social History   Tobacco Use  . Smoking status: Former Smoker    Packs/day: 1.00    Years: 4.00    Pack years: 4.00    Last attempt to quit: 06/10/1972    Years since quitting: 45.4  . Smokeless tobacco: Never Used  Substance Use Topics  . Alcohol use: Yes    Comment: occasional-social  . Drug use: No     Allergies   Lecithin; Other; Potassium nitrate; Sulfa antibiotics; Sulfasalazine; Ciprofloxacin; and Flagyl [metronidazole]   Review of Systems Review of Systems  Constitutional: Negative for diaphoresis and fever.  Respiratory: Negative for shortness of breath.   Cardiovascular: Positive for palpitations. Negative for chest pain and leg swelling.  Gastrointestinal: Negative for abdominal  pain and vomiting.  Musculoskeletal: Negative for arthralgias.  Neurological: Negative for weakness, numbness and headaches.  Psychiatric/Behavioral: Negative for agitation.  All other systems reviewed and are negative.    Physical Exam Updated Vital Signs BP 118/77   Pulse 65   Temp 98.1 F (36.7 C) (Oral)   Resp 18   SpO2 94%   Physical Exam  Constitutional: She is oriented to person, place, and time. She appears well-developed and well-nourished. No distress.  HENT:  Head: Normocephalic and atraumatic.  Mouth/Throat: No oropharyngeal exudate.  Eyes: Pupils are equal, round, and reactive to light. Conjunctivae are normal.  Neck: Normal range of motion. Neck supple.  Cardiovascular: An irregularly irregular rhythm present. Tachycardia present.  Pulmonary/Chest: Effort normal and breath sounds normal. No stridor. She has no wheezes. She has no rales.  Abdominal: Soft. Bowel sounds are normal. She exhibits no mass. There is no tenderness. There is no rebound and no guarding.  Musculoskeletal: Normal range of motion.  Neurological: She is alert and oriented to person, place, and time. She displays normal reflexes.  Skin: Skin is warm and dry. Capillary refill takes less than 2 seconds.  Psychiatric: She has a normal mood and affect.     ED Treatments / Results  Labs (all labs ordered are listed, but only abnormal results are displayed) Results for orders placed or performed during the hospital encounter of 38/75/64  Basic metabolic panel  Result Value Ref Range   Sodium 139 135 - 145 mmol/L   Potassium 4.2 3.5 - 5.1 mmol/L   Chloride 106 101 - 111 mmol/L   CO2 24 22 - 32 mmol/L   Glucose, Bld 110 (H) 65 - 99 mg/dL   BUN 11 6 - 20 mg/dL   Creatinine, Ser 0.78 0.44 - 1.00 mg/dL   Calcium 9.5 8.9 - 10.3 mg/dL   GFR calc non Af Amer >60 >60 mL/min   GFR calc Af Amer >60 >60 mL/min   Anion gap 9 5 - 15  CBC  Result Value Ref Range   WBC 8.0 4.0 - 10.5 K/uL   RBC 5.34  (H) 3.87 - 5.11 MIL/uL   Hemoglobin 14.9 12.0 - 15.0 g/dL   HCT 46.2 (H) 36.0 - 46.0 %   MCV 86.5 78.0 - 100.0 fL   MCH 27.9 26.0 - 34.0 pg   MCHC 32.3 30.0 - 36.0 g/dL   RDW 12.5 11.5 - 15.5 %   Platelets 230 150 - 400 K/uL  I-stat troponin, ED  Result Value Ref Range   Troponin i, poc 0.00 0.00 - 0.08 ng/mL   Comment 3           Dg Chest  2 View  Result Date: 11/06/2017 CLINICAL DATA:  Atrial fibrillation EXAM: CHEST - 2 VIEW COMPARISON:  10/08/2017 FINDINGS: Lungs are clear. No pleural effusion or pneumothorax. The heart is normal in size. Degenerative changes of the visualized thoracolumbar spine. IMPRESSION: No evidence of acute cardiopulmonary disease. Electronically Signed   By: Julian Hy M.D.   On: 11/06/2017 01:19   Dg Chest 2 View  Result Date: 10/08/2017 CLINICAL DATA:  72 y/o F; patient felt heart racing on and off for 2 days. Pain below the right scapula. EXAM: CHEST - 2 VIEW COMPARISON:  09/26/2017 chest radiograph. FINDINGS: Normal cardiac silhouette. Aortic atherosclerosis with calcification. Clear lungs. No pleural effusion or pneumothorax. Mild multilevel degenerative changes of the thoracic spine. IMPRESSION: No acute pulmonary process identified. Electronically Signed   By: Kristine Garbe M.D.   On: 10/08/2017 21:21   Dg Thoracic Spine 2 View  Result Date: 10/08/2017 CLINICAL DATA:  72 y/o  F; pain below the right scapula. EXAM: THORACIC SPINE 2 VIEWS COMPARISON:  None. FINDINGS: There is no evidence of thoracic spine fracture. Alignment is normal. Multilevel discogenic degenerative changes of the thoracic spine. Flowing anterior ossification across multiple segments compatible with DISH. IMPRESSION: 1. No acute fracture or dislocation. 2. Findings of DISH and mild multilevel discogenic degenerative changes. Electronically Signed   By: Kristine Garbe M.D.   On: 10/08/2017 21:22    EKG EKG Interpretation  Date/Time:  Thursday Nov 06 2017  00:44:45 EDT Ventricular Rate:  116 PR Interval:    QRS Duration: 80 QT Interval:  306 QTC Calculation: 425 R Axis:   36 Text Interpretation:  Atrial fibrillation with rapid ventricular response Nonspecific T wave abnormality Confirmed by Randal Buba, Ladale Sherburn ([id number]) on 11/06/2017 4:15:57 AM   Radiology Dg Chest 2 View  Result Date: 11/06/2017 CLINICAL DATA:  Atrial fibrillation EXAM: CHEST - 2 VIEW COMPARISON:  10/08/2017 FINDINGS: Lungs are clear. No pleural effusion or pneumothorax. The heart is normal in size. Degenerative changes of the visualized thoracolumbar spine. IMPRESSION: No evidence of acute cardiopulmonary disease. Electronically Signed   By: Julian Hy M.D.   On: 11/06/2017 01:19    Procedures Procedures (including critical care time)  Medications Ordered in ED Medications  diltiazem (CARDIZEM) 1 mg/mL load via infusion 10 mg (10 mg Intravenous Bolus from Bag 11/06/17 0223)    And  diltiazem (CARDIZEM) 100 mg in dextrose 5% 181m (1 mg/mL) infusion (5 mg/hr Intravenous New Bag/Given 11/06/17 0223)  ondansetron (ZOFRAN) injection 4 mg (has no administration in time range)  acetaminophen (TYLENOL) tablet 650 mg (has no administration in time range)  zolpidem (AMBIEN) tablet 5 mg (has no administration in time range)     MDM Reviewed: previous chart, nursing note and vitals Reviewed previous: labs Interpretation: labs, ECG and x-ray (negative troponin negative chest xray ) Total time providing critical care: 30-74 minutes (diltiazem drip). This excludes time spent performing separately reportable procedures and services. Consults: admitting MD    Final Clinical Impressions(s) / ED Diagnoses   Admit for afib that is refractory    Inaya Gillham, MD 11/06/17 00175

## 2017-11-06 NOTE — Plan of Care (Signed)
Continue current care plan 

## 2017-11-06 NOTE — Consult Note (Addendum)
Cardiology Consultation:   Patient ID: Kelly Walker; [id number]; 05-24-1946   Admit date: 11/06/2017 Date of Consult: 11/06/2017  Primary Care Provider: Jonathon Jordan, MD Primary Cardiologist: Dr. Quay Burow, MD  Patient Profile:   Kelly Walker is a 72 y.o. female with a hx of recently diagnosed paroxysmal atrial fibrillation on Eliquis and HLD who is being seen today for the evaluation of atrial fibrillation with RVR at the request of Dr. Lorin Mercy.  History of Present Illness:   Kelly Walker is a 72 year old female with a history stated above who reports around midday on 11/05/2017 she developed what she thought was a "racing heart".  She did not notice any irregularity therefore did not think much of it until her symptoms persisted. She took an extra 1/2 dose of her metoprolol, but her symptoms again did not improve.  She does report feeling mildly diaphoretic however with no chest pain, dizziness or syncope. Given her symptoms, she reported to [facility] for further evaluation on 11/06/17. She has a family history of MI in her father but no personal hx of CAD. She reports that since her Afib dx, she has completely eliminated caffeine and alcohol from her diet. She has many food allergies and is wondering if this could be the cause of the atrial fibrillation. She reports complete medication compliance with Eliquis without missing any doses.   In the ED she was found to be in atrial fibrillation with RVR with a rate approximately 120 bpm. She was placed on a Dilitazem gtt at 28m/hr.  BNP was noted be mildly elevated at 234. A Chest x-ray was performed which revealed no evidence of acute cardiopulmonary disease.  A TSH was drawn which was WNL at 1.502.  Troponin level was negative at <0.03.  She was first seen in the office by Dr. BGwenlyn Foundon 10/23/2017 after referral from DRoderic Palau NP with the AF clinic.  She was initially diagnosed with atrial fibrillation after 2 episodes of tachy palpitations,  both of which  brought her to the emergency department. The first episode occurred on 09/26/2017 which revealed SVT and the second on 10/08/2017 which showed atrial fibrillation with RVR. She was started on metoprolol 25 mg and Eliquis 5 mg. An echocardiogram was performed 10/14/2017 which showed a normal LVEF, no regional wall motion abnormalities and grade 1 diastolic dysfunction. Otherwise there has been no clear etiology for her atrial fibrillation.   She was admitted per internal medicine service and cardiology was consulted given her prior history to atrial fibrillation and current Eliquis therapy.  Past Medical History:  Diagnosis Date  . Atrial fibrillation ([facility]    on Eliquis  . Diverticulosis   . Gout   . Grade I diastolic dysfunction   . Hyperlipidemia   . Menopause   . SBO (small bowel obstruction) ([facility] 11/06/2012    Past Surgical History:  Procedure Laterality Date  . ABDOMINAL HYSTERECTOMY  2001  . APPENDECTOMY  2001  . CATARACT EXTRACTION Bilateral   . TONSILLECTOMY  1957  . UMBILICAL HERNIA REPAIR  2001     Prior to Admission medications   Medication Sig Start Date End Date Taking? Authorizing Provider  apixaban (ELIQUIS) 5 MG TABS tablet Take 1 tablet (5 mg total) by mouth 2 (two) times daily. 10/08/17  Yes SGareth Morgan MD  metoprolol tartrate (LOPRESSOR) 25 MG tablet Take 1.5 tablets at night and 1 tablet in the morning. Patient taking differently: Take 25-37.5 mg by mouth See admin instructions. Take 1 tablet  every morning and take Take 1.5 tablets at night 10/08/17  Yes Schlossman, Junie Panning, MD  Probiotic Product (PROBIOTIC DAILY PO) Take 1 capsule by mouth daily.    Yes [provider]  Na Sulfate-K Sulfate-Mg Sulf (SUPREP BOWEL PREP KIT) 17.5-3.13-1.6 GM/177ML SOLN Take 1 kit by mouth as directed. Patient not taking: Reported on 11/06/2017 10/28/17   Levin Erp, PA    Inpatient Medications: Scheduled Meds: . apixaban  5 mg Oral BID   Continuous  Infusions: . sodium chloride 10 mL/hr at 11/06/17 0845  . diltiazem (CARDIZEM) infusion 5 mg/hr (11/06/17 0223)   PRN Meds: acetaminophen, ondansetron (ZOFRAN) IV, zolpidem  Allergies:    Allergies  Allergen Reactions  . Lecithin Other (See Comments)    Intestinal distress  . Other     Fluoroquinolones Antibiotics=increased heartbeat,fatigue,lack of focus   . Potassium Nitrate Other (See Comments)    & Potassium Nitrite=Increased heart rate,disorientation   . Sulfa Antibiotics     Joint pain  . Sulfasalazine Other (See Comments)    Joint pain  . Ciprofloxacin Anxiety  . Flagyl [Metronidazole] Anxiety    Social History:   Social History   Socioeconomic History  . Marital status: Married    Spouse name: Not on file  . Number of children: Not on file  . Years of education: Not on file  . Highest education level: Not on file  Occupational History  . Not on file  Social Needs  . Financial resource strain: Not on file  . Food insecurity:    Worry: Not on file    Inability: Not on file  . Transportation needs:    Medical: Not on file    Non-medical: Not on file  Tobacco Use  . Smoking status: Former Smoker    Packs/day: 1.00    Years: 4.00    Pack years: 4.00    Last attempt to quit: 06/10/1972    Years since quitting: 45.4  . Smokeless tobacco: Never Used  Substance and Sexual Activity  . Alcohol use: Yes    Comment: occasional-social  . Drug use: No  . Sexual activity: Yes  Lifestyle  . Physical activity:    Days per week: Not on file    Minutes per session: Not on file  . Stress: Not on file  Relationships  . Social connections:    Talks on phone: Not on file    Gets together: Not on file    Attends religious service: Not on file    Active member of club or organization: Not on file    Attends meetings of clubs or organizations: Not on file    Relationship status: Not on file  . Intimate partner violence:    Fear of current or ex partner: Not on file     Emotionally abused: Not on file    Physically abused: Not on file    Forced sexual activity: Not on file  Other Topics Concern  . Not on file  Social History Narrative  . Not on file    Family History:   Family History  Problem Relation Age of Onset  . Heart failure Mother   . Diabetes Mother   . Heart attack Father   . Colon cancer Father 67  . Colon cancer Paternal Uncle 50  . Stomach cancer Cousin    Family Status:  Family Status  Relation Name Status  . Mother  Deceased  . Father  Deceased  . Annamarie Major  (Not Specified)  .  Cousin  (Not Specified)    ROS:  Please see the history of present illness.  All other ROS reviewed and negative.     Physical Exam/Data:   Vitals:   11/06/17 1000 11/06/17 1100 11/06/17 1200 11/06/17 1222  BP: 108/60 94/69 101/66 101/66  Pulse:    76  Resp: 16 10 15 14   Temp:    98.3 F (36.8 C)  TempSrc:    Oral  SpO2:    91%  Weight:      Height:        Intake/Output Summary (Last 24 hours) at 11/06/2017 1350 Last data filed at 11/06/2017 1222 Gross per 24 hour  Intake 86.08 ml  Output -  Net 86.08 ml   Filed Weights   11/06/17 0608  Weight: 200 lb 6.4 oz (90.9 kg)   Body mass index is 33.35 kg/m.   General: Well developed, well nourished, NAD Skin: Warm, dry, intact  Head: Normocephalic, atraumatic, clear, moist mucus membranes. Neck: Negative for carotid bruits. No JVD Lungs:Clear to ausculation bilaterally. No wheezes, rales, or rhonchi. Breathing is unlabored. Cardiovascular: Irregularly irregular with S1 S2. No murmurs, rubs or gallops Abdomen: Soft, non-tender, non-distended with normoactive bowel sounds. No obvious abdominal masses. MSK: Strength and tone appear normal for age. 5/5 in all extremities Extremities: No edema. No clubbing or cyanosis. DP/PT pulses 2+ bilaterally Neuro: Alert and oriented. No focal deficits. No facial asymmetry. MAE spontaneously. Psych: Responds to questions appropriately with normal  affect.     EKG:  The EKG was personally reviewed and demonstrates: 11/06/2017 atrial fibrillation HR 119 Telemetry:  Telemetry was personally reviewed and demonstrates: 11/06/2017 atrial fibrillation HR 72  Relevant CV Studies:  ECHO: Echocardiogram 10/14/2017  Study Conclusions  - Left ventricle: The cavity size was normal. There was mild   concentric hypertrophy. Systolic function was normal. The   estimated ejection fraction was in the range of 60% to 65%. Wall   motion was normal; there were no regional wall motion   abnormalities. Doppler parameters are consistent with abnormal   left ventricular relaxation (grade 1 diastolic dysfunction).   Doppler parameters are consistent with indeterminate ventricular   filling pressure. - Aortic valve: Transvalvular velocity was within the normal range.   There was no stenosis. There was mild regurgitation. - Mitral valve: Transvalvular velocity was within the normal range.   There was no evidence for stenosis. There was mild regurgitation. - Right ventricle: The cavity size was normal. Wall thickness was   normal. Systolic function was normal. - Atrial septum: No defect or patent foramen ovale was identified. - Tricuspid valve: There was trivial regurgitation. - Pulmonary arteries: Systolic pressure was within the normal   range. - Global longitudinal strain -19.4% (normal).  CATH: None  Laboratory Data:  Chemistry Recent Labs  Lab 11/06/17 0102  NA 139  K 4.2  CL 106  CO2 24  GLUCOSE 110*  BUN 11  CREATININE 0.78  CALCIUM 9.5  GFRNONAA >60  GFRAA >60  ANIONGAP 9    Total Protein  Date Value Ref Range Status  03/14/2014 7.0 6.0 - 8.3 g/dL Final   Albumin  Date Value Ref Range Status  03/14/2014 4.0 3.5 - 5.2 g/dL Final   AST  Date Value Ref Range Status  03/14/2014 21 0 - 37 U/L Final   ALT  Date Value Ref Range Status  03/14/2014 32 0 - 35 U/L Final   Alkaline Phosphatase  Date Value Ref Range Status  03/14/2014 88 39 - 117 U/L Final   Total Bilirubin  Date Value Ref Range Status  03/14/2014 0.6 0.2 - 1.2 mg/dL Final   Hematology Recent Labs  Lab 11/06/17 0102  WBC 8.0  RBC 5.34*  HGB 14.9  HCT 46.2*  MCV 86.5  MCH 27.9  MCHC 32.3  RDW 12.5  PLT 230   Cardiac Enzymes Recent Labs  Lab 11/06/17 0418 11/06/17 1018  TROPONINI <0.03 <0.03    Recent Labs  Lab 11/06/17 0109  TROPIPOC 0.00    BNP Recent Labs  Lab 11/06/17 0419  BNP 234.0*    DDimer No results for input(s): DDIMER in the last 168 hours. TSH:  Lab Results  Component Value Date   TSH 1.502 11/06/2017   Lipids: Lab Results  Component Value Date   CHOL 193 03/14/2014   HDL 68 03/14/2014   LDLCALC 105 (H) 03/14/2014   TRIG 102 03/14/2014   CHOLHDL 2.8 03/14/2014   HgbA1c: Lab Results  Component Value Date   HGBA1C 5.9 (H) 11/06/2017    Radiology/Studies:  Dg Chest 2 View  Result Date: 11/06/2017 CLINICAL DATA:  Atrial fibrillation EXAM: CHEST - 2 VIEW COMPARISON:  10/08/2017 FINDINGS: Lungs are clear. No pleural effusion or pneumothorax. The heart is normal in size. Degenerative changes of the visualized thoracolumbar spine. IMPRESSION: No evidence of acute cardiopulmonary disease. Electronically Signed   By: Julian Hy M.D.   On: 11/06/2017 01:19   Assessment and Plan:   1.  Paroxysmal atrial fibrillation, now with RVR: -Patient initially seen on 09/26/2017 and 10/08/2017 at [facility] with complaints of palpitations found to be in SVT then atrial fibrillation with RVR.  She was initially discharged from the ED with instructions to take metoprolol 25 mg twice daily rather than on an as-needed basis (instructed by PCP).  She was referred to the atrial fibrillation clinic for follow-up. During this appointment on 10/13/2017 her metoprolol was uptitrated and she was then referred for general cardiology given that she would be spending the summer/early fall in [place].  She was seen by Dr. Gwenlyn Found on  10/23/2017 and seemed to be doing well on metoprolol (48m in AM and 37.528min the PM) and Eliquis 69m39m -Currently rate controlled on Diltiazem gtt at 69mg77m Her heart rates are now more controlled in the 70 range -Consider transitioning her to Diltiazem PO 120mg46m monitor. Her BP has been stable but soft 101/66, 94/69, 108/60, 104/58 -She reports uninterrupted Eliquis therapy since initiation. Consider DCCV during this admission now that rate is stabilized -CHA2DS2VASc =3 (Age, Sex, CHF-diastolic dysfunction)  2.  HLD:  -Lipid panel pending -Last lipid panel on file from 03/14/2014 with LDL at 105 -Not currently on statin therapy  3.  Familial history of colon cancer/personal history of colon polyps: -Plan was for pt to have routine colonoscopy tomorrow with bowel prep today. She was to hold her Eliquis today, however she has been at the hospital overnight and received her Eliquis today. She reports taking it as scheduled yesterday    For questions or updates, please contact [facility] consult [email or web address] for contact info under Cardiology/STEMI.   Signed, JilKathyrn [facility] Pager: [phone]/2019 1:50 PM

## 2017-11-06 NOTE — Telephone Encounter (Signed)
Ok, no charge  

## 2017-11-06 NOTE — H&P (Addendum)
History and Physical    Kelly Walker PZW:[id number] DOB: 03/30/1946 DOA: 11/06/2017  **Will place patient on observation status based on the expectation that the patient will need hospitalization/ hospital care less than or equal to 24 hours  PCP: Jonathon Jordan, MD   Attending physician: Lorin Mercy  Patient coming from/Resides with: Private residence/husband  Chief Complaint: Palpitations  HPI: Kelly Walker is a 72 y.o. female with medical history significant for known PAF recently diagnosed in April 2019- on Eliquis and beta-blocker prior to admission, history of gout, dyslipidemia.  Patient reports around lunchtime on 5/29 she developed what she thought was "just a fast heart rate."  She did not notice any irregularity so did not think much of it but the symptoms persisted.  She took extra beta-blocker as prescribed but symptoms did not improve.  No other symptoms but she did feel "sweaty".  Upon arrival to the ER patient was found to be in A. fib with RVR with ventricular rate of 120 bpm.  She has been started on Cardizem infusion in the ER 5 mg/h.  BNP slightly elevated at 234 but chest x-ray without evidence of heart failure.  TSH was normal.  Potassium 4.2, troponin normal.  ED Course:  Vital Signs: BP 113/69 (BP Location: Left Arm)   Pulse 75   Temp 98.3 F (36.8 C) (Oral)   Resp 11   Ht _0  (1.651 m)   Wt 90.9 kg (200 lb 6.4 oz)   SpO2 95%   BMI 33.35 kg/m  CXR: Neg Lab data: Sodium 139, potassium 4.2, chloride 106, CO2 24, glucose 110, BUN 11, creatinine 0.78, anion gap 9, BNP 234, troponin normal, TSH 1.50, white count 8000 differential not obtained, hemoglobin 14.9, platelets 230,000 Medications and treatments: Cardizem bolus 10 mg IV x1 followed by an infusion at 5 mg/hr  Review of Systems:  In addition to the HPI above,  No Fever-chills, myalgias or other constitutional symptoms No Headache, changes with Vision or hearing, new weakness, tingling, numbness in any  extremity, dizziness, dysarthria or word finding difficulty, gait disturbance or imbalance, tremors or seizure activity No problems swallowing food or Liquids, indigestion/reflux, choking or coughing while eating, abdominal pain with or after eating No Chest pain, Cough or Shortness of Breath, orthopnea or DOE No Abdominal pain, N/V, melena,hematochezia, dark tarry stools, constipation No dysuria, malodorous urine, hematuria or flank pain No new skin rashes, lesions, masses or bruises, No new joint pains, aches, swelling or redness No recent unintentional weight gain or loss No polyuria, polydypsia or polyphagia   Past Medical History:  Diagnosis Date  . Diverticulosis   . Gout   . Hyperlipidemia   . Menopause   . SBO (small bowel obstruction) ([place]) 11/06/2012  . Small bowel obstruction [facility])     Past Surgical History:  Procedure Laterality Date  . ABDOMINAL HYSTERECTOMY  2001  . APPENDECTOMY  2001  . CATARACT EXTRACTION Bilateral   . TONSILLECTOMY  1957  . UMBILICAL HERNIA REPAIR  2001    Social History   Socioeconomic History  . Marital status: Married    Spouse name: Not on file  . Number of children: Not on file  . Years of education: Not on file  . Highest education level: Not on file  Occupational History  . Not on file  Social Needs  . Financial resource strain: Not on file  . Food insecurity:    Worry: Not on file    Inability: Not on file  .  Transportation needs:    Medical: Not on file    Non-medical: Not on file  Tobacco Use  . Smoking status: Former Smoker    Packs/day: 1.00    Years: 4.00    Pack years: 4.00    Last attempt to quit: 06/10/1972    Years since quitting: 45.4  . Smokeless tobacco: Never Used  Substance and Sexual Activity  . Alcohol use: Yes    Comment: occasional-social  . Drug use: No  . Sexual activity: Yes  Lifestyle  . Physical activity:    Days per week: Not on file    Minutes per session: Not on file  . Stress: Not on  file  Relationships  . Social connections:    Talks on phone: Not on file    Gets together: Not on file    Attends religious service: Not on file    Active member of club or organization: Not on file    Attends meetings of clubs or organizations: Not on file    Relationship status: Not on file  . Intimate partner violence:    Fear of current or ex partner: Not on file    Emotionally abused: Not on file    Physically abused: Not on file    Forced sexual activity: Not on file  Other Topics Concern  . Not on file  Social History Narrative  . Not on file    Mobility: Independent Work history: Not obtained   Allergies  Allergen Reactions  . Lecithin Other (See Comments)    Intestinal distress  . Other     Fluoroquinolones Antibiotics=increased heartbeat,fatigue,lack of focus   . Potassium Nitrate Other (See Comments)    & Potassium Nitrite=Increased heart rate,disorientation   . Sulfa Antibiotics     Joint pain  . Sulfasalazine Other (See Comments)    Joint pain  . Ciprofloxacin Anxiety  . Flagyl [Metronidazole] Anxiety    Family History  Problem Relation Age of Onset  . Heart failure Mother   . Diabetes Mother   . Heart attack Father   . Colon cancer Father 58  . Colon cancer Paternal Uncle 44  . Stomach cancer Cousin      Prior to Admission medications   Medication Sig Start Date End Date Taking? Authorizing Provider  apixaban (ELIQUIS) 5 MG TABS tablet Take 1 tablet (5 mg total) by mouth 2 (two) times daily. 10/08/17  Yes Gareth Morgan, MD  metoprolol tartrate (LOPRESSOR) 25 MG tablet Take 1.5 tablets at night and 1 tablet in the morning. Patient taking differently: Take 25-37.5 mg by mouth See admin instructions. Take 1 tablet every morning and take Take 1.5 tablets at night 10/08/17  Yes Schlossman, Junie Panning, MD  Probiotic Product (PROBIOTIC DAILY PO) Take 1 capsule by mouth daily.    Yes [provider]  Na Sulfate-K Sulfate-Mg Sulf (SUPREP BOWEL PREP  KIT) 17.5-3.13-1.6 GM/177ML SOLN Take 1 kit by mouth as directed. Patient not taking: Reported on 11/06/2017 10/28/17   Levin Erp, [place]    Physical Exam: Vitals:   11/06/17 0515 11/06/17 0600 11/06/17 0608 11/06/17 0736  BP: 117/85 (!) 138/114  113/69  Pulse: 87 91  75  Resp: _0 Temp:    98.3 F (36.8 C)  TempSrc:    Oral  SpO2: 95% 97%  95%  Weight:   90.9 kg (200 lb 6.4 oz)   Height:   _1  (1.651 m)       Constitutional: NAD,  calm, comfortable Eyes: PERRL, lids and conjunctivae normal ENMT: Mucous membranes are moist. Posterior pharynx clear of any exudate or lesions.Normal dentition.  Neck: normal, supple, no masses, no thyromegaly Respiratory: clear to auscultation bilaterally, no wheezing, no crackles. Normal respiratory effort. No accessory muscle use.  Cardiovascular: Irregular rhythm-atrial fibrillation-VR 60s to 70s, no murmurs / rubs / gallops. No extremity edema. 2+ pedal pulses. No carotid bruits.  Abdomen: no tenderness, no masses palpated. No hepatosplenomegaly. Bowel sounds positive.  Musculoskeletal: no clubbing / cyanosis. No joint deformity upper and lower extremities. Good ROM, no contractures. Normal muscle tone.  Skin: no rashes, lesions, ulcers. No induration Neurologic: CN 2-12 grossly intact. Sensation intact, DTR normal. Strength 5/5 x all 4 extremities.  Psychiatric: Normal judgment and insight. Alert and oriented x 3. Normal mood.    Labs on Admission: I have personally reviewed following labs and imaging studies  CBC: Recent Labs  Lab 11/06/17 0102  WBC 8.0  HGB 14.9  HCT 46.2*  MCV 86.5  PLT 935   Basic Metabolic Panel: Recent Labs  Lab 11/06/17 0102  NA 139  K 4.2  CL 106  CO2 24  GLUCOSE 110*  BUN 11  CREATININE 0.78  CALCIUM 9.5   GFR: Estimated Creatinine Clearance: 72.9 mL/min (by C-G formula based on SCr of 0.78 mg/dL). Liver Function Tests: No results for input(s): AST, ALT, ALKPHOS, BILITOT, PROT,  ALBUMIN in the last 168 hours. No results for input(s): LIPASE, AMYLASE in the last 168 hours. No results for input(s): AMMONIA in the last 168 hours. Coagulation Profile: No results for input(s): INR, PROTIME in the last 168 hours. Cardiac Enzymes: Recent Labs  Lab 11/06/17 0418  TROPONINI <0.03   BNP (last 3 results) No results for input(s): PROBNP in the last 8760 hours. HbA1C: No results for input(s): HGBA1C in the last 72 hours. CBG: No results for input(s): GLUCAP in the last 168 hours. Lipid Profile: No results for input(s): CHOL, HDL, LDLCALC, TRIG, CHOLHDL, LDLDIRECT in the last 72 hours. Thyroid Function Tests: Recent Labs    11/06/17 0500  TSH 1.502   Anemia Panel: No results for input(s): VITAMINB12, FOLATE, FERRITIN, TIBC, IRON, RETICCTPCT in the last 72 hours. Urine analysis:    Component Value Date/Time   BILIRUBINUR neg 08/06/2011 1119   PROTEINUR neg 08/06/2011 1119   UROBILINOGEN negative 08/06/2011 1119   NITRITE neg 08/06/2011 1119   LEUKOCYTESUR Negative 08/06/2011 1119   Sepsis Labs: _0 (procalcitonin:4,lacticidven:4) )No results found for this or any previous visit (from the past 240 hour(s)).   Radiological Exams on Admission: Dg Chest 2 View  Result Date: 11/06/2017 CLINICAL DATA:  Atrial fibrillation EXAM: CHEST - 2 VIEW COMPARISON:  10/08/2017 FINDINGS: Lungs are clear. No pleural effusion or pneumothorax. The heart is normal in size. Degenerative changes of the visualized thoracolumbar spine. IMPRESSION: No evidence of acute cardiopulmonary disease. Electronically Signed   By: Julian Hy M.D.   On: 11/06/2017 01:19    EKG: (Independently reviewed) atrial fibrillation with ventricular rate 116 bpm, QTC 425 ms, normal R wave rotation, no acute ischemic changes  Assessment/Plan Principal Problem:   Atrial fibrillation with RVR  -Known history of PAF on beta-blocker/Eliquis prior to admission who presents with palpitations that  have persisted despite extra dosage of beta-blocker and was found to have RVR -Current VR's stable in the 60-70 range on Cardizem infusion at 5 mg/hr-likely can convert to oral agent later today -Cardiology consulted in the event need to either increase BB dosage or  change to alternative agent (followed by Dr. Gwenlyn Found in the outpatient setting) -CHA2DS2-VASc=3 (has grade 1 diastolic dysfunction but no evidence of prior heart failure exacerbation therefore did not give 1 point for heart failure) -Continue Eliquis -Continue telemetry -TSH normal -Echocardiogram completed as an outpatient on 10/14/2017: Mild concentric hypertrophy, EF 60 to 62%, grade 1 diastolic dysfunction, mild MR, trivial TR  Active Problems:   Mild concentric hypertrophy/grade 1 diastolic dysfunction -No history of heart failure exacerbation -Primary focus should be hypertensive control at this juncture    HLD (hyperlipidemia) -Not on medications prior to admission -Cholesterol 193, HDL 68, LDL 105, triglycerides 102 on 03/14/2014 -Repeat lipid panel this admission    Family history of colon cancer/personal history of colon polyps -Patient had been scheduled to start outpatient colon prep today, hold Eliquis and proceed with her surveillance colonoscopy on 5/31 but given recent issues with PAF requirement for hospitalization GI office has been contacted and I have also discussed with Nancy Marus, PA-C. We both agree that no indication to pursue inpatient colonoscopy given this is a surveillance colonoscopy.  Colonoscopy has been canceled for now with plans to follow-up with Dr. Silverio Decamp at a later date after discharge    **Additional lab, imaging and/or diagnostic evaluation at discretion of supervising physician  DVT prophylaxis: Eliquis Code Status: Full Family Communication: No family at bedside Disposition Plan: Home Consults called: Cardiology/[facility] on call    ELLIS,ALLISON L. ANP-BC [facility] Pager  [phone]   If 7PM-7AM, please contact night-coverage [email or web address] Password [facility]  11/06/2017, 8:49 AM

## 2017-11-06 NOTE — ED Triage Notes (Addendum)
Pt with a recent dx of afib. On eliquis and metoprolol.  Has been to afib clinic.  Today at lunch she felt herself go into afib and took the clinic's recommendation to take half an extra beta blocker but by dinner it had recurred. She took her regular dose at dinner and symptoms remain.  No SHOB, nausea, but does endorse feeling "sweaty"

## 2017-11-06 NOTE — Telephone Encounter (Signed)
Patient notified. She is presently in the hospital. She is in A-Fib. Never got to start the prep. Cancelling the procedure on Friday.

## 2017-11-06 NOTE — Telephone Encounter (Signed)
Doesn't interact and is not absorbed but if patient is worries can switch to alternative prep

## 2017-11-07 ENCOUNTER — Encounter (HOSPITAL_COMMUNITY)
Admission: EM | Disposition: A | Discharge status: Home / Self Care | Payer: Self-pay | Source: Home / Self Care | Attending: Emergency Medicine

## 2017-11-07 ENCOUNTER — Encounter (HOSPITAL_COMMUNITY): Payer: Self-pay | Admitting: Anesthesiology

## 2017-11-07 ENCOUNTER — Observation Stay (HOSPITAL_COMMUNITY): Payer: Medicare Other | Admitting: Anesthesiology

## 2017-11-07 ENCOUNTER — Encounter: Payer: Medicare Other | Admitting: Gastroenterology

## 2017-11-07 DIAGNOSIS — E7800 Pure hypercholesterolemia, unspecified: Secondary | ICD-10-CM

## 2017-11-07 DIAGNOSIS — Z87891 Personal history of nicotine dependence: Secondary | ICD-10-CM | POA: Diagnosis not present

## 2017-11-07 DIAGNOSIS — Z79899 Other long term (current) drug therapy: Secondary | ICD-10-CM | POA: Diagnosis not present

## 2017-11-07 DIAGNOSIS — I480 Paroxysmal atrial fibrillation: Secondary | ICD-10-CM | POA: Diagnosis not present

## 2017-11-07 DIAGNOSIS — I4891 Unspecified atrial fibrillation: Secondary | ICD-10-CM | POA: Diagnosis not present

## 2017-11-07 DIAGNOSIS — Z7901 Long term (current) use of anticoagulants: Secondary | ICD-10-CM | POA: Diagnosis not present

## 2017-11-07 HISTORY — PX: CARDIOVERSION: SHX1299

## 2017-11-07 LAB — BASIC METABOLIC PANEL
Anion gap: 7 (ref 5–15)
BUN: 6 mg/dL (ref 6–20)
CO2: 28 mmol/L (ref 22–32)
Calcium: 9.2 mg/dL (ref 8.9–10.3)
Chloride: 106 mmol/L (ref 101–111)
Creatinine, Ser: 0.86 mg/dL (ref 0.44–1.00)
GLUCOSE: 110 mg/dL — AB (ref 65–99)
POTASSIUM: 4.1 mmol/L (ref 3.5–5.1)
Sodium: 141 mmol/L (ref 135–145)

## 2017-11-07 LAB — LIPID PANEL
CHOL/HDL RATIO: 3.5 ratio
Cholesterol: 168 mg/dL (ref 0–200)
HDL: 48 mg/dL (ref >40)
LDL CALC: 105 mg/dL — AB (ref 0–99)
TRIGLYCERIDES: 74 mg/dL (ref <150)
VLDL: 15 mg/dL (ref 0–40)

## 2017-11-07 LAB — HIV ANTIBODY (ROUTINE TESTING W REFLEX): HIV Screen 4th Generation wRfx: NONREACTIVE

## 2017-11-07 SURGERY — CARDIOVERSION
Anesthesia: General

## 2017-11-07 MED ORDER — DILTIAZEM HCL ER COATED BEADS 120 MG PO CP24: 120.0000 mg | ORAL_CAPSULE | Freq: Every day | ORAL | 3 refills | Status: DC

## 2017-11-07 MED ORDER — LIDOCAINE 2% (20 MG/ML) 5 ML SYRINGE
INTRAMUSCULAR | Status: DC | PRN
  Administered 2017-11-07: 40 mg via INTRAVENOUS

## 2017-11-07 MED ORDER — ONDANSETRON HCL 4 MG/2ML IJ SOLN: 4.0000 mg | Freq: Once | INTRAMUSCULAR | Status: DC | PRN

## 2017-11-07 MED ORDER — PROPOFOL 10 MG/ML IV BOLUS
INTRAVENOUS | Status: DC | PRN
  Administered 2017-11-07: 50 mg via INTRAVENOUS

## 2017-11-07 MED ORDER — METOPROLOL TARTRATE 25 MG PO TABS: 12.5000 mg | ORAL_TABLET | Freq: Two times a day (BID) | ORAL | 3 refills | Status: DC

## 2017-11-07 NOTE — Progress Notes (Addendum)
Explained and discussed discharge instructions, prescriptions and follow up appts. Given to patient. Patient going home via w/c with husband and belongings. Unable to schedule appointment due to office being closed pt to call and schedule appointment Monday.

## 2017-11-07 NOTE — Progress Notes (Addendum)
Progress Note  Patient Name: Kelly Walker Date of Encounter: 11/07/2017  Primary Cardiologist: Quay Burow, MD   Subjective   Denies any chest pain or SOB.  S/P successful DCCV earlier this am  Inpatient Medications    Scheduled Meds: . apixaban  5 mg Oral BID  . diltiazem  120 mg Oral Daily  . metoprolol tartrate  12.5 mg Oral BID  . sodium chloride flush  3 mL Intravenous Q12H   Continuous Infusions: . sodium chloride 10 mL/hr at 11/06/17 0845  . sodium chloride Stopped (11/07/17 0837)   PRN Meds: acetaminophen, ondansetron (ZOFRAN) IV, sodium chloride flush, zolpidem   Vital Signs    Vitals:   11/07/17 0718 11/07/17 0836 11/07/17 0845 11/07/17 1138  BP: 137/87 104/69 113/68 140/76  Pulse:   69 70  Resp: 20 16 20  (!) 21  Temp: 97.9 F (36.6 C) 98.1 F (36.7 C)  97.8 F (36.6 C)  TempSrc: Oral Oral  Oral  SpO2: 97% 97% 95% 100%  Weight: 195 lb (88.5 kg)     Height: 5\' 5"  (1.651 m)       Intake/Output Summary (Last 24 hours) at 11/07/2017 1210 Last data filed at 11/07/2017 1100 Gross per 24 hour  Intake 429.91 ml  Output -  Net 429.91 ml   Filed Weights   11/06/17 0608 11/07/17 0313 11/07/17 0718  Weight: 200 lb 6.4 oz (90.9 kg) 195 lb 15.8 oz (88.9 kg) 195 lb (88.5 kg)    Telemetry    NSR - Personally Reviewed  ECG    NSR - Personally Reviewed  Physical Exam   GEN: No acute distress.   Neck: No JVD Cardiac: RRR, no murmurs, rubs, or gallops.  Respiratory: Clear to auscultation bilaterally. GI: Soft, nontender, non-distended  MS: No edema; No deformity. Neuro:  Nonfocal  Psych: Normal affect   Labs    Chemistry Recent Labs  Lab 11/06/17 0102 11/07/17 0151  NA 139 141  K 4.2 4.1  CL 106 106  CO2 24 28  GLUCOSE 110* 110*  BUN 11 6  CREATININE 0.78 0.86  CALCIUM 9.5 9.2  GFRNONAA >60 >60  GFRAA >60 >60  ANIONGAP 9 7     Hematology Recent Labs  Lab 11/06/17 0102  WBC 8.0  RBC 5.34*  HGB 14.9  HCT 46.2*  MCV 86.5    MCH 27.9  MCHC 32.3  RDW 12.5  PLT 230    Cardiac Enzymes Recent Labs  Lab 11/06/17 0418 11/06/17 1018 11/06/17 1703  TROPONINI <0.03 <0.03 <0.03    Recent Labs  Lab 11/06/17 0109  TROPIPOC 0.00     BNP Recent Labs  Lab 11/06/17 0419  BNP 234.0*     DDimer No results for input(s): DDIMER in the last 168 hours.   Radiology    Dg Chest 2 View  Result Date: 11/06/2017 CLINICAL DATA:  Atrial fibrillation EXAM: CHEST - 2 VIEW COMPARISON:  10/08/2017 FINDINGS: Lungs are clear. No pleural effusion or pneumothorax. The heart is normal in size. Degenerative changes of the visualized thoracolumbar spine. IMPRESSION: No evidence of acute cardiopulmonary disease. Electronically Signed   By: Julian Hy M.D.   On: 11/06/2017 01:19    Cardiac Studies   2D echo 10/14/2017 Study Conclusions  - Left ventricle: The cavity size was normal. There was mild   concentric hypertrophy. Systolic function was normal. The   estimated ejection fraction was in the range of 60% to 65%. Wall   motion was normal; there  were no regional wall motion   abnormalities. Doppler parameters are consistent with abnormal   left ventricular relaxation (grade 1 diastolic dysfunction).   Doppler parameters are consistent with indeterminate ventricular   filling pressure. - Aortic valve: Transvalvular velocity was within the normal range.   There was no stenosis. There was mild regurgitation. - Mitral valve: Transvalvular velocity was within the normal range.   There was no evidence for stenosis. There was mild regurgitation. - Right ventricle: The cavity size was normal. Wall thickness was   normal. Systolic function was normal. - Atrial septum: No defect or patent foramen ovale was identified. - Tricuspid valve: There was trivial regurgitation. - Pulmonary arteries: Systolic pressure was within the normal   range. - Global longitudinal strain -19.4% (normal).  Patient Profile     72 y.o.  female who was recently diagnosed with SVT after complaining of palpitations on 09/26/2017 and presented to the emergency room where EKG showed SVT.  She then had a subsequent episode of palpitations on 10/08/2017 and presented to the emergency room and was in atrial fibrillation with RVR.  She was started on metoprolol and Eliquis and a 2D echocardiogram showed normal LV function.  She was referred to the A. fib clinic and establish care with Dr. Gwenlyn Found.  She has been compliant with her Eliquis without any missed doses.  Yesterday she developed palpitations again and noticed that her heartbeat was irregular.  She took an extra half dose of her metoprolol but her symptoms persisted and then became diaphoretic.  She denied any chest pain or pressure, shortness of breath or dizziness.  She presented to the emergency room today and was back in atrial fibrillation.  Assessment & Plan    1.  Paroxysmal atrial fibrillation, now with RVR: -Patient initially seen on 09/26/2017 and 10/08/2017 at [facility] with complaints of palpitations found to be in SVT then atrial fibrillation with RVR.  She was initially discharged from the ED with instructions to take metoprolol 25 mg twice daily rather than on an as-needed basis (instructed by PCP).  She was referred to the atrial fibrillation clinic for follow-up. During this appointment on 10/13/2017 her metoprolol was uptitrated and she was then referred for general cardiology given that she would be spending the summer/early fall in [place].  She was seen by Dr. Gwenlyn Found on 10/23/2017 and seemed to be doing well on metoprolol (25mg  in AM and 37.5mg  in the PM) and Eliquis 5mg  . -She reports uninterrupted Eliquis therapy since initiation. -CHA2DS2VASc =3 (Age, Sex, CHF-diastolic dysfunction) -s/p successful DCCV this am to NSR -continue Cardizem CD 120mg  daily and increase lopressor back to home dose of 25mg  qam and 37.5mg  qpm as HR is going up in 90's with ambulation and we have room with BP  to titrate heart meds for better suppression of PAF  2.  HLD:  -LDL 105 -Last lipid panel on file from 03/14/2014 with LDL at 105 -Not currently on statin therapy  3.  Familial history of colon cancer/personal history of colon polyps: -Plan was for pt to have routine colonoscopy today with bowel prep today. She was to hold her Eliquis yesterday but was in the ER overnight and received her Eliquis as presribed twice yesterday and has not missed any doses in the past 4 weeks -she will need to be on uninterrupted Eliquis for 4 weeks prior to being able to come off for colonoscopy  She is stable from a cardiac standpoint for discharge.  Will have her followup  in afib clinic.  Discharge cardiac meds include: Lopressor 12.5mg  BID and Cardizem CD 120mg  daily as well as Eliquis 5mg  BID.  Will sign off.   For questions or updates, please contact [place] Please consult [email or web address] for contact info under Cardiology/STEMI.      Signed, Fransico Him, MD  11/07/2017, 12:10 PM

## 2017-11-07 NOTE — Anesthesia Preprocedure Evaluation (Addendum)
Anesthesia Evaluation  Patient identified by MRN, date of birth, ID band Patient awake    Reviewed: Allergy & Precautions, NPO status , Patient's Chart, lab work & pertinent test results, reviewed documented beta blocker date and time   Airway Mallampati: II  TM Distance: >3 FB Neck ROM: Full    Dental  (+) Caps, Upper Dentures   Pulmonary former smoker,    Pulmonary exam normal breath sounds clear to auscultation       Cardiovascular Normal cardiovascular exam+ dysrhythmias Atrial Fibrillation  Rhythm:Irregular Rate:Normal  Transitioned from Cardizem drip to po yesterday. Last dose of Cardizem yesterday pm   Neuro/Psych negative neurological ROS  negative psych ROS   GI/Hepatic negative GI ROS, Neg liver ROS,   Endo/Other  Gout Hyperlipidemia Obesity  Renal/GU negative Renal ROS     Musculoskeletal  (+) Arthritis , Osteoarthritis,    Abdominal (+) + obese,   Peds  Hematology Eliquis- last dose yesterday pm   Anesthesia Other Findings   Reproductive/Obstetrics                            Anesthesia Physical Anesthesia Plan  ASA: III  Anesthesia Plan: General   Post-op Pain Management:    Induction: Intravenous  PONV Risk Score and Plan: Treatment may vary due to age or medical condition and Ondansetron  Airway Management Planned: Mask  Additional Equipment:   Intra-op Plan:   Post-operative Plan:   Informed Consent: I have reviewed the patients History and Physical, chart, labs and discussed the procedure including the risks, benefits and alternatives for the proposed anesthesia with the patient or authorized representative who has indicated his/her understanding and acceptance.     Plan Discussed with: CRNA and Surgeon  Anesthesia Plan Comments:         Anesthesia Quick Evaluation

## 2017-11-07 NOTE — Plan of Care (Signed)
Continue current care plan 

## 2017-11-07 NOTE — Transfer of Care (Signed)
Immediate Anesthesia Transfer of Care Note  Patient: Kelly Walker  Procedure(s) Performed: CARDIOVERSION (N/A )  Patient Location: Endoscopy Unit  Anesthesia Type:General  Level of Consciousness: awake, alert  and oriented  Airway & Oxygen Therapy: Patient Spontanous Breathing  Post-op Assessment: Report given to RN and Post -op Vital signs reviewed and stable  Post vital signs: Reviewed and stable  Last Vitals:  Vitals Value Taken Time  BP    Temp    Pulse    Resp    SpO2      Last Pain:  Vitals:   11/07/17 0718  TempSrc: Oral  PainSc: 0-No pain         Complications: No apparent anesthesia complications

## 2017-11-07 NOTE — Interval H&P Note (Signed)
History and Physical Interval Note:  11/07/2017 8:24 AM  Kelly Walker  has presented today for surgery, with the diagnosis of a fib  The various methods of treatment have been discussed with the patient and family. After consideration of risks, benefits and other options for treatment, the patient has consented to  Procedure(s): CARDIOVERSION (N/A) as a surgical intervention .  The patient's history has been reviewed, patient examined, no change in status, stable for surgery.  I have reviewed the patient's chart and labs.  Questions were answered to the patient's satisfaction.     Elinor Kleine [employer]

## 2017-11-07 NOTE — Procedures (Signed)
Electrical Cardioversion Procedure Note Kelly Walker [id number] 05-Oct-1945  Procedure: Electrical Cardioversion Indications:  Atrial Fibrillation  Procedure Details Consent: Risks of procedure as well as the alternatives and risks of each were explained to the (patient/caregiver).  Consent for procedure obtained. Time Out: Verified patient identification, verified procedure, site/side was marked, verified correct patient position, special equipment/implants available, medications/allergies/relevent history reviewed, required imaging and test results available.  Performed  Patient placed on cardiac monitor, pulse oximetry, supplemental oxygen as necessary.  Sedation given: Propofol per anesthesiology Pacer pads placed anterior and posterior chest.  Cardioverted 1 time(s).  Cardioverted at [place].  Evaluation Findings: Post procedure EKG shows: NSR Complications: None Patient did tolerate procedure well.   Loralie Champagne 11/07/2017, 8:29 AM

## 2017-11-07 NOTE — Anesthesia Postprocedure Evaluation (Signed)
Anesthesia Post Note  Patient: Kelly Walker  Procedure(s) Performed: CARDIOVERSION (N/A )     Patient location during evaluation: PACU Anesthesia Type: General Level of consciousness: awake and alert Pain management: pain level controlled Vital Signs Assessment: post-procedure vital signs reviewed and stable Respiratory status: spontaneous breathing, nonlabored ventilation and respiratory function stable Cardiovascular status: blood pressure returned to baseline and stable Postop Assessment: no apparent nausea or vomiting Anesthetic complications: no    Last Vitals:  Vitals:   11/07/17 0836 11/07/17 0845  BP: 104/69 113/68  Pulse:  69  Resp: 16 20  Temp: 36.7 C   SpO2: 97% 95%    Last Pain:  Vitals:   11/07/17 0845  TempSrc:   PainSc: 0-No pain                 [place] A.

## 2017-11-07 NOTE — Anesthesia Procedure Notes (Signed)
Procedure Name: General with mask airway Performed by: Carver Fila, RN Pre-anesthesia Checklist: Patient identified, Emergency Drugs available, Suction available, Patient being monitored and Timeout performed Patient Re-evaluated:Patient Re-evaluated prior to induction Oxygen Delivery Method: Ambu bag Preoxygenation: Pre-oxygenation with 100% oxygen Induction Type: IV induction Dental Injury: Teeth and Oropharynx as per pre-operative assessment

## 2017-11-07 NOTE — Plan of Care (Signed)
Pt being discharge home remains in [facility].

## 2017-11-07 NOTE — Discharge Summary (Signed)
Kelly Walker, is a 72 y.o. female  DOB February 01, 1946  MRN [id number].  Admission date:  11/06/2017  Admitting Physician  Karmen Bongo, MD  Discharge Date:  11/07/2017   Primary MD  Jonathon Jordan, MD  Recommendations for primary care physician for things to follow:   1)Followup in afib/atrial fibrillation clinic as scheduled  2)Take Metoprolol/Lopressor 12.70m twice a day   3)Take Cardizem CD 1254mdaily   4) take Eliquis 64m14mID  5)Avoid ibuprofen/Advil/Aleve/Motrin/Goody Powders/Naproxen/BC powders/Meloxicam/Diclofenac/Indomethacin and other Nonsteroidal anti-inflammatory medications as these will make you more likely to bleed and can cause stomach ulcers, can also cause Kidney problems.   6)Very low-salt diet advised  7) follow-up with Dr. NanSilverio Decampom gastroenterology service in 4 to 6 weeks--- you cannot have your colonoscopy yet as you have to be on Eliquis for at least another 4 weeks after you cardioversion   Admission Diagnosis  AFIB   Discharge Diagnosis  AFIB    Principal Problem:   Atrial fibrillation with RVR (HCC[facility]ierctive Problems:   HLD (hyperlipidemia)      Past Medical History:  Diagnosis Date  . Atrial fibrillation ([facility]  on Eliquis  . Diverticulosis   . Gout   . Grade I diastolic dysfunction   . Hyperlipidemia   . Menopause   . SBO (small bowel obstruction) ([facility]/30/2014    Past Surgical History:  Procedure Laterality Date  . ABDOMINAL HYSTERECTOMY  2001  . APPENDECTOMY  2001  . CATARACT EXTRACTION Bilateral   . TONSILLECTOMY  1957  . UMBILICAL HERNIA REPAIR  2001       HPI  from the history and physical done on the day of admission:    Patient coming from/Resides with: Private residence/husband  Chief Complaint: Palpitations  HPI: Kelly Walker a 72 63o. female with medical history significant for known PAF recently diagnosed in April 2019- on  Eliquis and beta-blocker prior to admission, history of gout, dyslipidemia.  Patient reports around lunchtime on 5/29 she developed what she thought was "just a fast heart rate."  She did not notice any irregularity so did not think much of it but the symptoms persisted.  She took extra beta-blocker as prescribed but symptoms did not improve.  No other symptoms but she did feel "sweaty".  Upon arrival to the ER patient was found to be in A. fib with RVR with ventricular rate of 120 bpm.  She has been started on Cardizem infusion in the ER 5 mg/h.  BNP slightly elevated at 234 but chest x-ray without evidence of heart failure.  TSH was normal.  Potassium 4.2, troponin normal.  ED Course:  Vital Signs: BP 113/69 (BP Location: Left Arm)   Pulse 75   Temp 98.3 F (36.8 C) (Oral)   Resp 11   Ht _0  (1.651 m)   Wt 90.9 kg (200 lb 6.4 oz)   SpO2 95%   BMI 33.35 kg/m  CXR: Neg Lab data: Sodium 139, potassium 4.2, chloride 106, CO2 24, glucose 110,  BUN 11, creatinine 0.78, anion gap 9, BNP 234, troponin normal, TSH 1.50, white count 8000 differential not obtained, hemoglobin 14.9, platelets 230,000 Medications and treatments: Cardizem bolus 10 mg IV x1 followed by an infusion at 5 mg/hr    Hospital Course:    Brief summary Patient initially seen on 09/26/2017 and [facility]ations found to be in SVT thenatrial fibrillation with RVR. She was initially discharged from the ED with instructions to take metoprolol 25 mg twice daily rather than on an as-needed basis (instructed by PCP).She was referred to the atrial fibrillation clinic for follow-up. During this appointment on 10/13/2017 her metoprolol was uptitrated and shewasthen referred for general cardiology given that she would be spending the summer/early fall in [place]. She was seen by Dr. Gwenlyn Found on 5/16/2019and seemed to be doing well on metoprolol(32m in AM and 37.596min the PM)and Eliquis 23m26m-She  reports uninterrupted Eliquis therapy since initiation prior to cardioversion.   Plan:-  1.Paroxysmal atrial fibrillation,now with RVR:--- s/p successful DCCV  to NS[facility] -continue Cardizem CD 120m77mily and  lopressor 12.5 mg bid, may titrate Lopressor up once BP improves  for better suppression of PAF.  EF is 60 to 65%. CHA2DS2-VASc=3 (has grade 1 diastolic dysfunction but no evidence of prior heart failure exacerbation therefore did not give 1 point for heart failure).  TSH is normal  2. Familial history of colon cancer/personal history of colon polyps:- follow-up with Dr. NandSilverio Decampm gastroenterology service in 4 to 6 weeks--- you cannot have your colonoscopy yet as you have to be on Eliquis for at least another 4 weeks after you cardioversion, she will need to be on uninterrupted Eliquis for 4 weeks prior to being able to come off for colonoscopy   Discharge Condition: stable  Follow UP  Follow-up Information    [facility]   Specialty:  Cardiology Why:  Please follow up in the Afib clinic next week. A message has been sent to the clinic to call you on Monday to arrange. If you do not receive a call, please call them.  Contact information: [phone]       Consults obtained -cardiology Diet and Activity recommendation:  As advised  Discharge Instructions     Discharge Instructions    (HEARChilchinbitoll MD:  Anytime you have any of the following symptoms: 1) 3 pound weight gain in 24 hours or 5 pounds in 1 week 2) shortness of breath, with or without a dry hacking cough 3) swelling in the hands, feet or stomach 4) if you have to sleep on extra pillows at night in order to breathe.   Complete by:  As directed    Call MD for:  difficulty breathing, headache or visual disturbances   Complete by:  As directed    Call MD for:   persistant dizziness or light-headedness   Complete by:  As directed    Call MD for:  persistant nausea and vomiting   Complete by:  As directed    Call MD for:  temperature >100.4   Complete by:  As directed    Diet - low sodium heart healthy   Complete by:  As directed    Discharge instructions   Complete by:  As directed    1)Followup in afib/atrial fibrillation clinic as scheduled  2)Take Metoprolol/Lopressor 12.23mg 39mce a day  3)Take Cardizem CD 138m daily   4) take Eliquis 567mBID  5)Avoid ibuprofen/Advil/Aleve/Motrin/Goody Powders/Naproxen/BC powders/Meloxicam/Diclofenac/Indomethacin and other Nonsteroidal anti-inflammatory medications as these will make you more likely to bleed and can cause stomach ulcers, can also cause Kidney problems.   6)Very low-salt diet advised  7) follow-up with Dr. NaSilverio Decamprom gastroenterology service in 4 to 6 weeks--- you cannot have your colonoscopy yet as you have to be on Eliquis for at least another 4 weeks after you cardioversion   Increase activity slowly   Complete by:  As directed         Discharge Medications     Allergies as of 11/07/2017      Reactions   Lecithin Other (See Comments)   Intestinal distress   Other    Fluoroquinolones Antibiotics=increased heartbeat,fatigue,lack of focus    Potassium Nitrate Other (See Comments)   & Potassium Nitrite=Increased heart rate,disorientation    Sulfa Antibiotics    Joint pain   Sulfasalazine Other (See Comments)   Joint pain   Ciprofloxacin Anxiety   Flagyl [metronidazole] Anxiety      Medication List    TAKE these medications   apixaban 5 MG Tabs tablet Commonly known as:  ELIQUIS Take 1 tablet (5 mg total) by mouth 2 (two) times daily.   diltiazem 120 MG 24 hr capsule Commonly known as:  CARDIZEM CD Take 1 capsule (120 mg total) by mouth daily. Start taking on:  11/08/2017   metoprolol tartrate 25 MG tablet Commonly known as:  LOPRESSOR Take 0.5 tablets (12.5 mg  total) by mouth 2 (two) times daily. What changed:    how much to take  how to take this  when to take this  additional instructions   Na Sulfate-K Sulfate-Mg Sulf 17.5-3.13-1.6 GM/177ML Soln Commonly known as:  SUPREP BOWEL PREP KIT Take 1 kit by mouth as directed.   PROBIOTIC DAILY PO Take 1 capsule by mouth daily.       Major procedures and Radiology Reports - PLEASE review detailed and final reports for all details, in brief -   Dg Chest 2 View  Result Date: 11/06/2017 CLINICAL DATA:  Atrial fibrillation EXAM: CHEST - 2 VIEW COMPARISON:  10/08/2017 FINDINGS: Lungs are clear. No pleural effusion or pneumothorax. The heart is normal in size. Degenerative changes of the visualized thoracolumbar spine. IMPRESSION: No evidence of acute cardiopulmonary disease. Electronically Signed   By: SrJulian Hy.D.   On: 11/06/2017 01:19   Dg Chest 2 View  Result Date: 10/08/2017 CLINICAL DATA:  [age]/o F; patient felt heart racing on and off for 2 days. Pain below the right scapula. EXAM: CHEST - 2 VIEW COMPARISON:  09/26/2017 chest radiograph. FINDINGS: Normal cardiac silhouette. Aortic atherosclerosis with calcification. Clear lungs. No pleural effusion or pneumothorax. Mild multilevel degenerative changes of the thoracic spine. IMPRESSION: No acute pulmonary process identified. Electronically Signed   By: LaKristine Garbe.D.   On: 10/08/2017 21:21   Dg Thoracic Spine 2 View  Result Date: 10/08/2017 CLINICAL DATA:  [age]/o  F; pain below the right scapula. EXAM: THORACIC SPINE 2 VIEWS COMPARISON:  None. FINDINGS: There is no evidence of thoracic spine fracture. Alignment is normal. Multilevel discogenic degenerative changes of the thoracic spine. Flowing anterior ossification across multiple segments compatible with DISH. IMPRESSION: 1. No acute fracture or dislocation. 2. Findings of DISH and mild multilevel discogenic degenerative changes. Electronically Signed   By: LaKristine Garbe.D.   On: 10/08/2017 21:22  Micro Results    Recent Results (from the past 240 hour(s))  MRSA PCR Screening     Status: None   Collection Time: 11/06/17  6:38 AM  Result Value Ref Range Status   MRSA by PCR NEGATIVE NEGATIVE Final    Comment:        The GeneXpert MRSA Assay (FDA approved for NASAL specimens only), is one component of a comprehensive MRSA colonization surveillance program. It is not intended to diagnose MRSA infection nor to guide or monitor treatment for MRSA infections. Performed at [facility], [place] [address], [place], [place] [address]        Today   Subjective    Essynce Munsch today has no new concerns,           Patient has been seen and examined prior to discharge   Objective   Blood pressure 113/73, pulse 80, temperature 97.9 F (36.6 C), temperature source Tympanic, resp. rate 16, height _0  (1.651 m), weight 88.5 kg (195 lb), SpO2 98 %.   Intake/Output Summary (Last 24 hours) at 11/07/2017 1730 Last data filed at 11/07/2017 1500 Gross per 24 hour  Intake 616.94 ml  Output -  Net 616.94 ml    Exam Gen:- Awake Alert,  In no apparent distress  HEENT:- [place].AT, No sclera icterus Neck-Supple Neck,No JVD,.  Lungs-  CTAB , good air movement CV- S1, S2 normal, regular with a heart rate around 80 Abd-  +ve B.Sounds, Abd Soft, No tenderness,    Extremity/Skin:- No  edema,   good pulses Psych-affect is appropriate, oriented x3 Neuro-no new focal deficits, no tremors   Data Review   CBC w Diff:  Lab Results  Component Value Date   WBC 8.0 11/06/2017   HGB 14.9 11/06/2017   HCT 46.2 (H) 11/06/2017   PLT 230 11/06/2017   LYMPHOPCT 31 03/14/2014   MONOPCT 7 03/14/2014   EOSPCT 4 03/14/2014   BASOPCT 0 03/14/2014    CMP:  Lab Results  Component Value Date   NA 141 11/07/2017   K 4.1 11/07/2017   CL 106 11/07/2017   CO2 28 11/07/2017   BUN 6 11/07/2017   CREATININE 0.86 11/07/2017   CREATININE 0.79  03/14/2014   PROT 7.0 03/14/2014   ALBUMIN 4.0 03/14/2014   BILITOT 0.6 03/14/2014   ALKPHOS 88 03/14/2014   AST 21 03/14/2014   ALT 32 03/14/2014  .   Total Discharge time is about 33 minutes  Roxan Hockey M.D on 11/07/2017 at 5:30 PM  [facility]   Office  [phone]  Voice Recognition Viviann Spare dictation system was used to create this note, attempts have been made to correct errors. Please contact the author with questions and/or clarifications.

## 2017-11-07 NOTE — Discharge Instructions (Addendum)
Atrial Fibrillation Atrial fibrillation is a type of heartbeat that is irregular or fast (rapid). If you have this condition, your heart keeps quivering in a weird (chaotic) way. This condition can make it so your heart cannot pump blood normally. Having this condition gives a person more risk for stroke, heart failure, and other heart problems. There are different types of atrial fibrillation. Talk with your doctor to learn about the type that you have. Follow these instructions at home:  Take over-the-counter and prescription medicines only as told by your doctor.  If your doctor prescribed a blood-thinning medicine, take it exactly as told. Taking too much of it can cause bleeding. If you do not take enough of it, you will not have the protection that you need against stroke and other problems.  Do not use any tobacco products. These include cigarettes, chewing tobacco, and e-cigarettes. If you need help quitting, ask your doctor.  If you have apnea (obstructive sleep apnea), manage it as told by your doctor.  Do not drink alcohol.  Do not drink beverages that have caffeine. These include coffee, soda, and tea.  Maintain a healthy weight. Do not use diet pills unless your doctor says they are safe for you. Diet pills may make heart problems worse.  Follow diet instructions as told by your doctor.  Exercise regularly as told by your doctor.  Keep all follow-up visits as told by your doctor. This is important. Contact a doctor if:  You notice a change in the speed, rhythm, or strength of your heartbeat.  You are taking a blood-thinning medicine and you notice more bruising.  You get tired more easily when you move or exercise. Get help right away if:  You have pain in your chest or your belly (abdomen).  You have sweating or weakness.  You feel sick to your stomach (nauseous).  You notice blood in your throw up (vomit), poop (stool), or pee (urine).  You are short of  breath.  You suddenly have swollen feet and ankles.  You feel dizzy.  Your suddenly get weak or numb in your face, arms, or legs, especially if it happens on one side of your body.  You have trouble talking, trouble understanding, or both.  Your face or your eyelid droops on one side. These symptoms may be an emergency. Do not wait to see if the symptoms will go away. Get medical help right away. Call your local emergency services (911 in the [place] Do not drive yourself to the hospital. This information is not intended to replace advice given to you by your health care provider. Make sure you discuss any questions you have with your health care provider. Document Released: 03/05/2008 Document Revised: 11/02/2015 Document Reviewed: 09/21/2014 Elsevier Interactive Patient Education  2018 [employer]. 1)Followup in afib/atrial fibrillation clinic as scheduled  2)Take Metoprolol/Lopressor 12.5mg  twice a day   3)Take Cardizem CD 120mg  daily   4) take Eliquis 5mg  BID  5)Avoid ibuprofen/Advil/Aleve/Motrin/Goody Powders/Naproxen/BC powders/Meloxicam/Diclofenac/Indomethacin and other Nonsteroidal anti-inflammatory medications as these will make you more likely to bleed and can cause stomach ulcers, can also cause Kidney problems.   6)Very low-salt diet advised  7) follow-up with Dr. Silverio Decamp from gastroenterology service in 4 to 6 weeks--- you cannot have your colonoscopy yet as you have to be on Eliquis for at least another 4 weeks after you cardioversion    Electrical Cardioversion, Care After This sheet gives you information about how to care for yourself after your procedure. Your  health care provider may also give you more specific instructions. If you have problems or questions, contact your health care provider. What can I expect after the procedure? After the procedure, it is common to have:  Some redness on the skin where the shocks were given.  Follow these instructions at  home:  Do not drive for 24 hours if you were given a medicine to help you relax (sedative).  Take over-the-counter and prescription medicines only as told by your health care provider.  Ask your health care provider how to check your pulse. Check it often.  Rest for 48 hours after the procedure or as told by your health care provider.  Avoid or limit your caffeine use as told by your health care provider. Contact a health care provider if:  You feel like your heart is beating too quickly or your pulse is not regular.  You have a serious muscle cramp that does not go away. Get help right away if:  You have discomfort in your chest.  You are dizzy or you feel faint.  You have trouble breathing or you are short of breath.  Your speech is slurred.  You have trouble moving an arm or leg on one side of your body.  Your fingers or toes turn cold or blue. This information is not intended to replace advice given to you by your health care provider. Make sure you discuss any questions you have with your health care provider. Document Released: 03/17/2013 Document Revised: 12/29/2015 Document Reviewed: 12/01/2015 Elsevier Interactive Patient Education  2018 [place] on my medicine - ELIQUIS (apixaban)  Why was Eliquis prescribed for you? Eliquis was prescribed for you to reduce the risk of a blood clot forming that can cause a stroke if you have a medical condition called atrial fibrillation (a type of irregular heartbeat).  What do You need to know about Eliquis ? Take your Eliquis TWICE DAILY - one tablet in the morning and one tablet in the evening with or without food. If you have difficulty swallowing the tablet whole please discuss with your pharmacist how to take the medication safely.  Take Eliquis exactly as prescribed by your doctor and DO NOT stop taking Eliquis without talking to the doctor who prescribed the  medication.  Stopping may increase your risk of developing a stroke.  Refill your prescription before you run out.  After discharge, you should have regular check-up appointments with your healthcare provider that is prescribing your Eliquis.  In the future your dose may need to be changed if your kidney function or weight changes by a significant amount or as you get older.  What do you do if you miss a dose? If you miss a dose, take it as soon as you remember on the same day and resume taking twice daily.  Do not take more than one dose of ELIQUIS at the same time to make up a missed dose.  Important Safety Information A possible side effect of Eliquis is bleeding. You should call your healthcare provider right away if you experience any of the following: ? Bleeding from an injury or your nose that does not stop. ? Unusual colored urine (red or dark brown) or unusual colored stools (red or black). ? Unusual bruising for unknown reasons. ? A serious fall or if you hit your head (even if there is no bleeding).  Some  medicines may interact with Eliquis and might increase your risk of bleeding or clotting while on Eliquis. To help avoid this, consult your healthcare provider or pharmacist prior to using any new prescription or non-prescription medications, including herbals, vitamins, non-steroidal anti-inflammatory drugs (NSAIDs) and supplements.  This website has more information on Eliquis (apixaban): http://www.eliquis.com/eliquis/home   1)Followup in afib/atrial fibrillation clinic as scheduled  2)Take Metoprolol/Lopressor 12.5mg  twice a day   3)Take Cardizem CD 120mg  daily   4) take Eliquis 5mg  BID  5)Avoid ibuprofen/Advil/Aleve/Motrin/Goody Powders/Naproxen/BC powders/Meloxicam/Diclofenac/Indomethacin and other Nonsteroidal anti-inflammatory medications as these will make you more likely to bleed and can cause stomach ulcers, can also cause Kidney problems.   6)Very low-salt  diet advised  7) follow-up with Dr. Silverio Decamp from gastroenterology service in 4 to 6 weeks--- you cannot have your colonoscopy yet as you have to be on Eliquis for at least another 4 weeks after you cardioversion

## 2017-11-07 NOTE — Care Management Obs Status (Addendum)
[facility] NOTIFICATION   Patient Details  Name: Kelly Walker MRN: [id number] Date of Birth: 06/25/1945   Medicare Observation Status Notification Given:  Yes, pt requested CM document initials in [place], RN 11/07/2017, 4:36 PM

## 2017-11-07 NOTE — Telephone Encounter (Signed)
Okay 

## 2017-11-09 ENCOUNTER — Encounter (HOSPITAL_COMMUNITY): Payer: Self-pay | Admitting: Cardiology

## 2017-11-11 ENCOUNTER — Encounter (HOSPITAL_COMMUNITY): Payer: Self-pay | Admitting: Nurse Practitioner

## 2017-11-11 ENCOUNTER — Ambulatory Visit (HOSPITAL_COMMUNITY)
Admission: RE | Admit: 2017-11-11 | Discharge: 2017-11-11 | Disposition: A | Discharge status: Home / Self Care | Payer: Medicare Other | Source: Ambulatory Visit | Attending: Nurse Practitioner | Admitting: Nurse Practitioner

## 2017-11-11 VITALS — BP 132/74 | HR 73 | Ht 65.0 in | Wt 200.4 lb

## 2017-11-11 DIAGNOSIS — Z888 Allergy status to other drugs, medicaments and biological substances status: Secondary | ICD-10-CM | POA: Insufficient documentation

## 2017-11-11 DIAGNOSIS — I480 Paroxysmal atrial fibrillation: Secondary | ICD-10-CM | POA: Diagnosis not present

## 2017-11-11 DIAGNOSIS — Z8249 Family history of ischemic heart disease and other diseases of the circulatory system: Secondary | ICD-10-CM | POA: Diagnosis not present

## 2017-11-11 DIAGNOSIS — Z833 Family history of diabetes mellitus: Secondary | ICD-10-CM | POA: Diagnosis not present

## 2017-11-11 DIAGNOSIS — Z79899 Other long term (current) drug therapy: Secondary | ICD-10-CM | POA: Diagnosis not present

## 2017-11-11 DIAGNOSIS — I4891 Unspecified atrial fibrillation: Secondary | ICD-10-CM | POA: Insufficient documentation

## 2017-11-11 DIAGNOSIS — Z881 Allergy status to other antibiotic agents status: Secondary | ICD-10-CM | POA: Insufficient documentation

## 2017-11-11 DIAGNOSIS — Z87891 Personal history of nicotine dependence: Secondary | ICD-10-CM | POA: Diagnosis not present

## 2017-11-11 DIAGNOSIS — Z882 Allergy status to sulfonamides status: Secondary | ICD-10-CM | POA: Diagnosis not present

## 2017-11-11 DIAGNOSIS — Z800 Family history of malignant neoplasm of digestive organs: Secondary | ICD-10-CM | POA: Insufficient documentation

## 2017-11-11 MED ORDER — METOPROLOL TARTRATE 25 MG PO TABS: 12.5000 mg | ORAL_TABLET | Freq: Two times a day (BID) | ORAL | 2 refills | Status: AC

## 2017-11-11 MED ORDER — APIXABAN 5 MG PO TABS: 5.0000 mg | ORAL_TABLET | Freq: Two times a day (BID) | ORAL | 2 refills | Status: DC

## 2017-11-11 MED ORDER — DILTIAZEM HCL ER COATED BEADS 120 MG PO CP24: 120.0000 mg | ORAL_CAPSULE | Freq: Every day | ORAL | 2 refills | Status: DC

## 2017-11-11 NOTE — Progress Notes (Signed)
Primary Care Physician: Jonathon Jordan, MD Referring Physician: [facility] ER f/u   Kelly Walker is a 72 y.o. female without a h/o significant PMH, that is in the afib clinic for f/u of 2 recent ER visits. The first one was for SVT, 4/19, afib was not documented. She had seen her PCP the day before and described heart racing and was started on metoprolol 25 mg bid. She returned to the  ER , 5/1, and afib was documented. She was started on apixaban 5 mg bid. In the afib clinic for f/u, she is in [place]. Metoprolol dose was increased in the ER as well.  We discussed triggers, she does not drink significant alcohol or caffeine. No tobacco. She tries to walk for exercise.She does not think she has any snoring or apnea.  She has not noted any further afib.  F/u in afib clinic, 5/30. Pt had afib with RVR and returned to the ER and had successful  cardioversion. Cardizem was added to her metoprolol. She continues on eliquis 5 mg bid. She is planning to go to [place] for 6 months in the next few weeks.  Today, she denies symptoms of palpitations, chest pain, shortness of breath, orthopnea, PND, lower extremity edema, dizziness, presyncope, syncope, or neurologic sequela. The patient is tolerating medications without difficulties and is otherwise without complaint today.   Past Medical History:  Diagnosis Date  . Atrial fibrillation ([place])    on Eliquis  . Diverticulosis   . Gout   . Grade I diastolic dysfunction   . Hyperlipidemia   . Menopause   . SBO (small bowel obstruction) ([place]) 11/06/2012   Past Surgical History:  Procedure Laterality Date  . ABDOMINAL HYSTERECTOMY  2001  . APPENDECTOMY  2001  . CARDIOVERSION N/A 11/07/2017   Procedure: CARDIOVERSION;  Surgeon: Larey Dresser, MD;  Location: [facility] ENDOSCOPY;  Service: Cardiovascular;  Laterality: N/A;  . CATARACT EXTRACTION Bilateral   . TONSILLECTOMY  1957  . UMBILICAL HERNIA REPAIR  2001    Current Outpatient Medications  Medication Sig  Dispense Refill  . apixaban (ELIQUIS) 5 MG TABS tablet Take 1 tablet (5 mg total) by mouth 2 (two) times daily. 360 tablet 2  . diltiazem (CARDIZEM CD) 120 MG 24 hr capsule Take 1 capsule (120 mg total) by mouth daily. 180 capsule 2  . metoprolol tartrate (LOPRESSOR) 25 MG tablet Take 0.5 tablets (12.5 mg total) by mouth 2 (two) times daily. 180 tablet 2  . Probiotic Product (PROBIOTIC DAILY PO) Take 1 capsule by mouth daily.      No current facility-administered medications for this encounter.     Allergies  Allergen Reactions  . Lecithin Other (See Comments)    Intestinal distress  . Other     Fluoroquinolones Antibiotics=increased heartbeat,fatigue,lack of focus   . Potassium Nitrate Other (See Comments)    & Potassium Nitrite=Increased heart rate,disorientation   . Sulfa Antibiotics     Joint pain  . Sulfasalazine Other (See Comments)    Joint pain  . Ciprofloxacin Anxiety  . Flagyl [Metronidazole] Anxiety    Social History   Socioeconomic History  . Marital status: Married    Spouse name: Not on file  . Number of children: Not on file  . Years of education: Not on file  . Highest education level: Not on file  Occupational History  . Not on file  Social Needs  . Financial resource strain: Not on file  . Food insecurity:    Worry: Not  on file    Inability: Not on file  . Transportation needs:    Medical: Not on file    Non-medical: Not on file  Tobacco Use  . Smoking status: Former Smoker    Packs/day: 1.00    Years: 4.00    Pack years: 4.00    Last attempt to quit: 06/10/1972    Years since quitting: 45.4  . Smokeless tobacco: Never Used  Substance and Sexual Activity  . Alcohol use: Yes    Comment: occasional-social  . Drug use: No  . Sexual activity: Yes  Lifestyle  . Physical activity:    Days per week: Not on file    Minutes per session: Not on file  . Stress: Not on file  Relationships  . Social connections:    Talks on phone: Not on file    Gets  together: Not on file    Attends religious service: Not on file    Active member of club or organization: Not on file    Attends meetings of clubs or organizations: Not on file    Relationship status: Not on file  . Intimate partner violence:    Fear of current or ex partner: Not on file    Emotionally abused: Not on file    Physically abused: Not on file    Forced sexual activity: Not on file  Other Topics Concern  . Not on file  Social History Narrative  . Not on file    Family History  Problem Relation Age of Onset  . Heart failure Mother   . Diabetes Mother   . Heart attack Father   . Colon cancer Father 61  . Colon cancer Paternal Uncle 64  . Stomach cancer Cousin     ROS- All systems are reviewed and negative except as per the HPI above  Physical Exam: Vitals:   11/11/17 1444  BP: 132/74  Pulse: 73  Weight: 200 lb 6.4 oz (90.9 kg)  Height: 5\' 5"  (1.651 m)   Wt Readings from Last 3 Encounters:  11/11/17 200 lb 6.4 oz (90.9 kg)  11/07/17 195 lb (88.5 kg)  10/28/17 204 lb (92.5 kg)    Labs: Lab Results  Component Value Date   NA 141 11/07/2017   K 4.1 11/07/2017   CL 106 11/07/2017   CO2 28 11/07/2017   GLUCOSE 110 (H) 11/07/2017   BUN 6 11/07/2017   CREATININE 0.86 11/07/2017   CALCIUM 9.2 11/07/2017   No results found for: INR Lab Results  Component Value Date   CHOL 168 11/07/2017   HDL 48 11/07/2017   LDLCALC 105 (H) 11/07/2017   TRIG 74 11/07/2017     GEN- The patient is well appearing, alert and oriented x 3 today.   Head- normocephalic, atraumatic Eyes-  Sclera clear, conjunctiva pink Ears- hearing intact Oropharynx- clear Neck- supple, no JVP Lymph- no cervical lymphadenopathy Lungs- Clear to ausculation bilaterally, normal work of breathing Heart- Regular rate and rhythm, no murmurs, rubs or gallops, PMI not laterally displaced GI- soft, NT, ND, + BS Extremities- no clubbing, cyanosis, or edema MS- no significant deformity or  atrophy Skin- no rash or lesion Psych- euthymic mood, full affect Neuro- strength and sensation are intact  EKG-NSR at 73 bpm,pr int 158 ms, qrs int 78 ms, qtc 407 ms Epic records reviewed    Assessment and Plan: 1. New onset afib General info re afib, triggers and how to manage it reviewed Discussed use of antiarrythmic's, but pt  does not wnt to go there yet, especially since she is planning to go to [place] for 6 months and wants to see how Cardizem 120 mg daily with BB will work to suppress afib Continue metoprolol tartrate at 75 mg am and 50 mg pm   2. Chadsvasc score of 2 Continue eliquis 5 mg bid Bleeding precautions discussed   F/u with Dr. Gwenlyn Found as scheduled afib clinic as needed   Butch Penny C. Samin Milke, [facility] [address] [place], [place] [address] [phone]

## 2018-10-12 ENCOUNTER — Telehealth: Payer: Self-pay | Admitting: *Deleted

## 2018-10-12 NOTE — Telephone Encounter (Signed)
Kelly Walker, refused appointment at this time.

## 2019-01-08 ENCOUNTER — Other Ambulatory Visit (HOSPITAL_COMMUNITY): Payer: Self-pay | Admitting: Nurse Practitioner

## 2019-01-09 ENCOUNTER — Other Ambulatory Visit (HOSPITAL_COMMUNITY): Payer: Self-pay | Admitting: Nurse Practitioner

## 2019-01-11 NOTE — Telephone Encounter (Signed)
46f 90.9kg Scr 0.86 (11/08/27) OUTDATED Lovw/donna carroll 11/11/17 will refill one month with a note to pharmacy stating a cardiology appt is needed and labs

## 2019-02-08 ENCOUNTER — Other Ambulatory Visit (HOSPITAL_COMMUNITY): Payer: Self-pay | Admitting: Nurse Practitioner

## 2019-08-30 ENCOUNTER — Other Ambulatory Visit: Payer: Self-pay | Admitting: Family Medicine

## 2019-08-30 DIAGNOSIS — Z1231 Encounter for screening mammogram for malignant neoplasm of breast: Secondary | ICD-10-CM

## 2019-09-22 ENCOUNTER — Ambulatory Visit
Admission: RE | Admit: 2019-09-22 | Discharge: 2019-09-22 | Disposition: A | Discharge status: Home / Self Care | Payer: Medicare PPO | Source: Ambulatory Visit | Attending: Family Medicine | Admitting: Family Medicine

## 2019-09-22 ENCOUNTER — Other Ambulatory Visit: Payer: Self-pay

## 2019-09-22 DIAGNOSIS — Z1231 Encounter for screening mammogram for malignant neoplasm of breast: Secondary | ICD-10-CM

## 2020-01-12 ENCOUNTER — Other Ambulatory Visit: Payer: Self-pay | Admitting: Family Medicine

## 2020-01-12 ENCOUNTER — Ambulatory Visit
Admission: RE | Admit: 2020-01-12 | Discharge: 2020-01-12 | Disposition: A | Discharge status: Home / Self Care | Payer: Medicare PPO | Source: Ambulatory Visit | Attending: Family Medicine | Admitting: Family Medicine

## 2020-01-12 DIAGNOSIS — S7012XA Contusion of left thigh, initial encounter: Secondary | ICD-10-CM | POA: Diagnosis not present

## 2020-01-12 DIAGNOSIS — W19XXXA Unspecified fall, initial encounter: Secondary | ICD-10-CM | POA: Diagnosis not present

## 2020-01-12 DIAGNOSIS — S4992XA Unspecified injury of left shoulder and upper arm, initial encounter: Secondary | ICD-10-CM | POA: Diagnosis not present

## 2020-01-12 DIAGNOSIS — M25512 Pain in left shoulder: Secondary | ICD-10-CM

## 2020-01-12 DIAGNOSIS — R0781 Pleurodynia: Secondary | ICD-10-CM | POA: Diagnosis not present

## 2020-01-12 DIAGNOSIS — M19012 Primary osteoarthritis, left shoulder: Secondary | ICD-10-CM | POA: Diagnosis not present

## 2020-01-12 DIAGNOSIS — I700 Atherosclerosis of aorta: Secondary | ICD-10-CM | POA: Diagnosis not present

## 2020-01-12 DIAGNOSIS — S299XXA Unspecified injury of thorax, initial encounter: Secondary | ICD-10-CM | POA: Diagnosis not present

## 2020-01-18 ENCOUNTER — Other Ambulatory Visit (HOSPITAL_COMMUNITY): Payer: Self-pay | Admitting: Nurse Practitioner

## 2020-01-18 ENCOUNTER — Other Ambulatory Visit: Payer: Self-pay

## 2020-01-18 ENCOUNTER — Ambulatory Visit: Payer: Medicare PPO | Attending: Family Medicine | Admitting: Physical Therapy

## 2020-01-18 ENCOUNTER — Encounter: Payer: Self-pay | Admitting: Physical Therapy

## 2020-01-18 DIAGNOSIS — M25512 Pain in left shoulder: Secondary | ICD-10-CM | POA: Diagnosis not present

## 2020-01-18 DIAGNOSIS — M25612 Stiffness of left shoulder, not elsewhere classified: Secondary | ICD-10-CM

## 2020-01-18 DIAGNOSIS — M25552 Pain in left hip: Secondary | ICD-10-CM

## 2020-01-18 DIAGNOSIS — M6281 Muscle weakness (generalized): Secondary | ICD-10-CM | POA: Diagnosis not present

## 2020-01-18 NOTE — Therapy (Signed)
[facility] [address], [name], [place], [address] Phone: [phone]   Fax:  [phone]  Physical Therapy Evaluation  Patient Details  Name: Kelly Walker MRN: [id number] Date of Birth: September 08, 1945 Referring Provider (PT): Jonathon Jordan, MD   Encounter Date: 01/18/2020   PT End of Session - 01/18/20 1220    Visit Number 1    Date for PT Re-Evaluation 04/11/20    PT Start Time 1146    PT Stop Time 1226    PT Time Calculation (min) 40 min    Activity Tolerance Patient tolerated treatment well    Behavior During Therapy [facility] for tasks assessed/performed           Past Medical History:  Diagnosis Date  . Atrial fibrillation ([place])    on Eliquis  . Diverticulosis   . Gout   . Grade I diastolic dysfunction   . Hyperlipidemia   . Menopause   . SBO (small bowel obstruction) ([place]) 11/06/2012    Past Surgical History:  Procedure Laterality Date  . ABDOMINAL HYSTERECTOMY  2001  . APPENDECTOMY  2001  . CARDIOVERSION N/A 11/07/2017   Procedure: CARDIOVERSION;  Surgeon: Larey Dresser, MD;  Location: [facility] ENDOSCOPY;  Service: Cardiovascular;  Laterality: N/A;  . CATARACT EXTRACTION Bilateral   . TONSILLECTOMY  1957  . UMBILICAL HERNIA REPAIR  2001    There were no vitals filed for this visit.    Subjective Assessment - 01/18/20 1147    Subjective Pt fell on 01/05/20 and has since had Lt shoulder pain ever since.  No fractures.  She also has a large hematoma on the Lt lateral hip.  Pt states it is TTP but not excessively painful.    Patient Stated Goals Lt shoulder improve ROM    Currently in Pain? Yes    Pain Score 4     Pain Location Shoulder    Pain Orientation Left    Pain Descriptors / Indicators Tightness    Pain Type Acute pain    Pain Radiating Towards feels down arm a little when lifting but mostly in the shoulder    Pain Onset 1 to 4 weeks ago    Pain Frequency Intermittent    Aggravating Factors   lifting the arm    Pain Relieving Factors moist heat in the shower, heating pad    Multiple Pain Sites No              [facility] PT Assessment - 01/18/20 0001      Assessment   Medical Diagnosis M19.90 (ICD-10-CM) - Osteoarthritis    Referring Provider (PT) Jonathon Jordan, MD    Onset Date/Surgical Date 01/05/20    Prior Therapy no      Precautions   Precautions None      Restrictions   Weight Bearing Restrictions No      Balance Screen   Has the patient fallen in the past 6 months Yes    How many times? 1 - hiking and didn't step over the roots      [place] residence    Living Arrangements Spouse/significant other      Prior Function   Level of [place] Retired      Associate Professor   Overall Cognitive Status Within Functional Limits for tasks assessed      Observation/Other Assessments   Observations Lt hip has softball diameter hard mass around greater trochanter, 29 cm  long and 20 cm wide bruise    Focus on Therapeutic Outcomes (FOTO)  41% limited      Posture/Postural Control   Posture/Postural Control Postural limitations    Postural Limitations Forward head;Rounded Shoulders      ROM / Strength   AROM / PROM / Strength AROM;PROM;Strength      AROM   AROM Assessment Site Shoulder    Right/Left Shoulder Right;Left    Left Shoulder Flexion 80 Degrees    Left Shoulder ABduction 78 Degrees    Left Shoulder Internal Rotation --   reaches to T11     PROM   Overall PROM Comments hip WFL bil - pain with ER that is similar bilat due to arthritis    PROM Assessment Site Shoulder;Hip    Right/Left Shoulder Right;Left    Left Shoulder Flexion 110 Degrees      Strength   Strength Assessment Site Shoulder    Right/Left Shoulder Right;Left    Right Shoulder Flexion 4/5    Right Shoulder Extension 5/5    Right Shoulder ABduction 4+/5    Right Shoulder Internal Rotation 5/5    Right Shoulder External  Rotation 5/5    Left Shoulder Flexion 3/5   +pain   Left Shoulder Extension 5/5    Left Shoulder ABduction 4/5    Left Shoulder Internal Rotation 5/5    Left Shoulder External Rotation 4-/5      Palpation   Palpation comment anterior deltoid tight that loosened with distraction                      Objective measurements completed on examination: See above findings.       [facility] Adult PT Treatment/Exercise - 01/18/20 0001      Self-Care   Self-Care Other Self-Care Comments    Other Self-Care Comments  educated and performed intial HEP                  PT Education - 01/18/20 1220    Education Details Access Code: [email or web address]    Person(s) Educated Patient    Methods Explanation;Demonstration;Handout;Verbal cues;Tactile cues    Comprehension Verbalized understanding;Returned demonstration            PT Short Term Goals - 01/18/20 1436      PT SHORT TERM GOAL #1   Title reduced pain by 25%    Time 6    Period Weeks    Status New    Target Date 02/29/20             PT Long Term Goals - 01/18/20 1413      PT LONG TERM GOAL #1   Title Pt will be ind with advanced HEP    Time 12    Period Weeks    Status New    Target Date 04/11/20      PT LONG TERM GOAL #2   Title Pt will have shoulder flexion of at least 140 deg for overhead reaching    Time 12    Period Weeks    Status New    Target Date 04/11/20      PT LONG TERM GOAL #3   Title Pt will report at least 50% less pain with overhead reaching    Time 12    Period Weeks    Status New    Target Date 04/11/20      PT LONG TERM GOAL #4   Title Pt will be able to  reach behind her head without increased pain for self care such as washing her hair    Baseline unable +pain    Time 12    Period Weeks    Status New    Target Date 04/11/20      PT LONG TERM GOAL #5   Title FOTO < or = to 29% limted    Time 12    Period Weeks    Status New    Target Date 04/11/20                   Plan - 01/18/20 1358    Clinical Impression Statement Pt presents to clinic due to recent fall over some roots when hiking.  She has a large hematoma as desribed above with pain with pressure on the Lt hip. Pt is mostly limited by the Lt shoulder pain.  She is left handed and has ROM limited, weakness and pain.  Pt has tension in anterior deltoids which loosened with distraction mobilization.  Pt will benefit from skilled PT to work on mobility and strength as well as address any impairments for return to maximum function.    Personal Factors and Comorbidities Age;Comorbidity 1    Comorbidities OA throughout    Examination-Activity Limitations Carry;Lift;Dressing    Examination-Participation Restrictions Cleaning    Stability/Clinical Decision Making Stable/Uncomplicated    Clinical Decision Making Low    Rehab Potential Excellent    PT Frequency 2x / week    PT Duration 12 weeks   several weeks off midway due to travel   PT Treatment/Interventions ADLs/Self Care Home Management;Cryotherapy;Electrical Stimulation;Iontophoresis 4mg /ml Dexamethasone;Moist Heat;Ultrasound;Therapeutic activities;Stair training;Therapeutic exercise;Neuromuscular re-education;Patient/family education;Manual techniques;Dry needling;Scar mobilization;Passive range of motion;Taping    PT Next Visit Plan Lt shoulder AAROM; PROM; add to HEP; isometric strength flex, abd, ER    PT Home Exercise Plan Access Code: [email or web address]    Consulted and Agree with Plan of Care Patient           Patient will benefit from skilled therapeutic intervention in order to improve the following deficits and impairments:  Pain, Increased muscle spasms, Hypomobility, Decreased strength, Decreased range of motion, Impaired UE functional use  Visit Diagnosis: Acute pain of left shoulder - Plan: PT plan of care cert/re-cert  Pain in left hip - Plan: PT plan of care cert/re-cert  Stiffness of left shoulder, not elsewhere  classified - Plan: PT plan of care cert/re-cert  Muscle weakness (generalized) - Plan: PT plan of care cert/re-cert     Problem List Patient Active Problem List   Diagnosis Date Noted  . Atrial fibrillation with RVR ([place]) 11/06/2017  . HLD (hyperlipidemia) 11/06/2017  . Paroxysmal atrial fibrillation ([place]) 10/23/2017  . Pain in finger of left hand 09/20/2016  . Trigger finger, left ring finger 09/20/2016  . DJD (degenerative joint disease), multiple sites 03/20/2014  . History of bone density study 04/19/2013  . Estrogen deficiency 02/18/2013  . Elevated uric acid in blood 02/18/2013  . Personal history of colonic polyps 02/18/2013  . SBO (small bowel obstruction) ([place]) 11/06/2012  . DJD (degenerative joint disease) 08/06/2011  . Obesity 08/06/2011  . Family history of colon cancer requiring screening colonoscopy 08/06/2011  . Elevated blood pressure 07/24/2011  . Hyperlipidemia   . Menopause   . Diverticulosis     Jule Ser, PT 01/18/2020, 2:37 PM  [facility] [address] [place], [place], [address] Phone: [phone]   Fax:  [phone]  Name: Skylynne Schlechter MRN: [id number] Date of Birth: 1946/02/23

## 2020-01-18 NOTE — Patient Instructions (Signed)
Access Code: [email or web address] URL: [email or web address] Date: 01/18/2020 Prepared by: Jari Favre  Exercises Seated Shoulder Flexion Towel Slide at Table Top - 1 x daily - 7 x weekly - 3 sets - 10 reps Seated Shoulder External Rotation PROM on Table - 1 x daily - 7 x weekly - 3 sets - 10 reps

## 2020-01-20 ENCOUNTER — Other Ambulatory Visit: Payer: Self-pay

## 2020-01-20 MED ORDER — DILTIAZEM HCL ER COATED BEADS 120 MG PO CP24: ORAL_CAPSULE | ORAL | 0 refills | Status: DC

## 2020-01-20 NOTE — Telephone Encounter (Signed)
Go ahead and fill for 2 months, patient scheduled with Dr. Rockey Situ in September

## 2020-01-20 NOTE — Telephone Encounter (Signed)
Prescription refill sent to pharmacy

## 2020-01-24 ENCOUNTER — Ambulatory Visit: Payer: Medicare PPO

## 2020-01-24 ENCOUNTER — Other Ambulatory Visit: Payer: Self-pay

## 2020-01-24 DIAGNOSIS — M6281 Muscle weakness (generalized): Secondary | ICD-10-CM

## 2020-01-24 DIAGNOSIS — M25612 Stiffness of left shoulder, not elsewhere classified: Secondary | ICD-10-CM | POA: Diagnosis not present

## 2020-01-24 DIAGNOSIS — M25512 Pain in left shoulder: Secondary | ICD-10-CM

## 2020-01-24 DIAGNOSIS — M25552 Pain in left hip: Secondary | ICD-10-CM | POA: Diagnosis not present

## 2020-01-24 NOTE — Patient Instructions (Signed)
Access Code: [email or web address] URL: [email or web address] Date: 01/24/2020 Prepared by: Claiborne Billings  Exercises   Shoulder Flexion Wall Slide with Towel - 2 x daily - 7 x weekly - 1 sets - 10 reps - 5 hold Doorway Pec Stretch at 90 Degrees Abduction - 2 x daily - 7 x weekly - 5 reps - 10 hold Standing Shoulder Flexion to 90 Degrees - 1 x daily - 3 x weekly - 1 sets - 5 reps - 3 sec hold Standing Shoulder Scaption - 1 x daily - 3 x weekly - 1 sets - 5 reps - 3 sec hold Shoulder Abduction - Thumbs Up - 1 x daily - 3 x weekly - 1 sets - 5 reps - 3 sec hold

## 2020-01-24 NOTE — Therapy (Signed)
[facility] [address] [place], [place], [address] Phone: [phone]   Fax:  [phone]  Physical Therapy Treatment  Patient Details  Name: Kelly Walker MRN: [id number] Date of Birth: August 06, 1945 Referring Provider (PT): Jonathon Jordan, MD   Encounter Date: 01/24/2020   PT End of Session - 01/24/20 1143    Visit Number 2    Date for PT Re-Evaluation 04/11/20    PT Start Time 1102    PT Stop Time 1145    PT Time Calculation (min) 43 min    Activity Tolerance Patient tolerated treatment well    Behavior During Therapy [facility] for tasks assessed/performed           Past Medical History:  Diagnosis Date  . Atrial fibrillation ([place])    on Eliquis  . Diverticulosis   . Gout   . Grade I diastolic dysfunction   . Hyperlipidemia   . Menopause   . SBO (small bowel obstruction) ([place]) 11/06/2012    Past Surgical History:  Procedure Laterality Date  . ABDOMINAL HYSTERECTOMY  2001  . APPENDECTOMY  2001  . CARDIOVERSION N/A 11/07/2017   Procedure: CARDIOVERSION;  Surgeon: Larey Dresser, MD;  Location: [facility] ENDOSCOPY;  Service: Cardiovascular;  Laterality: N/A;  . CATARACT EXTRACTION Bilateral   . TONSILLECTOMY  1957  . UMBILICAL HERNIA REPAIR  2001    There were no vitals filed for this visit.   Subjective Assessment - 01/24/20 1103    Subjective I think I have outgrown my exercises.  No pain today.    Currently in Pain? No/denies                             [facility] Adult PT Treatment/Exercise - 01/24/20 0001      Exercises   Exercises Shoulder      Shoulder Exercises: Seated   Flexion Strengthening;Left;5 reps    Abduction Strengthening;Left;5 reps    Diagonals Strengthening;Left;5 reps    Diagonals Limitations tactile cues for scapular depression    Other Seated Exercises UE Ranger x 10 flexion      Shoulder Exercises: Standing   Other Standing Exercises UE Ranger: abduction x  5-fatigue      Shoulder Exercises: Pulleys   Flexion 3 minutes    ABduction 3 minutes      Shoulder Exercises: ROM/Strengthening   UBE (Upper Arm Bike) Level 1x 5  minutes reverse                  PT Education - 01/24/20 1128    Education Details Access Code: [email or web address]    Person(s) Educated Patient    Methods Explanation;Demonstration;Handout    Comprehension Verbalized understanding;Returned demonstration            PT Short Term Goals - 01/18/20 1436      PT SHORT TERM GOAL #1   Title reduced pain by 25%    Time 6    Period Weeks    Status New    Target Date 02/29/20             PT Long Term Goals - 01/18/20 1413      PT LONG TERM GOAL #1   Title Pt will be ind with advanced HEP    Time 12    Period Weeks    Status New    Target Date 04/11/20      PT LONG TERM GOAL #2  Title Pt will have shoulder flexion of at least 140 deg for overhead reaching    Time 12    Period Weeks    Status New    Target Date 04/11/20      PT LONG TERM GOAL #3   Title Pt will report at least 50% less pain with overhead reaching    Time 12    Period Weeks    Status New    Target Date 04/11/20      PT LONG TERM GOAL #4   Title Pt will be able to reach behind her head without increased pain for self care such as washing her hair    Baseline unable +pain    Time 12    Period Weeks    Status New    Target Date 04/11/20      PT LONG TERM GOAL #5   Title FOTO < or = to 29% limted    Time 12    Period Weeks    Status New    Target Date 04/11/20                 Plan - 01/24/20 1133    Clinical Impression Statement Pt with first time follow-up after evaluation.  Pt arrived without pain today.  Pt demonstrated a significant improvement in A/ROM and was ready to progress to new HEP for A/AROM against gravity and gentle strength progression.  Pt required significant tactile and verbal cueing for scapular depression with movement of the Lt UE against gravity and  fatigued significantly with A/ROM for strength.   Pt will continue to benefit from skilled PT to address Lt shoulder pain as needed, strength and flexibility to improve use of Lt UE for functional tasks.    PT Frequency 2x / week    PT Duration 12 weeks    PT Treatment/Interventions ADLs/Self Care Home Management;Cryotherapy;Electrical Stimulation;Iontophoresis 4mg /ml Dexamethasone;Moist Heat;Ultrasound;Therapeutic activities;Stair training;Therapeutic exercise;Neuromuscular re-education;Patient/family education;Manual techniques;Dry needling;Scar mobilization;Passive range of motion;Taping    PT Next Visit Plan review new HEP,  Lt shoulder ROM, strength and endurance as tolerated    PT Home Exercise Plan Access Code: [email or web address]    Recommended Other Services initial cert is signed    Consulted and Agree with Plan of Care Patient           Patient will benefit from skilled therapeutic intervention in order to improve the following deficits and impairments:  Pain, Increased muscle spasms, Hypomobility, Decreased strength, Decreased range of motion, Impaired UE functional use  Visit Diagnosis: Acute pain of left shoulder  Pain in left hip  Stiffness of left shoulder, not elsewhere classified  Muscle weakness (generalized)     Problem List Patient Active Problem List   Diagnosis Date Noted  . Atrial fibrillation with RVR ([place]) 11/06/2017  . HLD (hyperlipidemia) 11/06/2017  . Paroxysmal atrial fibrillation ([place]) 10/23/2017  . Pain in finger of left hand 09/20/2016  . Trigger finger, left ring finger 09/20/2016  . DJD (degenerative joint disease), multiple sites 03/20/2014  . History of bone density study 04/19/2013  . Estrogen deficiency 02/18/2013  . Elevated uric acid in blood 02/18/2013  . Personal history of colonic polyps 02/18/2013  . SBO (small bowel obstruction) ([place]) 11/06/2012  . DJD (degenerative joint disease) 08/06/2011  . Obesity 08/06/2011  . Family history of colon  cancer requiring screening colonoscopy 08/06/2011  . Elevated blood pressure 07/24/2011  . Hyperlipidemia   . Menopause   . Diverticulosis      Claiborne Billings  Garnetta Buddy, PT 01/24/20 11:46 AM  [facility] [address] [place], [place], [address] Phone: [phone]   Fax:  [phone]  Name: Kelly Walker MRN: [id number] Date of Birth: 10/13/1945

## 2020-01-26 DIAGNOSIS — I700 Atherosclerosis of aorta: Secondary | ICD-10-CM | POA: Diagnosis not present

## 2020-01-26 DIAGNOSIS — M1990 Unspecified osteoarthritis, unspecified site: Secondary | ICD-10-CM | POA: Diagnosis not present

## 2020-01-26 DIAGNOSIS — T148XXA Other injury of unspecified body region, initial encounter: Secondary | ICD-10-CM | POA: Diagnosis not present

## 2020-01-31 ENCOUNTER — Ambulatory Visit: Payer: Medicare PPO | Admitting: Physical Therapy

## 2020-01-31 ENCOUNTER — Encounter: Payer: Self-pay | Admitting: Physical Therapy

## 2020-01-31 ENCOUNTER — Other Ambulatory Visit: Payer: Self-pay

## 2020-01-31 DIAGNOSIS — M25512 Pain in left shoulder: Secondary | ICD-10-CM

## 2020-01-31 DIAGNOSIS — M6281 Muscle weakness (generalized): Secondary | ICD-10-CM | POA: Diagnosis not present

## 2020-01-31 DIAGNOSIS — M25612 Stiffness of left shoulder, not elsewhere classified: Secondary | ICD-10-CM

## 2020-01-31 DIAGNOSIS — M25552 Pain in left hip: Secondary | ICD-10-CM | POA: Diagnosis not present

## 2020-01-31 NOTE — Therapy (Addendum)
[facility] [address] [place], [place], [address] Phone: [phone]   Fax:  [phone]  Physical Therapy Treatment  Patient Details  Name: Kelly Walker MRN: [id number] Date of Birth: 03/21/1946 Referring Provider (PT): Jonathon Jordan, MD   Encounter Date: 01/31/2020   PT End of Session - 01/31/20 0842    Visit Number 3    Date for PT Re-Evaluation 04/11/20    PT Start Time 0842    PT Stop Time 0924    PT Time Calculation (min) 42 min    Activity Tolerance Patient tolerated treatment well    Behavior During Therapy [facility] for tasks assessed/performed           Past Medical History:  Diagnosis Date  . Atrial fibrillation ([place])    on Eliquis  . Diverticulosis   . Gout   . Grade I diastolic dysfunction   . Hyperlipidemia   . Menopause   . SBO (small bowel obstruction) ([place]) 11/06/2012    Past Surgical History:  Procedure Laterality Date  . ABDOMINAL HYSTERECTOMY  2001  . APPENDECTOMY  2001  . CARDIOVERSION N/A 11/07/2017   Procedure: CARDIOVERSION;  Surgeon: Larey Dresser, MD;  Location: [facility] ENDOSCOPY;  Service: Cardiovascular;  Laterality: N/A;  . CATARACT EXTRACTION Bilateral   . TONSILLECTOMY  1957  . UMBILICAL HERNIA REPAIR  2001    There were no vitals filed for this visit.   Subjective Assessment - 01/31/20 0844    Subjective A little stiff and sore this AM.    Currently in Pain? Yes    Pain Score 3     Pain Location Shoulder    Pain Orientation Left    Pain Descriptors / Indicators Sore;Tightness    Aggravating Factors  Stiff in the AM, sore in AM and with use    Pain Relieving Factors Heat    Multiple Pain Sites No                             [facility] Adult PT Treatment/Exercise - 01/31/20 0001      Shoulder Exercises: Seated   Flexion Strengthening;Left;Both;10 reps;Limitations    Flexion Limitations VC to keep UT soft    Abduction Strengthening;Left;10 reps     Diagonals Strengthening;Left;10 reps    Other Seated Exercises scap squeeze and depression static contraction 5 sec hold 4x, with PTA giving TC added to HEP      Shoulder Exercises: Standing   Flexion AAROM;Left;10 reps;Other (comment)   Finger ladder   Flexion Limitations 4x Taking hand off the wall contracting scap stabilizers.       Shoulder Exercises: Pulleys   Flexion 3 minutes   TC to correct plane   ABduction 3 minutes   VC to soften neck at top of movement     Shoulder Exercises: ROM/Strengthening   UBE (Upper Arm Bike) Level 1x 5  minutes reverse   Vc to reverse direction each minute                 PT Education - 01/31/20 0911    Education Details Scap squeeze and depresison for HEP    Person(s) Educated Patient    Methods Explanation;Demonstration;Tactile cues;Verbal cues;Handout    Comprehension Returned demonstration;Verbalized understanding            PT Short Term Goals - 01/18/20 1436      PT SHORT TERM GOAL #1   Title reduced  pain by 25%    Time 6    Period Weeks    Status New    Target Date 02/29/20             PT Long Term Goals - 01/18/20 1413      PT LONG TERM GOAL #1   Title Pt will be ind with advanced HEP    Time 12    Period Weeks    Status New    Target Date 04/11/20      PT LONG TERM GOAL #2   Title Pt will have shoulder flexion of at least 140 deg for overhead reaching    Time 12    Period Weeks    Status New    Target Date 04/11/20      PT LONG TERM GOAL #3   Title Pt will report at least 50% less pain with overhead reaching    Time 12    Period Weeks    Status New    Target Date 04/11/20      PT LONG TERM GOAL #4   Title Pt will be able to reach behind her head without increased pain for self care such as washing her hair    Baseline unable +pain    Time 12    Period Weeks    Status New    Target Date 04/11/20      PT LONG TERM GOAL #5   Title FOTO < or = to 29% limted    Time 12    Period Weeks    Status  New    Target Date 04/11/20                 Plan - 01/31/20 0843    Clinical Impression Statement Pt arrives with minimal shoulder soreness. Pt continues to have difficulty with inhibiting her upper traps secondary to her scapular weakness. Pt is compliant with new HEP last session is  working towards  improving her awareness of when she leads with her levators. Pain did not increase during treatrment.    Personal Factors and Comorbidities Age;Comorbidity 1    Comorbidities OA throughout    Examination-Activity Limitations Carry;Lift;Dressing    Examination-Participation Restrictions Cleaning    Stability/Clinical Decision Making Stable/Uncomplicated    Rehab Potential Excellent    PT Frequency 2x / week    PT Duration 12 weeks    PT Treatment/Interventions ADLs/Self Care Home Management;Cryotherapy;Electrical Stimulation;Iontophoresis 47m/ml Dexamethasone;Moist Heat;Ultrasound;Therapeutic activities;Stair training;Therapeutic exercise;Neuromuscular re-education;Patient/family education;Manual techniques;Dry needling;Scar mobilization;Passive range of motion;Taping    PT Next Visit Plan Shoulder and scapula strength and stabilization    PT Home Exercise Plan Access Code: [email or web address]    Consulted and Agree with Plan of Care Patient           Patient will benefit from skilled therapeutic intervention in order to improve the following deficits and impairments:  Pain, Increased muscle spasms, Hypomobility, Decreased strength, Decreased range of motion, Impaired UE functional use  Visit Diagnosis: Pain in left hip  Acute pain of left shoulder  Stiffness of left shoulder, not elsewhere classified  Muscle weakness (generalized)     Problem List Patient Active Problem List   Diagnosis Date Noted  . Atrial fibrillation with RVR ([address] 11/06/2017  . HLD (hyperlipidemia) 11/06/2017  . Paroxysmal atrial fibrillation ([place] 10/23/2017  . Pain in finger of left hand 09/20/2016  .  Trigger finger, left ring finger 09/20/2016  . DJD (degenerative joint disease), multiple sites 03/20/2014  . History of  bone density study 04/19/2013  . Estrogen deficiency 02/18/2013  . Elevated uric acid in blood 02/18/2013  . Personal history of colonic polyps 02/18/2013  . SBO (small bowel obstruction) ([place]) 11/06/2012  . DJD (degenerative joint disease) 08/06/2011  . Obesity 08/06/2011  . Family history of colon cancer requiring screening colonoscopy 08/06/2011  . Elevated blood pressure 07/24/2011  . Hyperlipidemia   . Menopause   . Diverticulosis     Archer Moist, PTA 01/31/2020, 9:24 AM PHYSICAL THERAPY DISCHARGE SUMMARY  Visits from Start of Care: 3  Current functional level related to goals / functional outcomes: See above for current status.  Pt didn't return to PT   Remaining deficits: See above for most current status.     Education / Equipment: HEP Plan: Patient agrees to discharge.  Patient goals were not met. Patient is being discharged due to not returning since the last visit.  ?????         Sigurd Sos, PT 05/08/20 3:43 PM  [facility] [address] [place], [place], [address] Phone: [phone]   Fax:  [phone]  Name: Cayman Brogden MRN: [id number] Date of Birth: 07-27-1945

## 2020-02-09 DIAGNOSIS — Z20828 Contact with and (suspected) exposure to other viral communicable diseases: Secondary | ICD-10-CM | POA: Diagnosis not present

## 2020-02-12 DIAGNOSIS — Z90710 Acquired absence of both cervix and uterus: Secondary | ICD-10-CM | POA: Diagnosis not present

## 2020-02-12 DIAGNOSIS — Z9049 Acquired absence of other specified parts of digestive tract: Secondary | ICD-10-CM | POA: Diagnosis not present

## 2020-02-12 DIAGNOSIS — K56600 Partial intestinal obstruction, unspecified as to cause: Secondary | ICD-10-CM | POA: Diagnosis not present

## 2020-02-12 DIAGNOSIS — Z7901 Long term (current) use of anticoagulants: Secondary | ICD-10-CM | POA: Diagnosis not present

## 2020-02-12 DIAGNOSIS — R112 Nausea with vomiting, unspecified: Secondary | ICD-10-CM | POA: Diagnosis not present

## 2020-02-12 DIAGNOSIS — K56609 Unspecified intestinal obstruction, unspecified as to partial versus complete obstruction: Secondary | ICD-10-CM | POA: Diagnosis not present

## 2020-02-12 DIAGNOSIS — I4891 Unspecified atrial fibrillation: Secondary | ICD-10-CM | POA: Diagnosis not present

## 2020-02-12 DIAGNOSIS — Z20822 Contact with and (suspected) exposure to covid-19: Secondary | ICD-10-CM | POA: Diagnosis not present

## 2020-02-12 DIAGNOSIS — I10 Essential (primary) hypertension: Secondary | ICD-10-CM | POA: Diagnosis not present

## 2020-02-12 DIAGNOSIS — Z01818 Encounter for other preprocedural examination: Secondary | ICD-10-CM | POA: Diagnosis not present

## 2020-02-12 DIAGNOSIS — K828 Other specified diseases of gallbladder: Secondary | ICD-10-CM | POA: Diagnosis not present

## 2020-02-12 DIAGNOSIS — K8020 Calculus of gallbladder without cholecystitis without obstruction: Secondary | ICD-10-CM | POA: Diagnosis not present

## 2020-02-12 DIAGNOSIS — K56699 Other intestinal obstruction unspecified as to partial versus complete obstruction: Secondary | ICD-10-CM | POA: Diagnosis not present

## 2020-02-13 DIAGNOSIS — I4891 Unspecified atrial fibrillation: Secondary | ICD-10-CM | POA: Diagnosis not present

## 2020-02-13 DIAGNOSIS — I10 Essential (primary) hypertension: Secondary | ICD-10-CM | POA: Diagnosis not present

## 2020-02-13 DIAGNOSIS — R109 Unspecified abdominal pain: Secondary | ICD-10-CM | POA: Diagnosis not present

## 2020-02-13 DIAGNOSIS — Z20822 Contact with and (suspected) exposure to covid-19: Secondary | ICD-10-CM | POA: Diagnosis not present

## 2020-02-13 DIAGNOSIS — Z90710 Acquired absence of both cervix and uterus: Secondary | ICD-10-CM | POA: Diagnosis not present

## 2020-02-13 DIAGNOSIS — K56600 Partial intestinal obstruction, unspecified as to cause: Secondary | ICD-10-CM | POA: Diagnosis not present

## 2020-02-13 DIAGNOSIS — K56609 Unspecified intestinal obstruction, unspecified as to partial versus complete obstruction: Secondary | ICD-10-CM | POA: Diagnosis not present

## 2020-02-13 DIAGNOSIS — Z9049 Acquired absence of other specified parts of digestive tract: Secondary | ICD-10-CM | POA: Diagnosis not present

## 2020-02-13 DIAGNOSIS — Z7901 Long term (current) use of anticoagulants: Secondary | ICD-10-CM | POA: Diagnosis not present

## 2020-02-14 DIAGNOSIS — K56609 Unspecified intestinal obstruction, unspecified as to partial versus complete obstruction: Secondary | ICD-10-CM | POA: Diagnosis not present

## 2020-02-14 DIAGNOSIS — Z9049 Acquired absence of other specified parts of digestive tract: Secondary | ICD-10-CM | POA: Diagnosis not present

## 2020-02-14 DIAGNOSIS — I4891 Unspecified atrial fibrillation: Secondary | ICD-10-CM | POA: Diagnosis not present

## 2020-02-14 DIAGNOSIS — Z20822 Contact with and (suspected) exposure to covid-19: Secondary | ICD-10-CM | POA: Diagnosis not present

## 2020-02-14 DIAGNOSIS — K56600 Partial intestinal obstruction, unspecified as to cause: Secondary | ICD-10-CM | POA: Diagnosis not present

## 2020-02-14 DIAGNOSIS — Z7901 Long term (current) use of anticoagulants: Secondary | ICD-10-CM | POA: Diagnosis not present

## 2020-02-14 DIAGNOSIS — I10 Essential (primary) hypertension: Secondary | ICD-10-CM | POA: Diagnosis not present

## 2020-02-14 DIAGNOSIS — Z90710 Acquired absence of both cervix and uterus: Secondary | ICD-10-CM | POA: Diagnosis not present

## 2020-02-16 DIAGNOSIS — Z20828 Contact with and (suspected) exposure to other viral communicable diseases: Secondary | ICD-10-CM | POA: Diagnosis not present

## 2020-03-18 IMAGING — DX DG CHEST 2V
2 series · 2 of 2 positions shown · non-contrast
Comparison: None.

CLINICAL DATA: Shortness of breath and tachycardia.

EXAM:
CHEST - 2 VIEW

[chest lat]
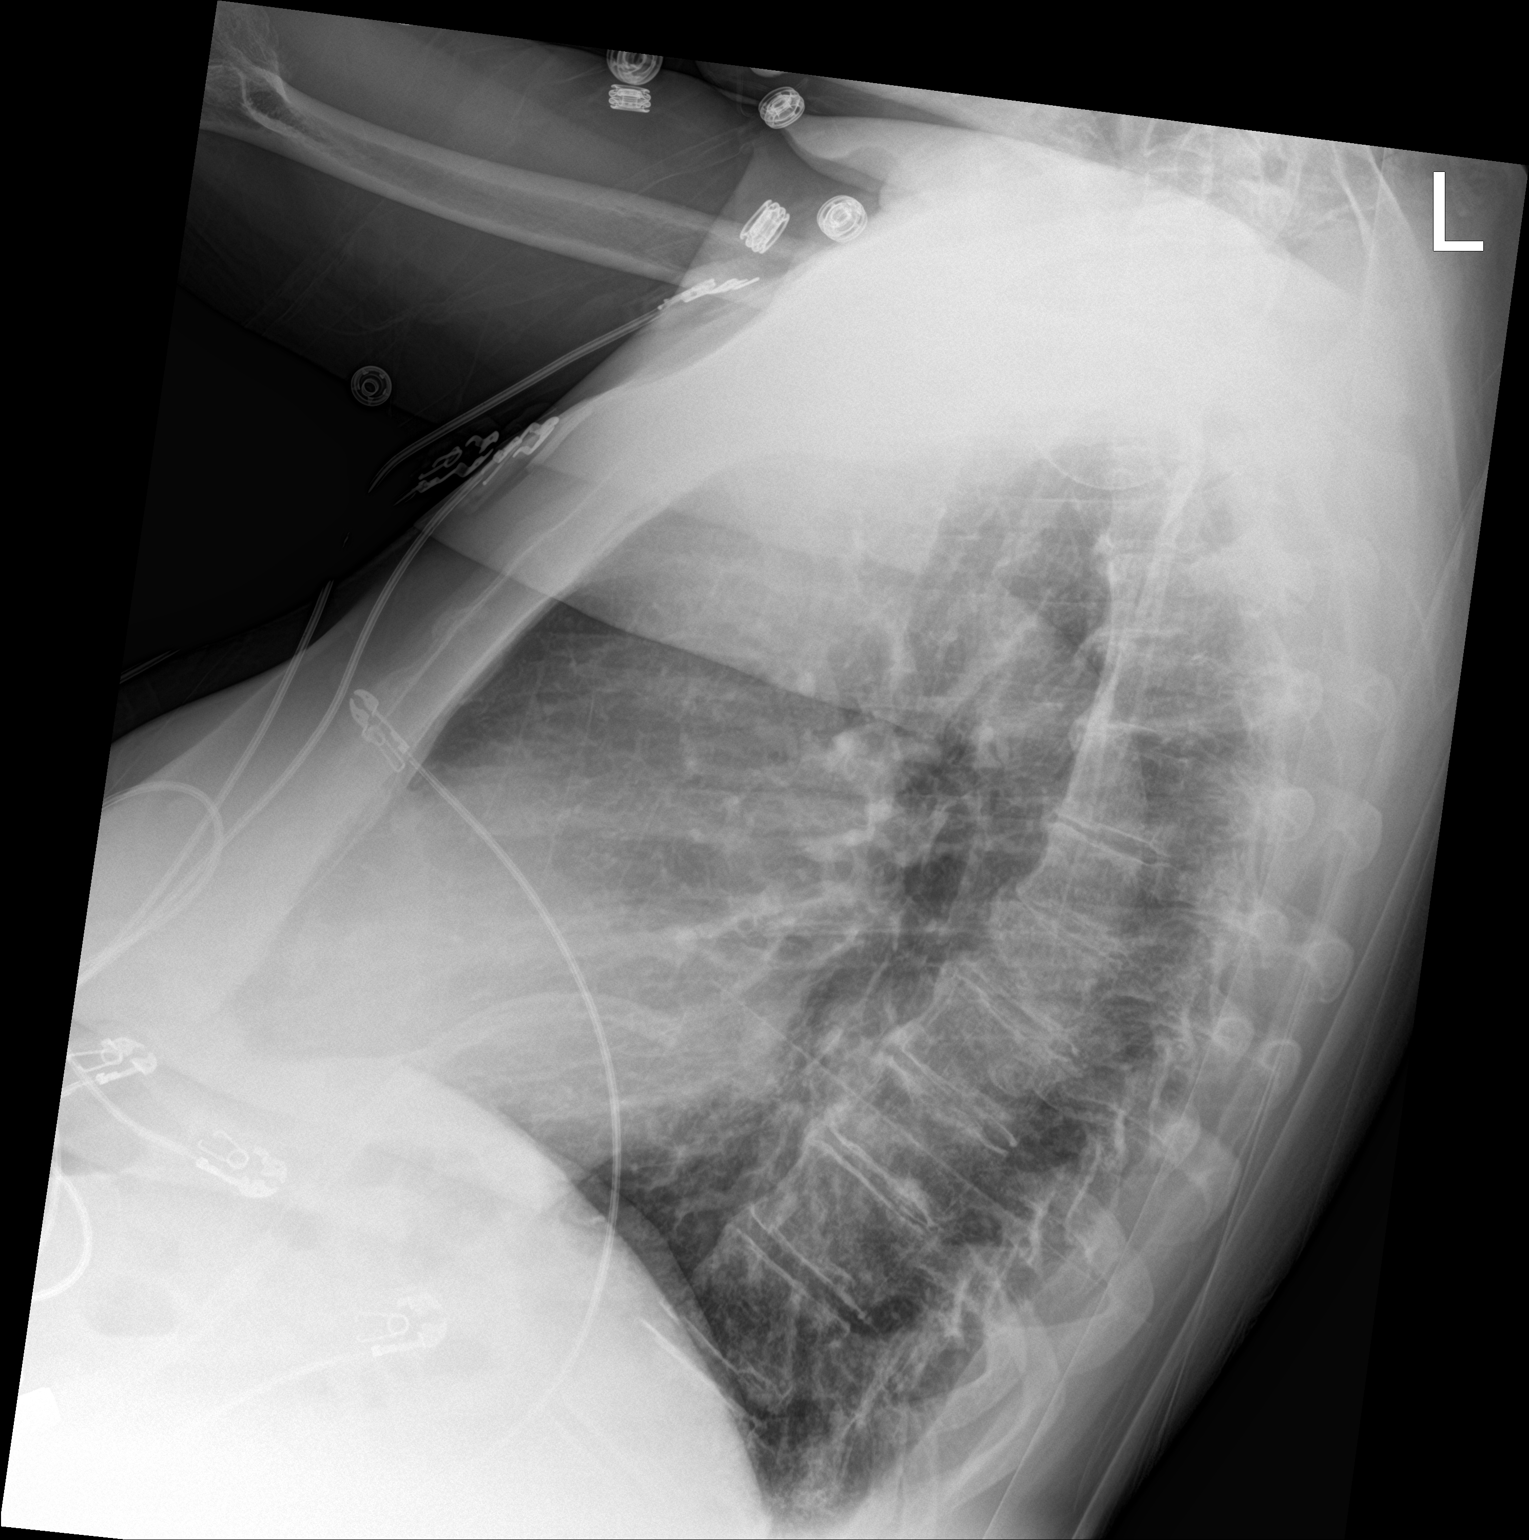

[chest ap]
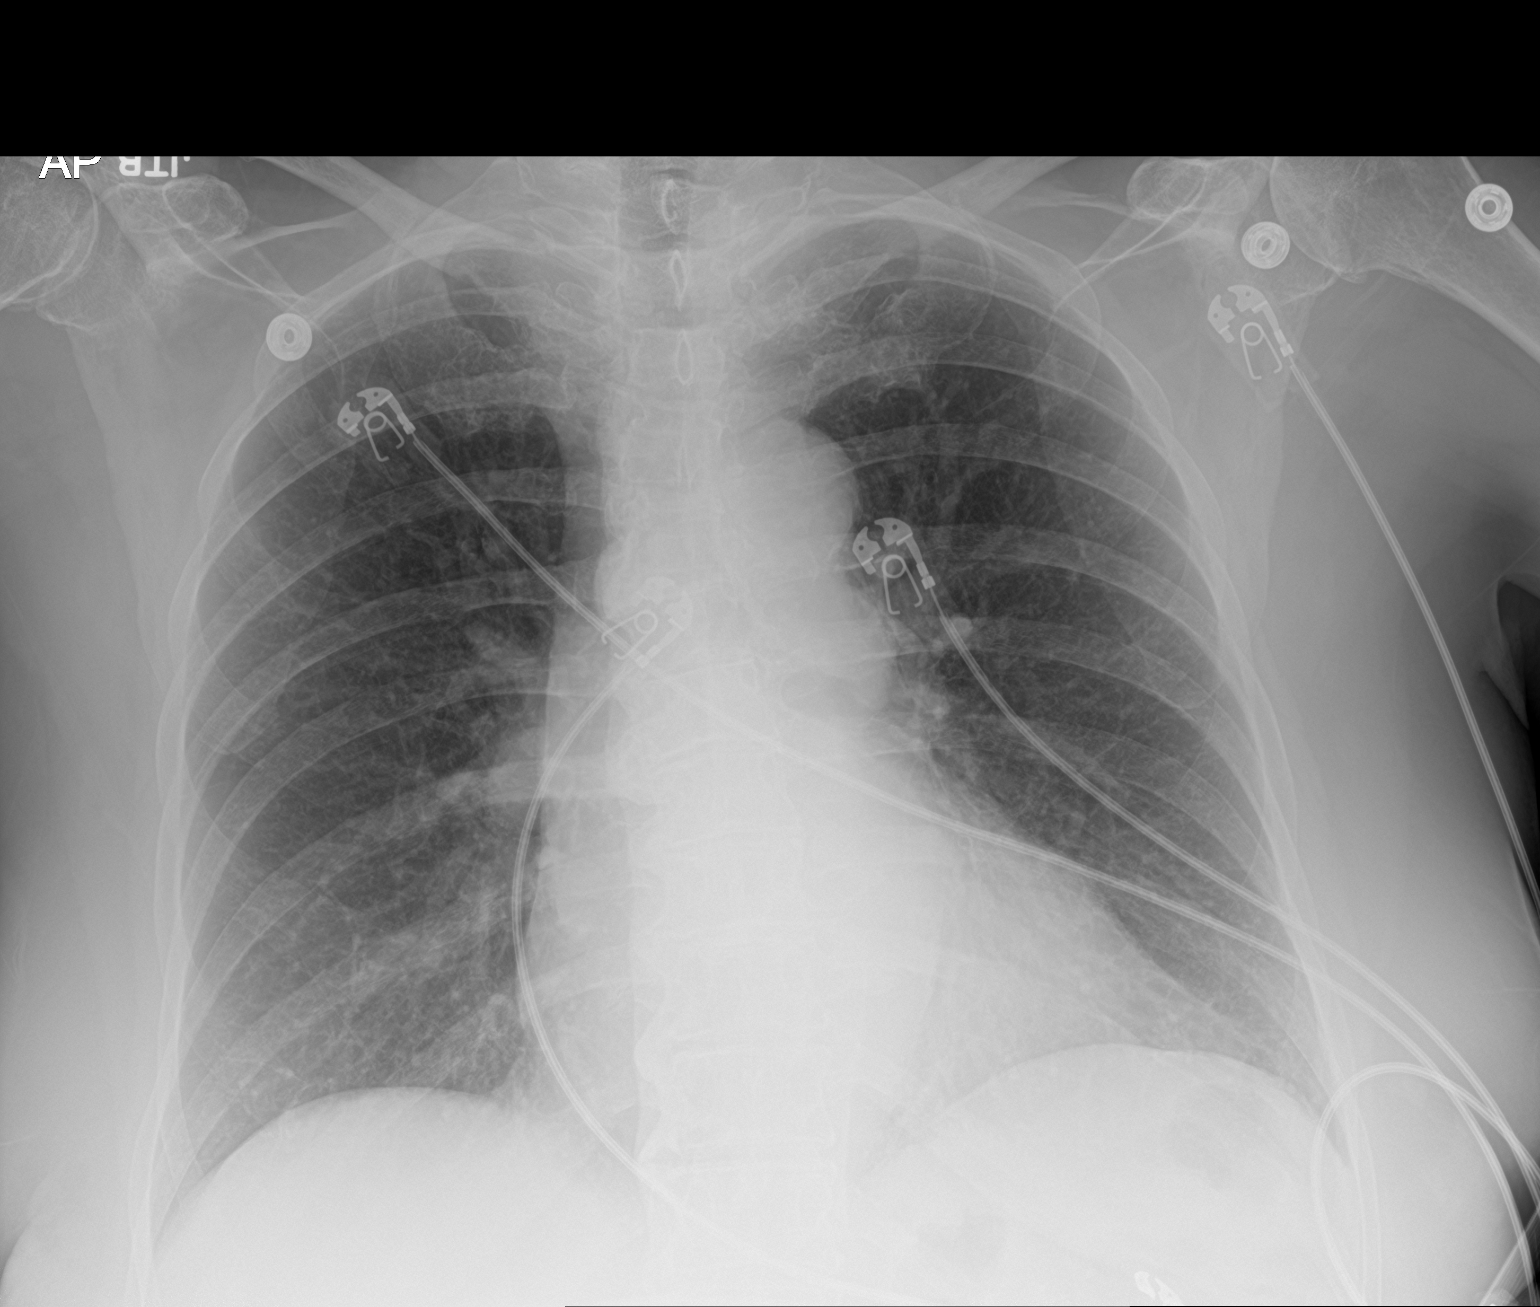

[2 of 2 positions shown; findings below may reference images not displayed]

FINDINGS: The heart is at the upper limits of normal in size. Normal pulmonary
vascularity. No focal consolidation, pleural effusion, or
pneumothorax. No acute osseous abnormality.
IMPRESSION: No active cardiopulmonary disease.

## 2020-03-23 ENCOUNTER — Ambulatory Visit: Payer: Medicare PPO

## 2020-04-06 DIAGNOSIS — Z23 Encounter for immunization: Secondary | ICD-10-CM | POA: Diagnosis not present

## 2020-04-14 DIAGNOSIS — I480 Paroxysmal atrial fibrillation: Secondary | ICD-10-CM | POA: Diagnosis not present

## 2020-04-14 DIAGNOSIS — Z7901 Long term (current) use of anticoagulants: Secondary | ICD-10-CM | POA: Diagnosis not present

## 2020-04-14 DIAGNOSIS — Z5181 Encounter for therapeutic drug level monitoring: Secondary | ICD-10-CM | POA: Diagnosis not present

## 2020-04-14 DIAGNOSIS — Z79899 Other long term (current) drug therapy: Secondary | ICD-10-CM | POA: Diagnosis not present

## 2020-08-10 ENCOUNTER — Other Ambulatory Visit: Payer: Self-pay | Admitting: Family Medicine

## 2020-08-10 DIAGNOSIS — Z1231 Encounter for screening mammogram for malignant neoplasm of breast: Secondary | ICD-10-CM

## 2020-09-20 DIAGNOSIS — H10503 Unspecified blepharoconjunctivitis, bilateral: Secondary | ICD-10-CM | POA: Diagnosis not present

## 2020-09-20 DIAGNOSIS — H04123 Dry eye syndrome of bilateral lacrimal glands: Secondary | ICD-10-CM | POA: Diagnosis not present

## 2020-10-02 ENCOUNTER — Other Ambulatory Visit: Payer: Self-pay

## 2020-10-02 ENCOUNTER — Ambulatory Visit
Admission: RE | Admit: 2020-10-02 | Discharge: 2020-10-02 | Disposition: A | Discharge status: Home / Self Care | Payer: Medicare PPO | Source: Ambulatory Visit | Attending: Family Medicine | Admitting: Family Medicine

## 2020-10-02 DIAGNOSIS — Z1231 Encounter for screening mammogram for malignant neoplasm of breast: Secondary | ICD-10-CM | POA: Diagnosis not present

## 2020-10-04 ENCOUNTER — Other Ambulatory Visit: Payer: Self-pay | Admitting: Family Medicine

## 2020-10-04 DIAGNOSIS — R928 Other abnormal and inconclusive findings on diagnostic imaging of breast: Secondary | ICD-10-CM

## 2020-10-05 DIAGNOSIS — Z5181 Encounter for therapeutic drug level monitoring: Secondary | ICD-10-CM | POA: Diagnosis not present

## 2020-10-05 DIAGNOSIS — Z7901 Long term (current) use of anticoagulants: Secondary | ICD-10-CM | POA: Diagnosis not present

## 2020-10-05 DIAGNOSIS — I480 Paroxysmal atrial fibrillation: Secondary | ICD-10-CM | POA: Diagnosis not present

## 2020-11-08 DIAGNOSIS — R69 Illness, unspecified: Secondary | ICD-10-CM | POA: Diagnosis not present

## 2020-11-14 ENCOUNTER — Other Ambulatory Visit: Payer: Self-pay

## 2020-11-14 ENCOUNTER — Emergency Department (HOSPITAL_BASED_OUTPATIENT_CLINIC_OR_DEPARTMENT_OTHER): Payer: Medicare PPO

## 2020-11-14 ENCOUNTER — Encounter (HOSPITAL_BASED_OUTPATIENT_CLINIC_OR_DEPARTMENT_OTHER): Payer: Self-pay

## 2020-11-14 ENCOUNTER — Emergency Department (HOSPITAL_BASED_OUTPATIENT_CLINIC_OR_DEPARTMENT_OTHER)
Admission: EM | Admit: 2020-11-14 | Discharge: 2020-11-14 | Disposition: A | Discharge status: Home / Self Care | Payer: Medicare PPO | Attending: Emergency Medicine | Admitting: Emergency Medicine

## 2020-11-14 DIAGNOSIS — I5030 Unspecified diastolic (congestive) heart failure: Secondary | ICD-10-CM | POA: Diagnosis not present

## 2020-11-14 DIAGNOSIS — Z87891 Personal history of nicotine dependence: Secondary | ICD-10-CM | POA: Diagnosis not present

## 2020-11-14 DIAGNOSIS — Y9289 Other specified places as the place of occurrence of the external cause: Secondary | ICD-10-CM | POA: Insufficient documentation

## 2020-11-14 DIAGNOSIS — W1809XA Striking against other object with subsequent fall, initial encounter: Secondary | ICD-10-CM | POA: Diagnosis not present

## 2020-11-14 DIAGNOSIS — R220 Localized swelling, mass and lump, head: Secondary | ICD-10-CM | POA: Diagnosis not present

## 2020-11-14 DIAGNOSIS — Z7901 Long term (current) use of anticoagulants: Secondary | ICD-10-CM | POA: Diagnosis not present

## 2020-11-14 DIAGNOSIS — I110 Hypertensive heart disease with heart failure: Secondary | ICD-10-CM | POA: Diagnosis not present

## 2020-11-14 DIAGNOSIS — G319 Degenerative disease of nervous system, unspecified: Secondary | ICD-10-CM | POA: Diagnosis not present

## 2020-11-14 DIAGNOSIS — S0990XA Unspecified injury of head, initial encounter: Secondary | ICD-10-CM | POA: Diagnosis not present

## 2020-11-14 DIAGNOSIS — I4891 Unspecified atrial fibrillation: Secondary | ICD-10-CM | POA: Diagnosis not present

## 2020-11-14 DIAGNOSIS — S7001XA Contusion of right hip, initial encounter: Secondary | ICD-10-CM | POA: Diagnosis not present

## 2020-11-14 DIAGNOSIS — W19XXXA Unspecified fall, initial encounter: Secondary | ICD-10-CM

## 2020-11-14 LAB — CBC WITH DIFFERENTIAL/PLATELET
Abs Immature Granulocytes: 0.02 10*3/uL (ref 0.00–0.07)
Basophils Absolute: 0 10*3/uL (ref 0.0–0.1)
Basophils Relative: 0 %
Eosinophils Absolute: 0.1 10*3/uL (ref 0.0–0.5)
Eosinophils Relative: 1 %
HCT: 38.4 % (ref 36.0–46.0)
Hemoglobin: 12.3 g/dL (ref 12.0–15.0)
Immature Granulocytes: 0 %
Lymphocytes Relative: 30 %
Lymphs Abs: 2.1 10*3/uL (ref 0.7–4.0)
MCH: 28.4 pg (ref 26.0–34.0)
MCHC: 32 g/dL (ref 30.0–36.0)
MCV: 88.7 fL (ref 80.0–100.0)
Monocytes Absolute: 0.6 10*3/uL (ref 0.1–1.0)
Monocytes Relative: 8 %
Neutro Abs: 4.2 10*3/uL (ref 1.7–7.7)
Neutrophils Relative %: 61 %
Platelets: 224 10*3/uL (ref 150–400)
RBC: 4.33 MIL/uL (ref 3.87–5.11)
RDW: 13.3 % (ref 11.5–15.5)
WBC: 7 10*3/uL (ref 4.0–10.5)
nRBC: 0 % (ref 0.0–0.2)

## 2020-11-14 LAB — BASIC METABOLIC PANEL
Anion gap: 8 (ref 5–15)
BUN: 17 mg/dL (ref 8–23)
CO2: 28 mmol/L (ref 22–32)
Calcium: 9.3 mg/dL (ref 8.9–10.3)
Chloride: 103 mmol/L (ref 98–111)
Creatinine, Ser: 0.61 mg/dL (ref 0.44–1.00)
GFR, Estimated: >60 mL/min (ref >60)
Glucose, Bld: 112 mg/dL — ABNORMAL HIGH (ref 70–99)
Potassium: 4.3 mmol/L (ref 3.5–5.1)
Sodium: 139 mmol/L (ref 135–145)

## 2020-11-14 LAB — PROTIME-INR
INR: 0.9 (ref 0.8–1.2)
Prothrombin Time: 12.5 seconds (ref 11.4–15.2)

## 2020-11-14 NOTE — ED Provider Notes (Signed)
Emergency Department Provider Note   I have reviewed the triage vital signs and the nursing notes.   HISTORY  Chief Complaint Fall   HPI Kelly Walker is a 75 y.o. female with past medical history of A. fib on Eliquis presents to the emergency department after a fall 5 days ago.  She fell landing on her right side including her right hip, elbow, head.  She denies lingering headache, loss of consciousness although has felt a little bit "foggy" at times.  No pain in the neck or back.  She is developed bruising mainly to the right lateral hip.  She states it spreading down the leg and changing colors.  She is not had fevers or chills.  No numbness or weakness into the leg.  She has been standing and walking on the hip without pain or difficulty.  She feels that overall the swelling is decreasing slowly but with the color change she is seeking evaluation.   Past Medical History:  Diagnosis Date   Atrial fibrillation ([place])    on Eliquis   Diverticulosis    Gout    Grade I diastolic dysfunction    Hyperlipidemia    Menopause    SBO (small bowel obstruction) ([place]) 11/06/2012    Patient Active Problem List   Diagnosis Date Noted   Atrial fibrillation with RVR ([place]) 11/06/2017   HLD (hyperlipidemia) 11/06/2017   Paroxysmal atrial fibrillation ([place]) 10/23/2017   Pain in finger of left hand 09/20/2016   Trigger finger, left ring finger 09/20/2016   DJD (degenerative joint disease), multiple sites 03/20/2014   History of bone density study 04/19/2013   Estrogen deficiency 02/18/2013   Elevated uric acid in blood 02/18/2013   Personal history of colonic polyps 02/18/2013   SBO (small bowel obstruction) ([place]) 11/06/2012   DJD (degenerative joint disease) 08/06/2011   Obesity 08/06/2011   Family history of colon cancer requiring screening colonoscopy 08/06/2011   Elevated blood pressure 07/24/2011   Hyperlipidemia    Menopause    Diverticulosis     Past Surgical History:   Procedure Laterality Date   ABDOMINAL HYSTERECTOMY  2001   APPENDECTOMY  2001   CARDIOVERSION N/A 11/07/2017   Procedure: CARDIOVERSION;  Surgeon: Larey Dresser, MD;  Location: [facility] ENDOSCOPY;  Service: Cardiovascular;  Laterality: N/A;   CATARACT EXTRACTION Bilateral    TONSILLECTOMY  5449   UMBILICAL HERNIA REPAIR  2001    Allergies Lecithin, Other, Potassium nitrate, Sulfa antibiotics, Sulfasalazine, Ciprofloxacin, and Flagyl [metronidazole]  Family History  Problem Relation Age of Onset   Heart failure Mother    Diabetes Mother    Heart attack Father    Colon cancer Father 65   Colon cancer Paternal Uncle 4   Stomach cancer Cousin     Social History Social History   Tobacco Use   Smoking status: Former    Packs/day: 1.00    Years: 4.00    Pack years: 4.00    Types: Cigarettes    Quit date: 06/10/1972    Years since quitting: 48.4   Smokeless tobacco: Never  Vaping Use   Vaping Use: Never used  Substance Use Topics   Alcohol use: Yes    Comment: occasional-social   Drug use: No    Review of Systems  Constitutional: No fever/chills Eyes: No visual changes. ENT: No sore throat. Cardiovascular: Denies chest pain. Respiratory: Denies shortness of breath. Gastrointestinal: No abdominal pain.  No nausea, no vomiting.  No diarrhea.  No constipation. Genitourinary:  Negative for dysuria. Musculoskeletal: Negative for back pain. Skin: Negative for rash. Right lateral hip bruising.  Neurological: Negative for headaches, focal weakness or   10-point ROS otherwise negative.  ____________________________________________   PHYSICAL EXAM:  VITAL SIGNS: ED Triage Vitals  Enc Vitals Group     BP 11/14/20 1600 115/70     Pulse Rate 11/14/20 1600 81     Resp 11/14/20 1600 18     Temp 11/14/20 1600 98.4 F (36.9 C)     Temp Source 11/14/20 1600 Oral     SpO2 11/14/20 1600 95 %     Weight 11/14/20 1602 141 lb 1.5 oz (64 kg)     Height 11/14/20 1602 5\' 5"  (1.651  m)   Constitutional: Alert and oriented. Well appearing and in no acute distress. Eyes: Conjunctivae are normal.  Head: Atraumatic. Nose: No congestion/rhinnorhea. Mouth/Throat: Mucous membranes are moist.  Oropharynx non-erythematous. Neck: No stridor.  No cervical spine tenderness to palpation Cardiovascular: Normal rate, regular rhythm. Good peripheral circulation. Grossly normal heart sounds.   Respiratory: Normal respiratory effort.  No retractions. Lungs CTAB. Gastrointestinal: Soft and nontender. No distention.  Musculoskeletal: No lower extremity tenderness nor edema. No gross deformities of extremities. Neurologic:  Normal speech and language. No gross focal neurologic deficits are appreciated.  Skin: Patient has a fairly large right lateral hip hematoma with some bruising spreading down the back of the leg.  The bruising is in various stages of color with deep purple and some faint green discoloration.  No skin tears or active bleeding.  The hematoma is soft.  Thigh is not firm or indurated.    ____________________________________________   LABS (all labs ordered are listed, but only abnormal results are displayed)  Labs Reviewed  BASIC METABOLIC PANEL - Abnormal; Notable for the following components:      Result Value   Glucose, Bld 112 (*)    All other components within normal limits  CBC WITH DIFFERENTIAL/PLATELET  PROTIME-INR   ____________________________________________  RADIOLOGY  CT head reviewed.   ____________________________________________   PROCEDURES  Procedure(s) performed:   Procedures  None ____________________________________________   INITIAL IMPRESSION / ASSESSMENT AND PLAN / ED COURSE  Pertinent labs & imaging results that were available during my care of the patient were reviewed by me and considered in my medical decision making (see chart for details).   Patient presents to the emergency department with large right lateral thigh  hematoma.  The hematoma is soft and does not appear tense.  Patient describes some decreased swelling but color change.  This is consistent with old bruising.  I doubt ongoing bleeding clinically.  Plan for labs to evaluate for anemia and will obtain CT imaging of the head as this was not done after initial fall and she has been feeling a little bit "foggy."   CT head negative. Labs are reassuring. Vitals WNL. Plan for hematoma mgmt. Not consistent with compartment syndrome or expanding hematoma. Plan for close PCP follow up.  ____________________________________________  FINAL CLINICAL IMPRESSION(S) / ED DIAGNOSES  Final diagnoses:  Fall, initial encounter  Injury of head, initial encounter  Hematoma of right hip, initial encounter     Note:  This document was prepared using Dragon voice recognition software and may include unintentional dictation errors.  Nanda Quinton, MD, [facility] Emergency Medicine    Mende Biswell, [place], MD 11/16/20 [phone]

## 2020-11-14 NOTE — Discharge Instructions (Signed)
You were seen in the emergency department today with a hematoma to the right hip.  This will gradually resorb but will take likely several weeks.  You will notice color changes throughout this time.  The CT scan of your head did not show any fracture or bleeding.  Your lab work here was normal with no signs of anemia.  Please follow closely with your primary care doctor.

## 2020-11-14 NOTE — ED Triage Notes (Signed)
Patient fell last Thursday hitting her right side of her head and right thigh, patient is on eliquis.  Denies any pain in her head, denies any nuero deficits,  thigh pain "sometimes after I've been sitting for a while and I get up".

## 2020-11-15 ENCOUNTER — Other Ambulatory Visit: Payer: Medicare PPO

## 2020-11-16 DIAGNOSIS — D6869 Other thrombophilia: Secondary | ICD-10-CM | POA: Diagnosis not present

## 2020-11-16 DIAGNOSIS — I480 Paroxysmal atrial fibrillation: Secondary | ICD-10-CM | POA: Diagnosis not present

## 2020-11-16 DIAGNOSIS — K9049 Malabsorption due to intolerance, not elsewhere classified: Secondary | ICD-10-CM | POA: Diagnosis not present

## 2020-11-16 DIAGNOSIS — T148XXA Other injury of unspecified body region, initial encounter: Secondary | ICD-10-CM | POA: Diagnosis not present

## 2020-11-23 DIAGNOSIS — Z79899 Other long term (current) drug therapy: Secondary | ICD-10-CM | POA: Diagnosis not present

## 2020-11-23 DIAGNOSIS — I480 Paroxysmal atrial fibrillation: Secondary | ICD-10-CM | POA: Diagnosis not present

## 2020-11-23 DIAGNOSIS — Z5181 Encounter for therapeutic drug level monitoring: Secondary | ICD-10-CM | POA: Diagnosis not present

## 2020-12-04 ENCOUNTER — Ambulatory Visit: Payer: Medicare PPO

## 2020-12-04 ENCOUNTER — Other Ambulatory Visit: Payer: Self-pay

## 2020-12-04 ENCOUNTER — Ambulatory Visit
Admission: RE | Admit: 2020-12-04 | Discharge: 2020-12-04 | Disposition: A | Discharge status: Home / Self Care | Payer: Medicare PPO | Source: Ambulatory Visit | Attending: Family Medicine | Admitting: Family Medicine

## 2020-12-04 DIAGNOSIS — R922 Inconclusive mammogram: Secondary | ICD-10-CM | POA: Diagnosis not present

## 2020-12-04 DIAGNOSIS — R928 Other abnormal and inconclusive findings on diagnostic imaging of breast: Secondary | ICD-10-CM

## 2020-12-14 ENCOUNTER — Other Ambulatory Visit: Payer: Self-pay | Admitting: Family Medicine

## 2020-12-14 DIAGNOSIS — E2839 Other primary ovarian failure: Secondary | ICD-10-CM

## 2021-03-20 DIAGNOSIS — R7303 Prediabetes: Secondary | ICD-10-CM | POA: Diagnosis not present

## 2021-03-20 DIAGNOSIS — I480 Paroxysmal atrial fibrillation: Secondary | ICD-10-CM | POA: Diagnosis not present

## 2021-03-20 DIAGNOSIS — Z79899 Other long term (current) drug therapy: Secondary | ICD-10-CM | POA: Diagnosis not present

## 2021-03-20 DIAGNOSIS — E559 Vitamin D deficiency, unspecified: Secondary | ICD-10-CM | POA: Diagnosis not present

## 2021-03-20 DIAGNOSIS — Z0000 Encounter for general adult medical examination without abnormal findings: Secondary | ICD-10-CM | POA: Diagnosis not present

## 2021-03-20 DIAGNOSIS — Z23 Encounter for immunization: Secondary | ICD-10-CM | POA: Diagnosis not present

## 2021-03-20 DIAGNOSIS — I10 Essential (primary) hypertension: Secondary | ICD-10-CM | POA: Diagnosis not present

## 2021-03-20 DIAGNOSIS — M25562 Pain in left knee: Secondary | ICD-10-CM | POA: Diagnosis not present

## 2021-03-20 DIAGNOSIS — E7800 Pure hypercholesterolemia, unspecified: Secondary | ICD-10-CM | POA: Diagnosis not present

## 2021-04-02 DIAGNOSIS — M76892 Other specified enthesopathies of left lower limb, excluding foot: Secondary | ICD-10-CM | POA: Diagnosis not present

## 2021-04-02 DIAGNOSIS — M1712 Unilateral primary osteoarthritis, left knee: Secondary | ICD-10-CM | POA: Diagnosis not present

## 2021-04-10 DIAGNOSIS — I480 Paroxysmal atrial fibrillation: Secondary | ICD-10-CM | POA: Diagnosis not present

## 2021-05-29 ENCOUNTER — Ambulatory Visit
Admission: RE | Admit: 2021-05-29 | Discharge: 2021-05-29 | Disposition: A | Discharge status: Home / Self Care | Payer: Medicare PPO | Source: Ambulatory Visit | Attending: Family Medicine | Admitting: Family Medicine

## 2021-05-29 DIAGNOSIS — Z780 Asymptomatic menopausal state: Secondary | ICD-10-CM | POA: Diagnosis not present

## 2021-05-29 DIAGNOSIS — E2839 Other primary ovarian failure: Secondary | ICD-10-CM

## 2021-06-22 DIAGNOSIS — R195 Other fecal abnormalities: Secondary | ICD-10-CM | POA: Diagnosis not present

## 2021-06-22 DIAGNOSIS — K9049 Malabsorption due to intolerance, not elsewhere classified: Secondary | ICD-10-CM | POA: Diagnosis not present

## 2021-09-06 DIAGNOSIS — R112 Nausea with vomiting, unspecified: Secondary | ICD-10-CM | POA: Diagnosis not present

## 2021-09-06 DIAGNOSIS — I10 Essential (primary) hypertension: Secondary | ICD-10-CM | POA: Diagnosis not present

## 2021-09-08 ENCOUNTER — Emergency Department (HOSPITAL_BASED_OUTPATIENT_CLINIC_OR_DEPARTMENT_OTHER): Payer: Medicare PPO

## 2021-09-08 ENCOUNTER — Encounter (HOSPITAL_BASED_OUTPATIENT_CLINIC_OR_DEPARTMENT_OTHER): Payer: Self-pay | Admitting: Emergency Medicine

## 2021-09-08 ENCOUNTER — Other Ambulatory Visit: Payer: Self-pay

## 2021-09-08 ENCOUNTER — Inpatient Hospital Stay (HOSPITAL_BASED_OUTPATIENT_CLINIC_OR_DEPARTMENT_OTHER)
Admission: EM | Admit: 2021-09-08 | Discharge: 2021-09-13 | DRG: 389 | Disposition: A | Discharge status: Home / Self Care | Payer: Medicare PPO | Attending: Family Medicine | Admitting: Family Medicine

## 2021-09-08 DIAGNOSIS — M160 Bilateral primary osteoarthritis of hip: Secondary | ICD-10-CM | POA: Diagnosis not present

## 2021-09-08 DIAGNOSIS — R739 Hyperglycemia, unspecified: Secondary | ICD-10-CM | POA: Diagnosis present

## 2021-09-08 DIAGNOSIS — Z881 Allergy status to other antibiotic agents status: Secondary | ICD-10-CM

## 2021-09-08 DIAGNOSIS — Z87891 Personal history of nicotine dependence: Secondary | ICD-10-CM

## 2021-09-08 DIAGNOSIS — E785 Hyperlipidemia, unspecified: Secondary | ICD-10-CM | POA: Diagnosis not present

## 2021-09-08 DIAGNOSIS — R040 Epistaxis: Secondary | ICD-10-CM | POA: Diagnosis not present

## 2021-09-08 DIAGNOSIS — K56609 Unspecified intestinal obstruction, unspecified as to partial versus complete obstruction: Principal | ICD-10-CM | POA: Diagnosis present

## 2021-09-08 DIAGNOSIS — Z79899 Other long term (current) drug therapy: Secondary | ICD-10-CM | POA: Diagnosis not present

## 2021-09-08 DIAGNOSIS — K5651 Intestinal adhesions [bands], with partial obstruction: Principal | ICD-10-CM | POA: Diagnosis present

## 2021-09-08 DIAGNOSIS — E860 Dehydration: Secondary | ICD-10-CM | POA: Diagnosis not present

## 2021-09-08 DIAGNOSIS — K5730 Diverticulosis of large intestine without perforation or abscess without bleeding: Secondary | ICD-10-CM | POA: Diagnosis present

## 2021-09-08 DIAGNOSIS — Z833 Family history of diabetes mellitus: Secondary | ICD-10-CM | POA: Diagnosis not present

## 2021-09-08 DIAGNOSIS — K5939 Other megacolon: Secondary | ICD-10-CM | POA: Diagnosis not present

## 2021-09-08 DIAGNOSIS — I480 Paroxysmal atrial fibrillation: Secondary | ICD-10-CM | POA: Diagnosis not present

## 2021-09-08 DIAGNOSIS — Z7901 Long term (current) use of anticoagulants: Secondary | ICD-10-CM | POA: Diagnosis not present

## 2021-09-08 DIAGNOSIS — Z8249 Family history of ischemic heart disease and other diseases of the circulatory system: Secondary | ICD-10-CM | POA: Diagnosis not present

## 2021-09-08 DIAGNOSIS — M109 Gout, unspecified: Secondary | ICD-10-CM | POA: Diagnosis not present

## 2021-09-08 DIAGNOSIS — N179 Acute kidney failure, unspecified: Secondary | ICD-10-CM | POA: Diagnosis not present

## 2021-09-08 DIAGNOSIS — K8020 Calculus of gallbladder without cholecystitis without obstruction: Secondary | ICD-10-CM | POA: Diagnosis present

## 2021-09-08 DIAGNOSIS — Z800 Family history of malignant neoplasm of digestive organs: Secondary | ICD-10-CM | POA: Diagnosis not present

## 2021-09-08 DIAGNOSIS — E871 Hypo-osmolality and hyponatremia: Secondary | ICD-10-CM | POA: Diagnosis present

## 2021-09-08 DIAGNOSIS — Z4682 Encounter for fitting and adjustment of non-vascular catheter: Secondary | ICD-10-CM | POA: Diagnosis not present

## 2021-09-08 DIAGNOSIS — Z882 Allergy status to sulfonamides status: Secondary | ICD-10-CM

## 2021-09-08 DIAGNOSIS — K5669 Other partial intestinal obstruction: Secondary | ICD-10-CM | POA: Diagnosis not present

## 2021-09-08 DIAGNOSIS — Z888 Allergy status to other drugs, medicaments and biological substances status: Secondary | ICD-10-CM

## 2021-09-08 DIAGNOSIS — K56600 Partial intestinal obstruction, unspecified as to cause: Secondary | ICD-10-CM | POA: Diagnosis not present

## 2021-09-08 DIAGNOSIS — E876 Hypokalemia: Secondary | ICD-10-CM | POA: Diagnosis not present

## 2021-09-08 DIAGNOSIS — R9431 Abnormal electrocardiogram [ECG] [EKG]: Secondary | ICD-10-CM | POA: Diagnosis not present

## 2021-09-08 DIAGNOSIS — K6389 Other specified diseases of intestine: Secondary | ICD-10-CM | POA: Diagnosis not present

## 2021-09-08 DIAGNOSIS — R112 Nausea with vomiting, unspecified: Secondary | ICD-10-CM | POA: Diagnosis not present

## 2021-09-08 LAB — CBC WITH DIFFERENTIAL/PLATELET
Abs Immature Granulocytes: 0.02 10*3/uL (ref 0.00–0.07)
Basophils Absolute: 0 10*3/uL (ref 0.0–0.1)
Basophils Relative: 1 %
Eosinophils Absolute: 0 10*3/uL (ref 0.0–0.5)
Eosinophils Relative: 0 %
HCT: 50 % — ABNORMAL HIGH (ref 36.0–46.0)
Hemoglobin: 16.5 g/dL — ABNORMAL HIGH (ref 12.0–15.0)
Immature Granulocytes: 0 %
Lymphocytes Relative: 25 %
Lymphs Abs: 1.7 10*3/uL (ref 0.7–4.0)
MCH: 28.2 pg (ref 26.0–34.0)
MCHC: 33 g/dL (ref 30.0–36.0)
MCV: 85.5 fL (ref 80.0–100.0)
Monocytes Absolute: 1.5 10*3/uL — ABNORMAL HIGH (ref 0.1–1.0)
Monocytes Relative: 21 %
Neutro Abs: 3.6 10*3/uL (ref 1.7–7.7)
Neutrophils Relative %: 53 %
Platelets: 265 10*3/uL (ref 150–400)
RBC: 5.85 MIL/uL — ABNORMAL HIGH (ref 3.87–5.11)
RDW: 12.2 % (ref 11.5–15.5)
WBC: 6.9 10*3/uL (ref 4.0–10.5)
nRBC: 0 % (ref 0.0–0.2)

## 2021-09-08 LAB — HEPARIN LEVEL (UNFRACTIONATED): Heparin Unfractionated: >1.1 IU/mL — ABNORMAL HIGH (ref 0.30–0.70)

## 2021-09-08 LAB — COMPREHENSIVE METABOLIC PANEL
ALT: 15 U/L (ref 0–44)
AST: 12 U/L — ABNORMAL LOW (ref 15–41)
Albumin: 4.7 g/dL (ref 3.5–5.0)
Alkaline Phosphatase: 71 U/L (ref 38–126)
Anion gap: 17 — ABNORMAL HIGH (ref 5–15)
BUN: 43 mg/dL — ABNORMAL HIGH (ref 8–23)
CO2: 25 mmol/L (ref 22–32)
Calcium: 10.8 mg/dL — ABNORMAL HIGH (ref 8.9–10.3)
Chloride: 92 mmol/L — ABNORMAL LOW (ref 98–111)
Creatinine, Ser: 1.32 mg/dL — ABNORMAL HIGH (ref 0.44–1.00)
GFR, Estimated: 42 mL/min — ABNORMAL LOW (ref >60)
Glucose, Bld: 142 mg/dL — ABNORMAL HIGH (ref 70–99)
Potassium: 4.2 mmol/L (ref 3.5–5.1)
Sodium: 134 mmol/L — ABNORMAL LOW (ref 135–145)
Total Bilirubin: 1.2 mg/dL (ref 0.3–1.2)
Total Protein: 8.9 g/dL — ABNORMAL HIGH (ref 6.5–8.1)

## 2021-09-08 LAB — LIPASE, BLOOD: Lipase: 26 U/L (ref 11–51)

## 2021-09-08 LAB — APTT: aPTT: 24 seconds (ref 24–36)

## 2021-09-08 MED ORDER — DIATRIZOATE MEGLUMINE & SODIUM 66-10 % PO SOLN
90.0000 mL | Freq: Once | ORAL | Status: AC
  Administered 2021-09-08: 90 mL via NASOGASTRIC
  Filled 2021-09-08: qty 90

## 2021-09-08 MED ORDER — SODIUM CHLORIDE 0.9 % IV BOLUS
1000.0000 mL | Freq: Once | INTRAVENOUS | Status: AC
  Administered 2021-09-08: 1000 mL via INTRAVENOUS

## 2021-09-08 MED ORDER — PROCHLORPERAZINE EDISYLATE 10 MG/2ML IJ SOLN: 10.0000 mg | Freq: Four times a day (QID) | INTRAMUSCULAR | Status: DC | PRN

## 2021-09-08 MED ORDER — MORPHINE SULFATE (PF) 2 MG/ML IV SOLN: 2.0000 mg | INTRAVENOUS | Status: DC | PRN

## 2021-09-08 MED ORDER — OXYMETAZOLINE HCL 0.05 % NA SOLN
1.0000 | Freq: Once | NASAL | Status: AC
  Administered 2021-09-08: 1 via NASAL
  Filled 2021-09-08: qty 30

## 2021-09-08 MED ORDER — HEPARIN (PORCINE) 25000 UT/250ML-% IV SOLN
1000.0000 [IU]/h | INTRAVENOUS | Status: DC
  Administered 2021-09-08: 1000 [IU]/h via INTRAVENOUS
  Filled 2021-09-08 (×2): qty 250

## 2021-09-08 MED ORDER — IOHEXOL 300 MG/ML  SOLN
100.0000 mL | Freq: Once | INTRAMUSCULAR | Status: AC | PRN
  Administered 2021-09-08: 80 mL via INTRAVENOUS

## 2021-09-08 MED ORDER — SODIUM CHLORIDE 0.9 % IV SOLN: INTRAVENOUS | Status: DC

## 2021-09-08 MED ORDER — LIDOCAINE HCL URETHRAL/MUCOSAL 2 % EX GEL
1.0000 "application " | Freq: Once | CUTANEOUS | Status: AC
  Administered 2021-09-08: 1 via TOPICAL
  Filled 2021-09-08: qty 11

## 2021-09-08 MED ORDER — FENTANYL CITRATE PF 50 MCG/ML IJ SOSY
50.0000 ug | PREFILLED_SYRINGE | Freq: Once | INTRAMUSCULAR | Status: DC
  Filled 2021-09-08 (×2): qty 1

## 2021-09-08 MED ORDER — METOPROLOL TARTRATE 5 MG/5ML IV SOLN
5.0000 mg | Freq: Four times a day (QID) | INTRAVENOUS | Status: DC | PRN
  Administered 2021-09-11: 5 mg via INTRAVENOUS
  Filled 2021-09-08: qty 5

## 2021-09-08 MED ORDER — ONDANSETRON HCL 4 MG/2ML IJ SOLN
4.0000 mg | Freq: Once | INTRAMUSCULAR | Status: DC
  Filled 2021-09-08: qty 2

## 2021-09-08 NOTE — ED Provider Notes (Signed)
?[facility] EMERGENCY DEPT ?Provider Note ? ? ?CSN: [id number] ?Arrival date & time: 09/08/21  0998 ? ?  ? ?History ? ?Chief Complaint  ?Patient presents with  ? Nausea  ? ? ?Kelly Walker is a 76 y.o. female. ? ?HPI ?76 year old female presents today complaining of nausea, vomiting, abdominal pain.  She states the symptoms began Tuesday night into Wednesday morning.  Initially it was a large amount of vomiting with what she describes as some bandlike pressure across her abdomen.  She was seen and evaluated at an urgent care.  She was given Zofran.  She states that she feels like the Zofran has worked and comes.  She has been attempting to take Pedialyte but has had continued nausea and vomiting.  She has not noted fever or chills.  She has had 2 small bowel obstructions after having hysterectomy in the distant past.  She is having 5 out of 10 abdominal pain and ongoing nausea this morning.  She denies any urinary tract infectious symptoms.  She denies any blood in her emesis, bile in her emesis, or blood per rectum.  She is on Eliquis. ?  ? ?Home Medications ?Prior to Admission medications   ?Medication Sig Start Date End Date Taking? Authorizing Provider  ?flecainide (TAMBOCOR) 100 MG tablet Take 100 mg by mouth 2 (two) times daily.   Yes [provider]  ?diltiazem (CARDIZEM CD) 120 MG 24 hr capsule TAKE 1 CAPSULE BY MOUTH EVERY DAY 01/20/20   [place], Casimiro Needle, RPH-CPP  ?ELIQUIS 5 MG TABS tablet TAKE 1 TABLET BY MOUTH TWICE DAILY 02/08/19   Sherran Needs, NP  ?metoprolol tartrate (LOPRESSOR) 25 MG tablet Take 0.5 tablets (12.5 mg total) by mouth 2 (two) times daily. 11/11/17   Sherran Needs, NP  ?Probiotic Product (PROBIOTIC DAILY PO) Take 1 capsule by mouth daily.     [provider]  ?   ? ?Allergies    ?Lecithin, Other, Potassium nitrate, Sulfa antibiotics, Sulfasalazine, Ciprofloxacin, and Flagyl [metronidazole]   ? ?Review of Systems   ?Review of Systems  ?All other systems  reviewed and are negative. ? ?Physical Exam ?Updated Vital Signs ?BP 136/74   Pulse 67   Temp 98 ?F (36.7 ?C)   Resp 20   SpO2 95%  ?Physical Exam ?Vitals and nursing note reviewed.  ?Constitutional:   ?   General: She is not in acute distress. ?   Appearance: Normal appearance.  ?HENT:  ?   Head: Normocephalic.  ?   Right Ear: External ear normal.  ?   Left Ear: External ear normal.  ?   Nose: Nose normal.  ?   Mouth/Throat:  ?   Pharynx: Oropharynx is clear.  ?Eyes:  ?   Pupils: Pupils are equal, round, and reactive to light.  ?Cardiovascular:  ?   Rate and Rhythm: Normal rate and regular rhythm.  ?Pulmonary:  ?   Effort: Pulmonary effort is normal.  ?   Breath sounds: Normal breath sounds.  ?Abdominal:  ?   General: Abdomen is flat. Bowel sounds are increased.  ?   Palpations: Abdomen is soft.  ?   Tenderness: There is generalized abdominal tenderness.  ?   Hernia: No hernia is present.  ?Musculoskeletal:  ?   Cervical back: Normal range of motion and neck supple.  ?Neurological:  ?   Mental Status: She is alert.  ? ? ?ED Results / Procedures / Treatments   ?Labs ?(all labs ordered are listed, but only  abnormal results are displayed) ?Labs Reviewed  ?CBC WITH DIFFERENTIAL/PLATELET - Abnormal; Notable for the following components:  ?    Result Value  ? RBC 5.85 (*)   ? Hemoglobin 16.5 (*)   ? HCT 50.0 (*)   ? Monocytes Absolute 1.5 (*)   ? All other components within normal limits  ?COMPREHENSIVE METABOLIC PANEL - Abnormal; Notable for the following components:  ? Sodium 134 (*)   ? Chloride 92 (*)   ? Glucose, Bld 142 (*)   ? BUN 43 (*)   ? Creatinine, Ser 1.32 (*)   ? Calcium 10.8 (*)   ? Total Protein 8.9 (*)   ? AST 12 (*)   ? GFR, Estimated 42 (*)   ? Anion gap 17 (*)   ? All other components within normal limits  ?LIPASE, BLOOD  ?URINALYSIS, ROUTINE W REFLEX MICROSCOPIC  ? ? ?EKG ?EKG Interpretation ? ?Date/Time:  Saturday September 08 2021 08:58:24 EDT ?Ventricular Rate:  74 ?PR Interval:  160 ?QRS  Duration: 98 ?QT Interval:  378 ?QTC Calculation: 420 ?R Axis:   48 ?Text Interpretation: Normal sinus rhythm Normal ECG Confirmed by Pattricia Boss [phone]) on 09/08/2021 9:44:12 AM ? ?Radiology ?CT ABDOMEN PELVIS W CONTRAST ? ?Result Date: 09/08/2021 ?CLINICAL DATA:  Abdominal pain, vomiting EXAM: CT ABDOMEN AND PELVIS WITH CONTRAST TECHNIQUE: Multidetector CT imaging of the abdomen and pelvis was performed using the standard protocol following bolus administration of intravenous contrast. RADIATION DOSE REDUCTION: This exam was performed according to the departmental dose-optimization program which includes automated exposure control, adjustment of the mA and/or kV according to patient size and/or use of iterative reconstruction technique. CONTRAST:  55m OMNIPAQUE IOHEXOL 300 MG/ML  SOLN COMPARISON:  11/03/2012 FINDINGS: Lower chest: Unremarkable. Hepatobiliary: No focal abnormality is seen in the liver. There are multiple calcified gallbladder stones. Gallbladder is distended. There is no significant wall thickening in gallbladder. There is no fluid around the gallbladder. There is no dilation of bile ducts. Pancreas: No focal abnormalities are seen. There is pancreatic atrophy. Spleen: Unremarkable. Adrenals/Urinary Tract: Adrenals are unremarkable. There is no hydronephrosis. There are no renal or ureteral stones. Urinary bladder is not distended. Stomach/Bowel: Stomach is unremarkable. There is marked abnormal dilation of proximal small bowel loops measuring up to 5 cm in maximum diameter. There is decompression of distal small bowel loops. Exact level of transition in the caliber is not clearly identified. It is possibly in the right lower quadrant. Degree of small-bowel dilation is worse in comparison to the previous study. Appendix is not distinctly seen. There is no focal pericecal inflammation. There is no significant wall thickening in colon. Scattered diverticula are seen in colon without signs of focal  diverticulitis. Vascular/Lymphatic: There are scattered arterial calcifications. Reproductive: Uterus is not seen. Other: Trace amount of ascites is seen in the pelvis. There is no pneumoperitoneum. There is small ventral hernia containing fat slightly above the level of umbilicus. Small left inguinal hernia containing fat is seen. Musculoskeletal: Degenerative changes are noted in the lumbar spine, particularly severe at L5-S1 level with encroachment of neural foramina. IMPRESSION: High-grade partial small bowel obstruction with transition point in the right lower quadrant of abdomen. Dilated small bowel loops measure up to 5 cm in diameter. This may be caused by adhesions or internal hernia. Distal and terminal ileum appear to be collapsed. There is trace amount of ascites in the pelvic cavity. There is no pneumoperitoneum. Surgical consultation should be considered. There is no hydronephrosis. Gallbladder stones. Diverticulosis  of colon. Other findings as described in the body of the report. Electronically Signed   By: Elmer Picker M.D.   On: 09/08/2021 10:50   ? ?Procedures ?Procedures  ? ? ?Medications Ordered in ED ?Medications  ?fentaNYL (SUBLIMAZE) injection 50 mcg (has no administration in time range)  ?ondansetron [facility]) injection 4 mg (4 mg Intravenous Not Given 09/08/21 0852)  ?sodium chloride 0.9 % bolus 1,000 mL (0 mLs Intravenous Stopped 09/08/21 1006)  ?iohexol (OMNIPAQUE) 300 MG/ML solution 100 mL (80 mLs Intravenous Contrast Given 09/08/21 1004)  ?lidocaine (XYLOCAINE) 2 % jelly 1 application. (1 application. Topical Given 09/08/21 1129)  ?oxymetazoline (AFRIN) 0.05 % nasal spray 1 spray (1 spray Each Nare Given 09/08/21 1129)  ? ? ?ED Course/ Medical Decision Making/ A&P ?Clinical Course as of 09/08/21 1151  ?Sat Sep 08, 2021  ?0911 CBC with Differential(!) ?Reviewed and interpreted with hemoglobin slightly elevated at 16.5 which may represent some hemoconcentration, white blood cell count and  platelets are normal [DR]  ?1610 Complete metabolic panel reviewed and interpreted with some mild hyponatremia, BUN and creatinine increased with first prior creatinine being 0.61 from 6 months ago.  Patient appears to h

## 2021-09-08 NOTE — Progress Notes (Signed)
ANTICOAGULATION CONSULT NOTE - Follow Up Consult ? ?Pharmacy Consult for IV heparin ?Indication: atrial fibrillation ? ?Allergies  ?Allergen Reactions  ? Lecithin Other (See Comments)  ?  Intestinal distress  ? Other   ?  Fluoroquinolones Antibiotics=increased heartbeat,fatigue,lack of focus   ? Potassium Nitrate Other (See Comments)  ?  & Potassium Nitrite=Increased heart rate,disorientation   ? Sulfa Antibiotics   ?  Joint pain  ? Sulfasalazine Other (See Comments)  ?  Joint pain  ? Ciprofloxacin Anxiety  ? Flagyl [Metronidazole] Anxiety  ? ? ?Patient Measurements: ?Height: '5\' 5"'$  (165.1 cm) ?Weight: 63.9 kg (140 lb 14 oz) ?IBW/kg (Calculated) : 57 ?Heparin Dosing Weight: 63.9 kg  ? ?Vital Signs: ?Temp: 98.1 ?F (36.7 ?C) (04/01 1422) ?Temp Source: Oral (04/01 1422) ?BP: 146/83 (04/01 1422) ?Pulse Rate: 78 (04/01 1422) ? ?Labs: ?Recent Labs  ?  09/08/21 ?0840 09/08/21 ?1556  ?HGB 16.5*  --   ?HCT 50.0*  --   ?PLT 265  --   ?APTT  --  24  ?HEPARINUNFRC  --  >1.10*  ?CREATININE 1.32*  --   ? ? ?Estimated Creatinine Clearance: 33.6 mL/min (A) (by C-G formula based on SCr of 1.32 mg/dL (H)). ? ? ?Medications:  ?Medications Prior to Admission  ?Medication Sig Dispense Refill Last Dose  ? acetaminophen (TYLENOL) 650 MG CR tablet Take 650 mg by mouth every 8 (eight) hours as needed for pain.   unknown  ? diltiazem (CARDIZEM SR) 120 MG 12 hr capsule Take 120 mg by mouth daily.   09/07/2021  ? ELIQUIS 5 MG TABS tablet TAKE 1 TABLET BY MOUTH TWICE DAILY 60 tablet 3 09/07/2021 at 1900  ? flecainide (TAMBOCOR) 100 MG tablet Take 100 mg by mouth 2 (two) times daily.   09/07/2021  ? metoprolol succinate (TOPROL-XL) 25 MG 24 hr tablet Take 25 mg by mouth daily.   09/07/2021 at 1900  ? metoprolol tartrate (LOPRESSOR) 25 MG tablet Take 0.5 tablets (12.5 mg total) by mouth 2 (two) times daily. (Patient taking differently: Take 12.5 mg by mouth daily as needed (afib).) 180 tablet 2 unknown  ? ondansetron (ZOFRAN-ODT) 4 MG disintegrating  tablet Take 4 mg by mouth 3 (three) times daily as needed for nausea or vomiting.   Past Week  ? vitamin B-12 (CYANOCOBALAMIN) 1000 MCG tablet Take 1,000 mcg by mouth daily.   Past Week  ? diltiazem (CARDIZEM CD) 120 MG 24 hr capsule TAKE 1 CAPSULE BY MOUTH EVERY DAY (Patient not taking: Reported on 09/08/2021) 60 capsule 0 Completed Course  ? ? ?Assessment: ?Pharmacy is consulted to dose heparin drip in 76 yo female with PMH of atrial fibrillation. Pt on Eliquis 5 mg BID with last dose per mar being on 3/31 at 1900. Pt being started on IV heparin as pt has been diagnosed with SBO.  ? ?Today, 09/08/21 ?HL > 1.10 indicating recent apixaban use  ?Baseline aPTT is 24 ?Hgb 16.5, plt 265  ?Scr 1.32 mg/dl, CrCl 34 ml/min  ?Will dose using aPTT until HL and aPTT start to correlate  ? ? ? ? ?Goal of Therapy:  ?Heparin level 0.3-0.7 units/ml ?aPTT 66-102  ?Monitor platelets by anticoagulation protocol: Yes ?  ?Plan:  ?Start heparin drip at 1000 units/hr  ?Obtain aPTT 8 hours after start of drip  ?Daily CBC  ?Monitor for signs and symptoms of bleeding ? ? ?Royetta Asal, PharmD, BCPS ?09/08/2021 4:57 PM ?  ? ? ?

## 2021-09-08 NOTE — Consult Note (Signed)
Surgical Evaluation ?Requesting provider: Dr. Cherylann Ratel ? ?Chief Complaint: bowel obstruction ? ?HPI: This is a very pleasant 76 year old woman with a history of atrial fibrillation on Eliquis and previous hysterectomy/appendectomy/umbilical hernia repair in 2001 via low midline incision and 2 previous small bowel obstructions requiring admission with (1 was a couple years ago in [place] state, the other was in 2014 here).  About 5 days ago she developed lower abdominal pressure and pain followed by intractable vomiting and p.o. intolerance.  This was similar to, but less intense, than her previous episodes of bowel obstruction.  She went to an urgent care 3 days ago and was given a prescription for Zofran which did not help, and was told to stay on a liquid diet and drink Pedialyte to hopefully get her through this without further admission.  Unfortunately she has continued to have intermittent nausea and vomiting, abdominal distention, and pain.  She does note that she has had ongoing intermittent flatus through this episode including today and her last bowel movement was 3 days ago. ?Currently she is experiencing some good relief after having an NG tube placed. ?Her husband points out that she has "food intolerances" and is down to a fairly narrow list of foods that she can tolerate, and she notes that her symptoms include crampy abdominal pain, diarrhea, indigestion.  High-fiber foods tend to trigger this. ? ? ?Allergies  ?Allergen Reactions  ? Lecithin Other (See Comments)  ?  Intestinal distress  ? Other   ?  Fluoroquinolones Antibiotics=increased heartbeat,fatigue,lack of focus   ? Potassium Nitrate Other (See Comments)  ?  & Potassium Nitrite=Increased heart rate,disorientation   ? Sulfa Antibiotics   ?  Joint pain  ? Sulfasalazine Other (See Comments)  ?  Joint pain  ? Ciprofloxacin Anxiety  ? Flagyl [Metronidazole] Anxiety  ? ? ?Past Medical History:  ?Diagnosis Date  ? Atrial fibrillation ([place])   ?  on Eliquis  ? Diverticulosis   ? Gout   ? Grade I diastolic dysfunction   ? Hyperlipidemia   ? Menopause   ? SBO (small bowel obstruction) ([place]) 11/06/2012  ? ? ?Past Surgical History:  ?Procedure Laterality Date  ? ABDOMINAL HYSTERECTOMY  2001  ? APPENDECTOMY  2001  ? CARDIOVERSION N/A 11/07/2017  ? Procedure: CARDIOVERSION;  Surgeon: Larey Dresser, MD;  Location: [facility] ENDOSCOPY;  Service: Cardiovascular;  Laterality: N/A;  ? CATARACT EXTRACTION Bilateral   ? TONSILLECTOMY  1957  ? UMBILICAL HERNIA REPAIR  2001  ? ? ?Family History  ?Problem Relation Age of Onset  ? Heart failure Mother   ? Diabetes Mother   ? Heart attack Father   ? Colon cancer Father 4  ? Colon cancer Paternal Uncle 37  ? Stomach cancer Cousin   ? ? ?Social History  ? ?Socioeconomic History  ? Marital status: Married  ?  Spouse name: Not on file  ? Number of children: Not on file  ? Years of education: Not on file  ? Highest education level: Not on file  ?Occupational History  ? Not on file  ?Tobacco Use  ? Smoking status: Former  ?  Packs/day: 1.00  ?  Years: 4.00  ?  Pack years: 4.00  ?  Types: Cigarettes  ?  Quit date: 06/10/1972  ?  Years since quitting: 49.2  ? Smokeless tobacco: Never  ?Vaping Use  ? Vaping Use: Never used  ?Substance and Sexual Activity  ? Alcohol use: Yes  ?  Comment: occasional-social  ?  Drug use: No  ? Sexual activity: Yes  ?Other Topics Concern  ? Not on file  ?Social History Narrative  ? Not on file  ? ?Social Determinants of Health  ? ?Financial Resource Strain: Not on file  ?Food Insecurity: Not on file  ?Transportation Needs: Not on file  ?Physical Activity: Not on file  ?Stress: Not on file  ?Social Connections: Not on file  ? ? ?No current facility-administered medications on file prior to encounter.  ? ?Current Outpatient Medications on File Prior to Encounter  ?Medication Sig Dispense Refill  ? diltiazem (CARDIZEM CD) 120 MG 24 hr capsule TAKE 1 CAPSULE BY MOUTH EVERY DAY 60 capsule 0  ? ELIQUIS 5 MG TABS tablet  TAKE 1 TABLET BY MOUTH TWICE DAILY 60 tablet 3  ? flecainide (TAMBOCOR) 100 MG tablet Take 100 mg by mouth 2 (two) times daily.    ? metoprolol tartrate (LOPRESSOR) 25 MG tablet Take 0.5 tablets (12.5 mg total) by mouth 2 (two) times daily. 180 tablet 2  ? Probiotic Product (PROBIOTIC DAILY PO) Take 1 capsule by mouth daily.     ? ? ?Review of Systems: a complete, 10pt review of systems was completed with pertinent positives and negatives as documented in the HPI ? ?Physical Exam: ?Vitals:  ? 09/08/21 1300 09/08/21 1422  ?BP: (!) 156/83 (!) 146/83  ?Pulse: 67 78  ?Resp: (!) 22 18  ?Temp:  98.1 ?F (36.7 ?C)  ?SpO2: (!) 83% 95%  ? ?Gen: A&Ox3, no distress  ?Eyes: lids and conjunctivae normal, no icterus. Pupils equally round and reactive to light.  ?Neck: supple without mass or thyromegaly ?Chest: respiratory effort is normal. No crepitus or tenderness on palpation of the chest. Breath sounds equal.  ?Cardiovascular: RRR with palpable distal pulses, no pedal edema ?Gastrointestinal: Soft, mildly distended, diffusely mildly tender without peritoneal signs.  Well-healed low midline laparotomy scar. ?Neuro: cranial nerves grossly intact.  Sensation intact to light touch diffusely. ?Psych: appropriate mood and affect, normal insight/judgment intact  ?Skin: warm and dry ? ? ? ?  Latest Ref Rng & Units 09/08/2021  ?  8:40 AM 11/14/2020  ?  5:47 PM 11/06/2017  ?  1:02 AM  ?CBC  ?WBC 4.0 - 10.5 K/uL 6.9   7.0   8.0    ?Hemoglobin 12.0 - 15.0 g/dL 16.5   12.3   14.9    ?Hematocrit 36.0 - 46.0 % 50.0   38.4   46.2    ?Platelets 150 - 400 K/uL 265   224   230    ? ? ? ?  Latest Ref Rng & Units 09/08/2021  ?  8:40 AM 11/14/2020  ?  5:47 PM 11/07/2017  ?  1:51 AM  ?CMP  ?Glucose 70 - 99 mg/dL 142   112   110    ?BUN 8 - 23 mg/dL 43   17   6    ?Creatinine 0.44 - 1.00 mg/dL 1.32   0.61   0.86    ?Sodium 135 - 145 mmol/L 134   139   141    ?Potassium 3.5 - 5.1 mmol/L 4.2   4.3   4.1    ?Chloride 98 - 111 mmol/L 92   103   106    ?CO2 22 - 32  mmol/L '25   28   28    '$ ?Calcium 8.9 - 10.3 mg/dL 10.8   9.3   9.2    ?Total Protein 6.5 - 8.1 g/dL 8.9      ?  Total Bilirubin 0.3 - 1.2 mg/dL 1.2      ?Alkaline Phos 38 - 126 U/L 71      ?AST 15 - 41 U/L 12      ?ALT 0 - 44 U/L 15      ? ? ?Lab Results  ?Component Value Date  ? INR 0.9 11/14/2020  ? ? ?Imaging: ?DG Abdomen 1 View ? ?Result Date: 09/08/2021 ?CLINICAL DATA:  NG tube placement EXAM: ABDOMEN - 1 VIEW COMPARISON:  CT done earlier today FINDINGS: Only the upper abdomen is included in the image. Tip of enteric tube is seen in the fundus of the stomach. Side-port in the enteric tube is noted in the lower thoracic esophagus. Visualized lung fields are clear. IMPRESSION: Tip of enteric tube is seen in the fundus of stomach. Side-port in the enteric tube is noted in the lower thoracic esophagus close to the gastroesophageal junction. Enteric tube could be advanced 5-10 cm to place the side port within the stomach. Electronically Signed   By: Elmer Picker M.D.   On: 09/08/2021 12:24  ? ?CT ABDOMEN PELVIS W CONTRAST ? ?Result Date: 09/08/2021 ?CLINICAL DATA:  Abdominal pain, vomiting EXAM: CT ABDOMEN AND PELVIS WITH CONTRAST TECHNIQUE: Multidetector CT imaging of the abdomen and pelvis was performed using the standard protocol following bolus administration of intravenous contrast. RADIATION DOSE REDUCTION: This exam was performed according to the departmental dose-optimization program which includes automated exposure control, adjustment of the mA and/or kV according to patient size and/or use of iterative reconstruction technique. CONTRAST:  25m OMNIPAQUE IOHEXOL 300 MG/ML  SOLN COMPARISON:  11/03/2012 FINDINGS: Lower chest: Unremarkable. Hepatobiliary: No focal abnormality is seen in the liver. There are multiple calcified gallbladder stones. Gallbladder is distended. There is no significant wall thickening in gallbladder. There is no fluid around the gallbladder. There is no dilation of bile ducts.  Pancreas: No focal abnormalities are seen. There is pancreatic atrophy. Spleen: Unremarkable. Adrenals/Urinary Tract: Adrenals are unremarkable. There is no hydronephrosis. There are no renal or ureteral st

## 2021-09-08 NOTE — ED Triage Notes (Signed)
N/v since Tuesday. Went to urgent care on Thursday, ordered zofran. Zofran did not help and now vomiting and constipated feeling in abdomen.  ?

## 2021-09-08 NOTE — Progress Notes (Signed)
Plan of Care Note for accepted transfer ? ? ?Patient: Kelly Walker MRN: [id number]   DOA: 09/08/2021 ? ?Facility requesting transfer: [facility] ?Requesting Provider: Dr. Jeanell Sparrow ?Reason for transfer: SBO ?Facility course: 76 yo F presenting with N/V/ab pain. W/u showed high grade SBO w/ transition point. EDP spoke with CCS. Agreed to see in consultation. NGT placed.  ? ?Plan of care: ?The patient is accepted for admission to Telemetry unit, at [facility]. ?While holding at [facility], medical decision making the patient remains the responsibility of the EDP. Upon arrival to [facility], [facility] was assume care. Thank you.  ? ?Author: ?Jonnie Finner, DO ?09/08/2021 ? ?Check [email or web address] for on-call coverage. ? ?Nursing staff, Please call [place] number on Amion as soon as patient's arrival, so appropriate admitting provider can evaluate the pt. ? ?

## 2021-09-08 NOTE — ED Notes (Signed)
Pt unable to urinate at this time, will try later after fluids.  ?

## 2021-09-08 NOTE — H&P (Signed)
?History and Physical  ? ? ?Patient: Kelly Walker ZOX:[id number] DOB: 1945/07/19 ?DOA: 09/08/2021 ?DOS: the patient was seen and examined on 09/08/2021 ?PCP: Jonathon Jordan, MD  ?Patient coming from: Home ? ?Chief Complaint:  ?Chief Complaint  ?Patient presents with  ? Nausea  ? ?HPI: Kelly Walker is a 76 y.o. female with medical history significant of a fib. Presenting with N/V and abdominal pain. Her symptoms started 4 days ago w/ N/V. This was both solids and liquids. She tried zofran and pedialyte, but these were not helpful. She had no contacts with similar symptoms. She noted a global crampy pressure. When her symptoms didn't improve this morning, she decided to come to the ED for assistance. She denies any other aggravating or alleviating factors.  ? ?Review of Systems: As mentioned in the history of present illness. All other systems reviewed and are negative. ?Past Medical History:  ?Diagnosis Date  ? Atrial fibrillation ([place])   ? on Eliquis  ? Diverticulosis   ? Gout   ? Grade I diastolic dysfunction   ? Hyperlipidemia   ? Menopause   ? SBO (small bowel obstruction) ([place]) 11/06/2012  ? ?Past Surgical History:  ?Procedure Laterality Date  ? ABDOMINAL HYSTERECTOMY  2001  ? APPENDECTOMY  2001  ? CARDIOVERSION N/A 11/07/2017  ? Procedure: CARDIOVERSION;  Surgeon: Larey Dresser, MD;  Location: [facility] ENDOSCOPY;  Service: Cardiovascular;  Laterality: N/A;  ? CATARACT EXTRACTION Bilateral   ? TONSILLECTOMY  1957  ? UMBILICAL HERNIA REPAIR  2001  ? ?Social History:  reports that she quit smoking about 49 years ago. She has a 4.00 pack-year smoking history. She has never used smokeless tobacco. She reports current alcohol use. She reports that she does not use drugs. ? ?Allergies  ?Allergen Reactions  ? Lecithin Other (See Comments)  ?  Intestinal distress  ? Other   ?  Fluoroquinolones Antibiotics=increased heartbeat,fatigue,lack of focus   ? Potassium Nitrate Other (See Comments)  ?  & Potassium Nitrite=Increased heart  rate,disorientation   ? Sulfa Antibiotics   ?  Joint pain  ? Sulfasalazine Other (See Comments)  ?  Joint pain  ? Ciprofloxacin Anxiety  ? Flagyl [Metronidazole] Anxiety  ? ? ?Family History  ?Problem Relation Age of Onset  ? Heart failure Mother   ? Diabetes Mother   ? Heart attack Father   ? Colon cancer Father 62  ? Colon cancer Paternal Uncle 43  ? Stomach cancer Cousin   ? ? ?Prior to Admission medications   ?Medication Sig Start Date End Date Taking? Authorizing Provider  ?diltiazem (CARDIZEM CD) 120 MG 24 hr capsule TAKE 1 CAPSULE BY MOUTH EVERY DAY 01/20/20  Yes [place], Casimiro Needle, RPH-CPP  ?ELIQUIS 5 MG TABS tablet TAKE 1 TABLET BY MOUTH TWICE DAILY 02/08/19  Yes Sherran Needs, NP  ?flecainide (TAMBOCOR) 100 MG tablet Take 100 mg by mouth 2 (two) times daily.   Yes [provider]  ?metoprolol tartrate (LOPRESSOR) 25 MG tablet Take 0.5 tablets (12.5 mg total) by mouth 2 (two) times daily. 11/11/17  Yes Sherran Needs, NP  ?Probiotic Product (PROBIOTIC DAILY PO) Take 1 capsule by mouth daily.     [provider]  ? ? ?Physical Exam: ?Vitals:  ? 09/08/21 1130 09/08/21 1200 09/08/21 1230 09/08/21 1300  ?BP: 134/80 133/82 119/76 (!) 156/83  ?Pulse: 78 71 71 67  ?Resp: '19 19 20 '$ (!) 22  ?Temp:      ?SpO2: 94% 98% 94% (!) 83%  ? ?  General: 76 y.o. female resting in bed in NAD ?Eyes: PERRL, normal sclera ?ENMT: Nares patent w/o discharge, orophaynx clear, dentition normal, ears w/o discharge/lesions/ulcers ?Neck: Supple, trachea midline ?Cardiovascular: RRR, +S1, S2, no m/g/r, equal pulses throughout ?Respiratory: CTABL, no w/r/r, normal WOB ?GI: BS+, ND, mild TTP RUQ, no masses noted, no organomegaly noted; NGT in place ?MSK: No e/c/c ?Neuro: A&O x 3, no focal deficits ?Psyc: Appropriate interaction and affect, calm/cooperative  ? ?Data Reviewed: ? ?Na+ 134 ?Cl- 92 ?Glucose  142 ?SCr  1.32 ? ?EKG: sinus, no st elevations ? ?CT ab/pelvis: High-grade partial small bowel obstruction with transition  point in the right lower quadrant of abdomen. Dilated small bowel loops measure up to 5 cm in diameter. This may be caused by adhesions or internal hernia. Distal and terminal ileum appear to be collapsed. There is trace amount of ascites in the pelvic cavity. There is no pneumoperitoneum. Surgical consultation should be considered.There is no hydronephrosis. Gallbladder stones. Diverticulosis of colon. Other findings as described in the body of the report. ? ?Assessment and Plan: ?No notes have been filed under this hospital service. ?Service: Hospitalist ?SBO ?    - admit to inpt, tele ?    - start SBO protocol: NGT in place, NPO, KUB ?    - CCS consulted; appreciate assistance ?    - compazine, pain control ?    - fluids ? ?AKI ?Dehydration ?    - fluids, no obstruction seen on CT, watch nephrotoxins ? ?Paroxysmal afib ?    - NPO for now ?    - start heparin gtt until she can resume eliquis ?    - will have PRN metoprolol onboard until she can resume her normal meds ? ?Hyperlgycemia ?    - check A1c ? ? Advance Care Planning:   Code Status: FULL  ? ?Consults: CCS ? ?Family Communication: w/ husband at bedside ? ?Severity of Illness: ?The appropriate patient status for this patient is INPATIENT. Inpatient status is judged to be reasonable and necessary in order to provide the required intensity of service to ensure the patient's safety. The patient's presenting symptoms, physical exam findings, and initial radiographic and laboratory data in the context of their chronic comorbidities is felt to place them at high risk for further clinical deterioration. Furthermore, it is not anticipated that the patient will be medically stable for discharge from the hospital within 2 midnights of admission.  ? ?* I certify that at the point of admission it is my clinical judgment that the patient will require inpatient hospital care spanning beyond 2 midnights from the point of admission due to high intensity of service, high  risk for further deterioration and high frequency of surveillance required.* ? ?Author: ?Jonnie Finner, DO ?09/08/2021 2:17 PM ? ?For on call review [email or web address].  ?

## 2021-09-08 NOTE — ED Notes (Signed)
Patient transported to CT 

## 2021-09-09 ENCOUNTER — Inpatient Hospital Stay (HOSPITAL_COMMUNITY): Payer: Medicare PPO

## 2021-09-09 DIAGNOSIS — R739 Hyperglycemia, unspecified: Secondary | ICD-10-CM

## 2021-09-09 DIAGNOSIS — N179 Acute kidney failure, unspecified: Secondary | ICD-10-CM | POA: Diagnosis not present

## 2021-09-09 DIAGNOSIS — I480 Paroxysmal atrial fibrillation: Secondary | ICD-10-CM | POA: Diagnosis not present

## 2021-09-09 DIAGNOSIS — K56609 Unspecified intestinal obstruction, unspecified as to partial versus complete obstruction: Secondary | ICD-10-CM | POA: Diagnosis not present

## 2021-09-09 LAB — CBC
HCT: 46.4 % — ABNORMAL HIGH (ref 36.0–46.0)
Hemoglobin: 15.3 g/dL — ABNORMAL HIGH (ref 12.0–15.0)
MCH: 28.7 pg (ref 26.0–34.0)
MCHC: 33 g/dL (ref 30.0–36.0)
MCV: 87.1 fL (ref 80.0–100.0)
Platelets: 237 10*3/uL (ref 150–400)
RBC: 5.33 MIL/uL — ABNORMAL HIGH (ref 3.87–5.11)
RDW: 12.4 % (ref 11.5–15.5)
WBC: 6.7 10*3/uL (ref 4.0–10.5)
nRBC: 0 % (ref 0.0–0.2)

## 2021-09-09 LAB — COMPREHENSIVE METABOLIC PANEL
ALT: 19 U/L (ref 0–44)
AST: 13 U/L — ABNORMAL LOW (ref 15–41)
Albumin: 3.9 g/dL (ref 3.5–5.0)
Alkaline Phosphatase: 68 U/L (ref 38–126)
Anion gap: 13 (ref 5–15)
BUN: 42 mg/dL — ABNORMAL HIGH (ref 8–23)
CO2: 24 mmol/L (ref 22–32)
Calcium: 9.3 mg/dL (ref 8.9–10.3)
Chloride: 101 mmol/L (ref 98–111)
Creatinine, Ser: 0.93 mg/dL (ref 0.44–1.00)
GFR, Estimated: >60 mL/min (ref >60)
Glucose, Bld: 120 mg/dL — ABNORMAL HIGH (ref 70–99)
Potassium: 3.4 mmol/L — ABNORMAL LOW (ref 3.5–5.1)
Sodium: 138 mmol/L (ref 135–145)
Total Bilirubin: 0.9 mg/dL (ref 0.3–1.2)
Total Protein: 7.5 g/dL (ref 6.5–8.1)

## 2021-09-09 LAB — APTT
aPTT: 38 seconds — ABNORMAL HIGH (ref 24–36)
aPTT: 48 seconds — ABNORMAL HIGH (ref 24–36)
aPTT: 82 seconds — ABNORMAL HIGH (ref 24–36)

## 2021-09-09 LAB — HEMOGLOBIN A1C
Hgb A1c MFr Bld: 5.6 % (ref 4.8–5.6)
Mean Plasma Glucose: 114.02 mg/dL

## 2021-09-09 LAB — HEPARIN LEVEL (UNFRACTIONATED): Heparin Unfractionated: >1.1 IU/mL — ABNORMAL HIGH (ref 0.30–0.70)

## 2021-09-09 MED ORDER — POTASSIUM CHLORIDE 10 MEQ/100ML IV SOLN
10.0000 meq | INTRAVENOUS | Status: AC
  Administered 2021-09-09 (×6): 10 meq via INTRAVENOUS
  Filled 2021-09-09 (×2): qty 100

## 2021-09-09 MED ORDER — HEPARIN BOLUS VIA INFUSION
2000.0000 [IU] | Freq: Once | INTRAVENOUS | Status: AC
  Administered 2021-09-09: 2000 [IU] via INTRAVENOUS
  Filled 2021-09-09: qty 2000

## 2021-09-09 MED ORDER — METOPROLOL TARTRATE 5 MG/5ML IV SOLN
2.5000 mg | Freq: Three times a day (TID) | INTRAVENOUS | Status: DC
  Administered 2021-09-09 – 2021-09-12 (×10): 2.5 mg via INTRAVENOUS
  Filled 2021-09-09 (×10): qty 5

## 2021-09-09 MED ORDER — PANTOPRAZOLE SODIUM 40 MG IV SOLR
40.0000 mg | INTRAVENOUS | Status: DC
  Administered 2021-09-09 – 2021-09-12 (×4): 40 mg via INTRAVENOUS
  Filled 2021-09-09 (×3): qty 10

## 2021-09-09 MED ORDER — HEPARIN (PORCINE) 25000 UT/250ML-% IV SOLN
1200.0000 [IU]/h | INTRAVENOUS | Status: DC
  Administered 2021-09-09 – 2021-09-10 (×3): 1200 [IU]/h via INTRAVENOUS
  Filled 2021-09-09 (×3): qty 250

## 2021-09-09 MED ORDER — ONDANSETRON HCL 4 MG/2ML IJ SOLN: 4.0000 mg | Freq: Four times a day (QID) | INTRAMUSCULAR | Status: DC | PRN

## 2021-09-09 NOTE — Progress Notes (Signed)
? ?Subjective/Chief Complaint: ?No complaints. Feels a little better. No flatus ? ? ?Objective: ?Vital signs in last 24 hours: ?Temp:  [98 ?F (36.7 ?C)-98.6 ?F (37 ?C)] 98 ?F (36.7 ?C) (04/02 0631) ?Pulse Rate:  [64-83] 81 (04/02 0631) ?Resp:  [17-22] 18 (04/02 0631) ?BP: (118-156)/(74-96) 140/74 (04/02 0631) ?SpO2:  [83 %-100 %] 94 % (04/02 0631) ?Weight:  [63.9 kg] 63.9 kg (04/01 1422) ?Last BM Date : 09/06/21 ? ?Intake/Output from previous day: ?04/01 0701 - 04/02 0700 ?In: 1367.1 [I.V.:1367.1] ?Out: 1920 [Urine:570; Emesis/NG output:1350] ?Intake/Output this shift: ?No intake/output data recorded. ? ?General appearance: alert and cooperative ?Resp: clear to auscultation bilaterally ?Cardio: regular rate and rhythm ?GI: soft, nontender. Mild distension. Ng in place ? ?Lab Results:  ?Recent Labs  ?  09/08/21 ?0840 09/09/21 ?0154  ?WBC 6.9 6.7  ?HGB 16.5* 15.3*  ?HCT 50.0* 46.4*  ?PLT 265 237  ? ?BMET ?Recent Labs  ?  09/08/21 ?0840 09/09/21 ?0154  ?NA 134* 138  ?K 4.2 3.4*  ?CL 92* 101  ?CO2 25 24  ?GLUCOSE 142* 120*  ?BUN 43* 42*  ?CREATININE 1.32* 0.93  ?CALCIUM 10.8* 9.3  ? ?PT/INR ?No results for input(s): LABPROT, INR in the last 72 hours. ?ABG ?No results for input(s): PHART, HCO3 in the last 72 hours. ? ?Invalid input(s): PCO2, PO2 ? ?Studies/Results: ?DG Abdomen 1 View ? ?Result Date: 09/08/2021 ?CLINICAL DATA:  NG tube placement EXAM: ABDOMEN - 1 VIEW COMPARISON:  CT done earlier today FINDINGS: Only the upper abdomen is included in the image. Tip of enteric tube is seen in the fundus of the stomach. Side-port in the enteric tube is noted in the lower thoracic esophagus. Visualized lung fields are clear. IMPRESSION: Tip of enteric tube is seen in the fundus of stomach. Side-port in the enteric tube is noted in the lower thoracic esophagus close to the gastroesophageal junction. Enteric tube could be advanced 5-10 cm to place the side port within the stomach. Electronically Signed   By: Elmer Picker M.D.   On: 09/08/2021 12:24  ? ?CT ABDOMEN PELVIS W CONTRAST ? ?Result Date: 09/08/2021 ?CLINICAL DATA:  Abdominal pain, vomiting EXAM: CT ABDOMEN AND PELVIS WITH CONTRAST TECHNIQUE: Multidetector CT imaging of the abdomen and pelvis was performed using the standard protocol following bolus administration of intravenous contrast. RADIATION DOSE REDUCTION: This exam was performed according to the departmental dose-optimization program which includes automated exposure control, adjustment of the mA and/or kV according to patient size and/or use of iterative reconstruction technique. CONTRAST:  41m OMNIPAQUE IOHEXOL 300 MG/ML  SOLN COMPARISON:  11/03/2012 FINDINGS: Lower chest: Unremarkable. Hepatobiliary: No focal abnormality is seen in the liver. There are multiple calcified gallbladder stones. Gallbladder is distended. There is no significant wall thickening in gallbladder. There is no fluid around the gallbladder. There is no dilation of bile ducts. Pancreas: No focal abnormalities are seen. There is pancreatic atrophy. Spleen: Unremarkable. Adrenals/Urinary Tract: Adrenals are unremarkable. There is no hydronephrosis. There are no renal or ureteral stones. Urinary bladder is not distended. Stomach/Bowel: Stomach is unremarkable. There is marked abnormal dilation of proximal small bowel loops measuring up to 5 cm in maximum diameter. There is decompression of distal small bowel loops. Exact level of transition in the caliber is not clearly identified. It is possibly in the right lower quadrant. Degree of small-bowel dilation is worse in comparison to the previous study. Appendix is not distinctly seen. There is no focal pericecal inflammation. There is no significant wall thickening in  colon. Scattered diverticula are seen in colon without signs of focal diverticulitis. Vascular/Lymphatic: There are scattered arterial calcifications. Reproductive: Uterus is not seen. Other: Trace amount of ascites is  seen in the pelvis. There is no pneumoperitoneum. There is small ventral hernia containing fat slightly above the level of umbilicus. Small left inguinal hernia containing fat is seen. Musculoskeletal: Degenerative changes are noted in the lumbar spine, particularly severe at L5-S1 level with encroachment of neural foramina. IMPRESSION: High-grade partial small bowel obstruction with transition point in the right lower quadrant of abdomen. Dilated small bowel loops measure up to 5 cm in diameter. This may be caused by adhesions or internal hernia. Distal and terminal ileum appear to be collapsed. There is trace amount of ascites in the pelvic cavity. There is no pneumoperitoneum. Surgical consultation should be considered. There is no hydronephrosis. Gallbladder stones. Diverticulosis of colon. Other findings as described in the body of the report. Electronically Signed   By: Elmer Picker M.D.   On: 09/08/2021 10:50  ? ?DG Abd Portable 1V-Small Bowel Obstruction Protocol-initial, 8 hr delay ? ?Result Date: 09/09/2021 ?CLINICAL DATA:  Small bowel obstruction.  8 hour delay. EXAM: PORTABLE ABDOMEN - 1 VIEW COMPARISON:  Abdomen 09/08/2021, CT 09/08/2021 FINDINGS: No significant bowel contrast material is demonstrated. A few gas distended small bowel loops are demonstrated in the left upper quadrant and left pelvis. This is consistent with obstruction as seen on the previous CT. Scattered gas in the colon. Residual contrast material in the bladder. An enteric tube is present in the left upper quadrant consistent with location in the upper stomach. Degenerative changes in the spine and hips. IMPRESSION: 1. No bowel contrast material is demonstrated. 2. Scattered gas-filled dilated small bowel loops consistent with history of small-bowel obstruction. 3. Enteric tube tip is in the left upper quadrant consistent with location in the stomach. 4. Residual contrast material is seen in the bladder. Electronically Signed    By: Lucienne Capers M.D.   On: 09/09/2021 01:01   ? ?Anti-infectives: ?Anti-infectives (From admission, onward)  ? ? None  ? ?  ? ? ?Assessment/Plan: ?s/p * No surgery found * ?Continue ng and bowel rest ?Sbo. Small bowel protocol ?Afib. Hold eliquis. On heparin drip ?Will try to substitute rate controlling meds with IV forms ?Ambulate ?Will follow ? LOS: 1 day  ? ? ?Kelly Walker ?09/09/2021 ? ?

## 2021-09-09 NOTE — Progress Notes (Signed)
ANTICOAGULATION CONSULT NOTE - Follow Up Consult ? ?Pharmacy Consult for IV heparin ?Indication: atrial fibrillation ? ?Allergies  ?Allergen Reactions  ? Lecithin Other (See Comments)  ?  Intestinal distress  ? Other   ?  Fluoroquinolones Antibiotics=increased heartbeat,fatigue,lack of focus   ? Potassium Nitrate Other (See Comments)  ?  & Potassium Nitrite=Increased heart rate,disorientation   ? Sulfa Antibiotics   ?  Joint pain  ? Sulfasalazine Other (See Comments)  ?  Joint pain  ? Ciprofloxacin Anxiety  ? Flagyl [Metronidazole] Anxiety  ? ? ?Patient Measurements: ?Height: '5\' 5"'$  (165.1 cm) ?Weight: 63.9 kg (140 lb 14 oz) ?IBW/kg (Calculated) : 57 ?Heparin Dosing Weight: 63.9 kg  ? ?Vital Signs: ?Temp: 98 ?F (36.7 ?C) (04/02 0631) ?Temp Source: Oral (04/02 0631) ?BP: 145/79 (04/02 0934) ?Pulse Rate: 74 (04/02 0934) ? ?Labs: ?Recent Labs  ?  09/08/21 ?0840 09/08/21 ?1556 09/09/21 ?0154 09/09/21 ?0758  ?HGB 16.5*  --  15.3*  --   ?HCT 50.0*  --  46.4*  --   ?PLT 265  --  237  --   ?APTT  --  24 38* 48*  ?HEPARINUNFRC  --  >1.10* >1.10*  --   ?CREATININE 1.32*  --  0.93  --   ? ? ? ?Estimated Creatinine Clearance: 47.8 mL/min (by C-G formula based on SCr of 0.93 mg/dL). ? ?Assessment: ?Pharmacy is consulted to dose heparin drip in 76 yo female with PMH of atrial fibrillation. Pt on Eliquis 5 mg BID with last dose per mar being on 3/31 at 1900. Pt being started on IV heparin as pt has been diagnosed with SBO.  ? ?Today, 09/09/21 ?HL > 1.10 indicating recent apixaban use.  Dose using aPTT until HL and aPTT start to correlate ?aPTT is 48- subtherapeutic on IV heparin 1000 units/hr ?CBC- Hg/pltc WNL ?Night shift RN reported poor initial IV site that was frequently occluded.  IV site changed ~ midnight. Day shift RN reports heparin infusion with no problems.  ?No bleeding per RN report ? ?Goal of Therapy:  ?Heparin level 0.3-0.7 units/ml ?aPTT 66-102  ?Monitor platelets by anticoagulation protocol: Yes ?  ?Plan:  ?Heparin  2000 unit IV bolus ?increase heparin infusion to 1200 units/hr ?Check aPTT 8 hrs after rate increase ?Daily CBC, daily aPTT & heparin level ?Monitor for signs and symptoms of bleeding ? ?Eudelia Bunch, Pharm.D ?09/09/2021 10:57 AM ? ?

## 2021-09-09 NOTE — Progress Notes (Signed)
?Progress Note ? ? ?Patient: Kelly Walker UXL:[id number] DOB: 07-22-1945 DOA: 09/08/2021     1 ?DOS: the patient was seen and examined on 09/09/2021 ?  ?Brief hospital course: ?Kelly Walker was admitted to the hospital with the working diagnosis of small bowel obstruction.  ? ?76 yo female with the past medical history of atrial fibrillation, who presented with abdominal pain, nausea and vomiting for 4 days. Her symptoms were refractive to oral antiemetics at home and she decided to come to the ED for further evaluation. On her initial physical examination her blood pressure was 134/80, HR 78, RR 19 and 02 saturation 94%, her lungs were clear to auscultation, heart with S1 and S2 present, abdomen with tender to palpation at the right upper quadrant, with no distention, no lower extremity edema.  ? ?Na 134, K 4,2, CL 92, bicarbonate 25, glucose 142, bun 43 cr 1,3 ?AST 12, ALT 15, lipase 26  ?Wbc 6,9, hgb 16.5 hct 50, plt 265  ? ?CT abdomen and pelvis with high grade partial small bowel obstruction with transition point in the right lower quadrant of the abdomen. Dilated small bowel loops measure up to 5 cm in diameter.  ?Gallstones and colonic diverticulosis.  ? ?EKG 75 bpm, normal axis, normal intervals, sinus rhythm, with J point elevation V2 to V6 with no significant T wave changes, positive LVH.  ? ?Patient had NG tube placed for decompression with good toleration.  ?Surgery was consulted with recommendations for conservative medical care.  ? ? ?Assessment and Plan: ?* SBO (small bowel obstruction) ([place]) ?Patient with no abdominal pain, NG tube is in place and positive flatus today. ?NG tube output is 1,350 ml over last 24 hrs.  ? ?Plan to continue conservative care with IV fluids and antiacids.  ?As needed analgesics and antiemetics.  ?Follow up with surgery recommendations.  ? ?Paroxysmal atrial fibrillation ([place]) ?Patient continue sinus rhythm, continue anticoagulation with heparin. ?Telemetry personally reviewed  with rate of 70 bpm. ?Continue with metoprolol IV 2,5 mg q 8 hrs and as needed 5 mg in case of RVR.  ? ?At home patient on metoprolol succinate, flecainide, and diltiazem.  ? ?AKI (acute kidney injury) ([place]) ?Hypokalemia.  ?Renal function with serum cr at 0,93 with K at 3,4 and serum bicarbonate at 24. ?Plan to continue isotonic saline at 75 ml per hr. ?Add KCL 60 meq IV and follow up renal function and electrolytes in am, avoid hypotension or nephrotoxic medications.  ? ?Hyperglycemia ?Fasting glucose this am is 120 mg/dl  ?Hgb A1c in 2019 in the prediabetic range at 5,9.  ?Continue close glucose monitoring, hold on insulin therapy for now.  ? ? ? ? ?  ? ?Subjective: patient is feeling better, no nausea or vomiting, no abdominal pain, positive flatus  ? ?Physical Exam: ?Vitals:  ? 09/09/21 5366 09/09/21 0228 09/09/21 0631 09/09/21 0934  ?BP: (!) 150/91  140/74 (!) 145/79  ?Pulse: 83 81 81 74  ?Resp: 20  18   ?Temp: 98.2 ?F (36.8 ?C)  98 ?F (36.7 ?C)   ?TempSrc: Oral  Oral   ?SpO2: (!) 89% 95% 94%   ?Weight:      ?Height:      ? ?Neurology awake and alert ?ENT  with NG in place ?Cardiovascular with S1 and S2 present and rhythmic with no gallops, rubs or murmurs ?Respiratory with no rales or wheezing ?Abdomen soft and not distended ?No lower extremity edema  ?Data Reviewed: ? ? ? ?Family Communication: I spoke with  patient's family at the bedside, we talked in detail about patient's condition, plan of care and prognosis and all questions were addressed. ? ? ?Disposition: ?Status is: Inpatient ?Remains inpatient appropriate because: small bowel obstruction  ? Planned Discharge Destination: Home ? ? ? ? ? ?Author: ?Tawni Millers, MD ?09/09/2021 1:31 PM ? ?For on call review [email or web address].  ?

## 2021-09-09 NOTE — Assessment & Plan Note (Addendum)
Resume metoprolol succinate, flecainide, and diltiazem.  ?Will resume eliquis ?She mentions concern given her HR -> she was told HR increased yesterday, she notes she was active during that time.  Does have documented RVR at times, but reportedly active, rate in 80's-90's while she's at rest during my evaluation.  Had her walk prior to discharge, rates increased to 110's-130's (max 140's), she was asymptomatic.  I think ok to continue current regimen and follow outpatient. ?

## 2021-09-09 NOTE — Progress Notes (Signed)
ANTICOAGULATION CONSULT NOTE - Follow Up Consult ? ?Pharmacy Consult for IV heparin ?Indication: atrial fibrillation ? ?Allergies  ?Allergen Reactions  ? Lecithin Other (See Comments)  ?  Intestinal distress  ? Other   ?  Fluoroquinolones Antibiotics=increased heartbeat,fatigue,lack of focus   ? Potassium Nitrate Other (See Comments)  ?  & Potassium Nitrite=Increased heart rate,disorientation   ? Sulfa Antibiotics   ?  Joint pain  ? Sulfasalazine Other (See Comments)  ?  Joint pain  ? Ciprofloxacin Anxiety  ? Flagyl [Metronidazole] Anxiety  ? ? ?Patient Measurements: ?Height: '5\' 5"'$  (165.1 cm) ?Weight: 63.9 kg (140 lb 14 oz) ?IBW/kg (Calculated) : 57 ?Heparin Dosing Weight: 63.9 kg  ? ?Vital Signs: ?Temp: 98.4 ?F (36.9 ?C) (04/02 2007) ?Temp Source: Oral (04/02 2007) ?BP: 132/76 (04/02 2007) ?Pulse Rate: 77 (04/02 2007) ? ?Labs: ?Recent Labs  ?  09/08/21 ?0840 09/08/21 ?1556 09/08/21 ?1556 09/09/21 ?0154 09/09/21 ?0758 09/09/21 ?1917  ?HGB 16.5*  --   --  15.3*  --   --   ?HCT 50.0*  --   --  46.4*  --   --   ?PLT 265  --   --  237  --   --   ?APTT  --  24   < > 38* 48* 82*  ?HEPARINUNFRC  --  >1.10*  --  >1.10*  --   --   ?CREATININE 1.32*  --   --  0.93  --   --   ? < > = values in this interval not displayed.  ? ? ? ?Estimated Creatinine Clearance: 47.8 mL/min (by C-G formula based on SCr of 0.93 mg/dL). ? ?Assessment: ?Pharmacy is consulted to dose heparin drip in 76 yo female with PMH of atrial fibrillation. Pt on Eliquis 5 mg BID with last dose per mar being on 3/31 at 1900. Pt being started on IV heparin as pt has been diagnosed with SBO.  ? ?Today, 09/09/21 ?Heparin level > 1.1 units/mL, indicating recent apixaban use.  Dosing heparin using aPTT until heparin level and aPTT correlate.  ?PM aPTT = 82 seconds, now therapeutic on IV heparin at 1200 units/hr ?CBC: Hgb 15.3, Pltc WNL ?No bleeding or infusion issues noted per nursing ? ?Goal of Therapy:  ?Heparin level 0.3-0.7 units/ml ?aPTT 66-102 seconds ?Monitor  platelets by anticoagulation protocol: Yes ?  ?Plan:  ?Continue heparin infusion at 1200 units/hr ?Daily CBC, aPTT, heparin level ?Monitor for signs and symptoms of bleeding ? ? ?Lindell Spar, PharmD, BCPS ?Clinical Pharmacist  ?09/09/2021 8:17 PM ? ?

## 2021-09-09 NOTE — Assessment & Plan Note (Addendum)
resolved 

## 2021-09-09 NOTE — Progress Notes (Signed)
ANTICOAGULATION CONSULT NOTE - Follow Up Consult ? ?Pharmacy Consult for IV heparin ?Indication: atrial fibrillation ? ?Allergies  ?Allergen Reactions  ? Lecithin Other (See Comments)  ?  Intestinal distress  ? Other   ?  Fluoroquinolones Antibiotics=increased heartbeat,fatigue,lack of focus   ? Potassium Nitrate Other (See Comments)  ?  & Potassium Nitrite=Increased heart rate,disorientation   ? Sulfa Antibiotics   ?  Joint pain  ? Sulfasalazine Other (See Comments)  ?  Joint pain  ? Ciprofloxacin Anxiety  ? Flagyl [Metronidazole] Anxiety  ? ? ?Patient Measurements: ?Height: '5\' 5"'$  (165.1 cm) ?Weight: 63.9 kg (140 lb 14 oz) ?IBW/kg (Calculated) : 57 ?Heparin Dosing Weight: 63.9 kg  ? ?Vital Signs: ?Temp: 98.2 ?F (36.8 ?C) (04/02 0223) ?Temp Source: Oral (04/02 0223) ?BP: 150/91 (04/02 0223) ?Pulse Rate: 81 (04/02 0228) ? ?Labs: ?Recent Labs  ?  09/08/21 ?0840 09/08/21 ?1556 09/09/21 ?0154  ?HGB 16.5*  --  15.3*  ?HCT 50.0*  --  46.4*  ?PLT 265  --  237  ?APTT  --  24 38*  ?HEPARINUNFRC  --  >1.10* >1.10*  ?CREATININE 1.32*  --   --   ? ? ? ?Estimated Creatinine Clearance: 33.6 mL/min (A) (by C-G formula based on SCr of 1.32 mg/dL (H)). ? ? ?Medications:  ?Medications Prior to Admission  ?Medication Sig Dispense Refill Last Dose  ? acetaminophen (TYLENOL) 650 MG CR tablet Take 650 mg by mouth every 8 (eight) hours as needed for pain.   unknown  ? diltiazem (CARDIZEM SR) 120 MG 12 hr capsule Take 120 mg by mouth daily.   09/07/2021  ? ELIQUIS 5 MG TABS tablet TAKE 1 TABLET BY MOUTH TWICE DAILY 60 tablet 3 09/07/2021 at 1900  ? flecainide (TAMBOCOR) 100 MG tablet Take 100 mg by mouth 2 (two) times daily.   09/07/2021  ? metoprolol succinate (TOPROL-XL) 25 MG 24 hr tablet Take 25 mg by mouth daily.   09/07/2021 at 1900  ? metoprolol tartrate (LOPRESSOR) 25 MG tablet Take 0.5 tablets (12.5 mg total) by mouth 2 (two) times daily. (Patient taking differently: Take 12.5 mg by mouth daily as needed (afib).) 180 tablet 2 unknown   ? ondansetron (ZOFRAN-ODT) 4 MG disintegrating tablet Take 4 mg by mouth 3 (three) times daily as needed for nausea or vomiting.   Past Week  ? vitamin B-12 (CYANOCOBALAMIN) 1000 MCG tablet Take 1,000 mcg by mouth daily.   Past Week  ? diltiazem (CARDIZEM CD) 120 MG 24 hr capsule TAKE 1 CAPSULE BY MOUTH EVERY DAY (Patient not taking: Reported on 09/08/2021) 60 capsule 0 Completed Course  ? ? ?Assessment: ?Pharmacy is consulted to dose heparin drip in 76 yo female with PMH of atrial fibrillation. Pt on Eliquis 5 mg BID with last dose per mar being on 3/31 at 1900. Pt being started on IV heparin as pt has been diagnosed with SBO.  ? ?Today, 09/09/21 ?HL > 1.10 indicating recent apixaban use.  Dose using aPTT until HL and aPTT start to correlate ?aPTT is 38- subtherapeutic on IV heparin 1000 units/hr ?CBC- Hg/pltc WNL ? No bleeding noted.   ?RN reports poor initial IV site that was frequently occluded.  IV site changed ~ midnight.  Uncertain if, how much IV heparin patient received prior to IV site change.  ? ?Goal of Therapy:  ?Heparin level 0.3-0.7 units/ml ?aPTT 66-102  ?Monitor platelets by anticoagulation protocol: Yes ?  ?Plan:  ?Continue heparin infusion at 1000 units/hr ?Check aPTT at 8a  ?  Daily CBC  ?Monitor for signs and symptoms of bleeding ? ?Netta Cedars, PharmD, BCPS ?09/09/2021 2:38 AM ?  ? ? ?

## 2021-09-09 NOTE — Hospital Course (Addendum)
Kelly Walker was admitted to the hospital with the working diagnosis of small bowel obstruction.  ? ?76 yo female with the past medical history of atrial fibrillation, who presented with abdominal pain, nausea and vomiting for 4 days. Kelly Walker CT abdomen and pelvis with high grade partial small bowel obstruction with transition point in the right lower quadrant of the abdomen. Surgery was c/s and NG tube was placed.  She improved with conservative measures.   ? ?See below for additional details ? ?

## 2021-09-09 NOTE — TOC Initial Note (Signed)
Transition of Care (TOC) - Initial/Assessment Note  ? ? ?Patient Details  ?Name: Kelly Walker ?MRN: [id number] ?Date of Birth: 02-27-1946 ? ?Transition of Care (TOC) CM/SW Contact:    ?Tawanna Cooler, RN ?Phone Number: ?09/09/2021, 4:18 PM ? ?Clinical Narrative:                 ? ?Transition of Care Department [facility]) has reviewed patient and no TOC needs have been identified at this time. We will continue to monitor patient advancement through interdisciplinary progression rounds. If new patient transition needs arise, please place a TOC consult. ? ?Expected Discharge Plan: Home/Self Care ?Barriers to Discharge: Continued Medical Work up ? ? ?Expected Discharge Plan and Services ?Expected Discharge Plan: Home/Self Care ?  ?  ?Living arrangements for the past 2 months: [place] ?   ?Prior Living Arrangements/Services ?Living arrangements for the past 2 months: [place] ?Lives with:: Spouse ?Patient language and need for interpreter reviewed:: Yes ?       ?Need for Family Participation in Patient Care: Yes (Comment) ?Care giver support system in place?: Yes (comment) ?  ?Criminal Activity/Legal Involvement Pertinent to Current Situation/Hospitalization: No - Comment as needed ? ?Activities of Daily Living ?Home Assistive Devices/Equipment: Eyeglasses ?ADL Screening (condition at time of admission) ?Patient's cognitive ability adequate to safely complete daily activities?: Yes ?Is the patient deaf or have difficulty hearing?: No ?Does the patient have difficulty seeing, even when wearing glasses/contacts?: No ?Does the patient have difficulty concentrating, remembering, or making decisions?: No ?Patient able to express need for assistance with ADLs?: Yes ?Does the patient have difficulty dressing or bathing?: Yes ?Independently performs ADLs?: Yes (appropriate for developmental age) ?Does the patient have difficulty walking or climbing stairs?: Yes ?Weakness of Legs: Both ?Weakness of Arms/Hands:  None ? ?Emotional Assessment ?  ?Orientation: : Oriented to Self, Oriented to Place, Oriented to  Time, Oriented to Situation ?Alcohol / Substance Use: Not Applicable ?Psych Involvement: No (comment) ? ?Admission diagnosis:  SBO (small bowel obstruction) ([place]) [K56.609] ?Patient Active Problem List  ? Diagnosis Date Noted  ? Hyperglycemia 09/08/2021  ? AKI (acute kidney injury) ([place]) 09/08/2021  ? Atrial fibrillation with RVR ([place]) 11/06/2017  ? HLD (hyperlipidemia) 11/06/2017  ? Paroxysmal atrial fibrillation ([place]) 10/23/2017  ? Pain in finger of left hand 09/20/2016  ? Trigger finger, left ring finger 09/20/2016  ? DJD (degenerative joint disease), multiple sites 03/20/2014  ? History of bone density study 04/19/2013  ? Estrogen deficiency 02/18/2013  ? Elevated uric acid in blood 02/18/2013  ? Personal history of colonic polyps 02/18/2013  ? SBO (small bowel obstruction) ([place]) 11/06/2012  ? DJD (degenerative joint disease) 08/06/2011  ? Obesity 08/06/2011  ? Family history of colon cancer requiring screening colonoscopy 08/06/2011  ? Elevated blood pressure 07/24/2011  ? Hyperlipidemia   ? Menopause   ? Diverticulosis   ? ?PCP:  Jonathon Jordan, MD ?Pharmacy:   ?[id number] - [place], [place] - [address] ?[address] ?[place] [address] ?Phone: [phone] Fax: [phone] ? ? ?

## 2021-09-09 NOTE — Assessment & Plan Note (Addendum)
Follow a1c - pending at time of discharge ?

## 2021-09-09 NOTE — Assessment & Plan Note (Addendum)
CT 4/1 with high grade partial SBO with transition points in the RLQ of abdomen, dilated small bowel loops measure up to 5 cm in diameter ?KUB 4/3 with partial distal small bowel obstruction, contrast opacifies multiple dilated loops of bowel within RLQ as well as nondilated ascending and proximal transverse colon ?NG tube removed 4/5 per surgery ?Appreciate surgery recs, ok for discharge today, follow outpatient ? ?

## 2021-09-10 ENCOUNTER — Inpatient Hospital Stay (HOSPITAL_COMMUNITY): Payer: Medicare PPO

## 2021-09-10 DIAGNOSIS — N179 Acute kidney failure, unspecified: Secondary | ICD-10-CM | POA: Diagnosis not present

## 2021-09-10 DIAGNOSIS — E876 Hypokalemia: Secondary | ICD-10-CM | POA: Diagnosis not present

## 2021-09-10 DIAGNOSIS — R739 Hyperglycemia, unspecified: Secondary | ICD-10-CM | POA: Diagnosis not present

## 2021-09-10 DIAGNOSIS — K56609 Unspecified intestinal obstruction, unspecified as to partial versus complete obstruction: Secondary | ICD-10-CM | POA: Diagnosis not present

## 2021-09-10 LAB — CBC
HCT: 46.9 % — ABNORMAL HIGH (ref 36.0–46.0)
Hemoglobin: 15.1 g/dL — ABNORMAL HIGH (ref 12.0–15.0)
MCH: 29.1 pg (ref 26.0–34.0)
MCHC: 32.2 g/dL (ref 30.0–36.0)
MCV: 90.4 fL (ref 80.0–100.0)
Platelets: 234 10*3/uL (ref 150–400)
RBC: 5.19 MIL/uL — ABNORMAL HIGH (ref 3.87–5.11)
RDW: 12.6 % (ref 11.5–15.5)
WBC: 8.7 10*3/uL (ref 4.0–10.5)
nRBC: 0 % (ref 0.0–0.2)

## 2021-09-10 LAB — BASIC METABOLIC PANEL
Anion gap: 11 (ref 5–15)
BUN: 28 mg/dL — ABNORMAL HIGH (ref 8–23)
CO2: 19 mmol/L — ABNORMAL LOW (ref 22–32)
Calcium: 8.6 mg/dL — ABNORMAL LOW (ref 8.9–10.3)
Chloride: 109 mmol/L (ref 98–111)
Creatinine, Ser: 0.71 mg/dL (ref 0.44–1.00)
GFR, Estimated: >60 mL/min (ref >60)
Glucose, Bld: 101 mg/dL — ABNORMAL HIGH (ref 70–99)
Potassium: 4.1 mmol/L (ref 3.5–5.1)
Sodium: 139 mmol/L (ref 135–145)

## 2021-09-10 LAB — APTT: aPTT: 86 seconds — ABNORMAL HIGH (ref 24–36)

## 2021-09-10 LAB — HEPARIN LEVEL (UNFRACTIONATED): Heparin Unfractionated: 0.95 IU/mL — ABNORMAL HIGH (ref 0.30–0.70)

## 2021-09-10 LAB — MAGNESIUM: Magnesium: 2.2 mg/dL (ref 1.7–2.4)

## 2021-09-10 MED ORDER — DIATRIZOATE MEGLUMINE & SODIUM 66-10 % PO SOLN
90.0000 mL | Freq: Once | ORAL | Status: AC
  Administered 2021-09-10: 90 mL via NASOGASTRIC
  Filled 2021-09-10: qty 90

## 2021-09-10 MED ORDER — PHENOL 1.4 % MT LIQD: 1.0000 | OROMUCOSAL | Status: DC | PRN

## 2021-09-10 NOTE — Progress Notes (Signed)
Patient nose bleed has increased. Per provider hold heparin for 6 hours. Pharmacy notified.  ?

## 2021-09-10 NOTE — Progress Notes (Addendum)
[facility] ?Progress Note ? ?   ?Subjective: ?CC-  ?NG tube stopped working last night and she became distended and felt pressure in her abdomen. NG working again and she is feeling better. Passing small amount of flatus, no BM. NG with 1850cc drainage ? ?Objective: ?Vital signs in last 24 hours: ?Temp:  [98 ?F (36.7 ?C)-98.4 ?F (36.9 ?C)] 98 ?F (36.7 ?C) (04/03 0549) ?Pulse Rate:  [70-77] 72 (04/03 0549) ?Resp:  [16-18] 18 (04/03 0549) ?BP: (132-158)/(76-94) 135/82 (04/03 0549) ?SpO2:  [94 %-95 %] 95 % (04/03 0549) ?Last BM Date : 09/06/21 ? ?Intake/Output from previous day: ?04/02 0701 - 04/03 0700 ?In: 1956 [I.V.:1956] ?Out: 2850 [Urine:1000; Emesis/NG output:1850] ?Intake/Output this shift: ?No intake/output data recorded. ? ?PE: ?Gen:  Alert, NAD, pleasant ?Abd: soft, minimal distension, NT ? ?Lab Results:  ?Recent Labs  ?  09/09/21 ?0154 09/10/21 ?0423  ?WBC 6.7 8.7  ?HGB 15.3* 15.1*  ?HCT 46.4* 46.9*  ?PLT 237 234  ? ?BMET ?Recent Labs  ?  09/09/21 ?0154 09/10/21 ?0423  ?NA 138 139  ?K 3.4* 4.1  ?CL 101 109  ?CO2 24 19*  ?GLUCOSE 120* 101*  ?BUN 42* 28*  ?CREATININE 0.93 0.71  ?CALCIUM 9.3 8.6*  ? ?PT/INR ?No results for input(s): LABPROT, INR in the last 72 hours. ?CMP  ?   ?Component Value Date/Time  ? NA 139 09/10/2021 0423  ? K 4.1 09/10/2021 0423  ? CL 109 09/10/2021 0423  ? CO2 19 (L) 09/10/2021 0423  ? GLUCOSE 101 (H) 09/10/2021 0423  ? BUN 28 (H) 09/10/2021 0423  ? CREATININE 0.71 09/10/2021 0423  ? CREATININE 0.79 03/14/2014 0905  ? CALCIUM 8.6 (L) 09/10/2021 0423  ? PROT 7.5 09/09/2021 0154  ? ALBUMIN 3.9 09/09/2021 0154  ? AST 13 (L) 09/09/2021 0154  ? ALT 19 09/09/2021 0154  ? ALKPHOS 68 09/09/2021 0154  ? BILITOT 0.9 09/09/2021 0154  ? GFRNONAA >60 09/10/2021 0423  ? GFRNONAA 78 03/14/2014 0905  ? GFRAA >60 11/07/2017 0151  ? GFRAA >89 03/14/2014 0905  ? ?Lipase  ?   ?Component Value Date/Time  ? LIPASE 26 09/08/2021 0840  ? ? ? ? ? ?Studies/Results: ?DG Abdomen 1 View ? ?Result  Date: 09/08/2021 ?CLINICAL DATA:  NG tube placement EXAM: ABDOMEN - 1 VIEW COMPARISON:  CT done earlier today FINDINGS: Only the upper abdomen is included in the image. Tip of enteric tube is seen in the fundus of the stomach. Side-port in the enteric tube is noted in the lower thoracic esophagus. Visualized lung fields are clear. IMPRESSION: Tip of enteric tube is seen in the fundus of stomach. Side-port in the enteric tube is noted in the lower thoracic esophagus close to the gastroesophageal junction. Enteric tube could be advanced 5-10 cm to place the side port within the stomach. Electronically Signed   By: Elmer Picker M.D.   On: 09/08/2021 12:24  ? ?CT ABDOMEN PELVIS W CONTRAST ? ?Result Date: 09/08/2021 ?CLINICAL DATA:  Abdominal pain, vomiting EXAM: CT ABDOMEN AND PELVIS WITH CONTRAST TECHNIQUE: Multidetector CT imaging of the abdomen and pelvis was performed using the standard protocol following bolus administration of intravenous contrast. RADIATION DOSE REDUCTION: This exam was performed according to the departmental dose-optimization program which includes automated exposure control, adjustment of the mA and/or kV according to patient size and/or use of iterative reconstruction technique. CONTRAST:  52m OMNIPAQUE IOHEXOL 300 MG/ML  SOLN COMPARISON:  11/03/2012 FINDINGS: Lower chest: Unremarkable. Hepatobiliary: No focal abnormality is seen  in the liver. There are multiple calcified gallbladder stones. Gallbladder is distended. There is no significant wall thickening in gallbladder. There is no fluid around the gallbladder. There is no dilation of bile ducts. Pancreas: No focal abnormalities are seen. There is pancreatic atrophy. Spleen: Unremarkable. Adrenals/Urinary Tract: Adrenals are unremarkable. There is no hydronephrosis. There are no renal or ureteral stones. Urinary bladder is not distended. Stomach/Bowel: Stomach is unremarkable. There is marked abnormal dilation of proximal small bowel  loops measuring up to 5 cm in maximum diameter. There is decompression of distal small bowel loops. Exact level of transition in the caliber is not clearly identified. It is possibly in the right lower quadrant. Degree of small-bowel dilation is worse in comparison to the previous study. Appendix is not distinctly seen. There is no focal pericecal inflammation. There is no significant wall thickening in colon. Scattered diverticula are seen in colon without signs of focal diverticulitis. Vascular/Lymphatic: There are scattered arterial calcifications. Reproductive: Uterus is not seen. Other: Trace amount of ascites is seen in the pelvis. There is no pneumoperitoneum. There is small ventral hernia containing fat slightly above the level of umbilicus. Small left inguinal hernia containing fat is seen. Musculoskeletal: Degenerative changes are noted in the lumbar spine, particularly severe at L5-S1 level with encroachment of neural foramina. IMPRESSION: High-grade partial small bowel obstruction with transition point in the right lower quadrant of abdomen. Dilated small bowel loops measure up to 5 cm in diameter. This may be caused by adhesions or internal hernia. Distal and terminal ileum appear to be collapsed. There is trace amount of ascites in the pelvic cavity. There is no pneumoperitoneum. Surgical consultation should be considered. There is no hydronephrosis. Gallbladder stones. Diverticulosis of colon. Other findings as described in the body of the report. Electronically Signed   By: Elmer Picker M.D.   On: 09/08/2021 10:50  ? ?DG Abd Portable 1V-Small Bowel Obstruction Protocol-initial, 8 hr delay ? ?Result Date: 09/09/2021 ?CLINICAL DATA:  Small bowel obstruction.  8 hour delay. EXAM: PORTABLE ABDOMEN - 1 VIEW COMPARISON:  Abdomen 09/08/2021, CT 09/08/2021 FINDINGS: No significant bowel contrast material is demonstrated. A few gas distended small bowel loops are demonstrated in the left upper quadrant  and left pelvis. This is consistent with obstruction as seen on the previous CT. Scattered gas in the colon. Residual contrast material in the bladder. An enteric tube is present in the left upper quadrant consistent with location in the upper stomach. Degenerative changes in the spine and hips. IMPRESSION: 1. No bowel contrast material is demonstrated. 2. Scattered gas-filled dilated small bowel loops consistent with history of small-bowel obstruction. 3. Enteric tube tip is in the left upper quadrant consistent with location in the stomach. 4. Residual contrast material is seen in the bladder. Electronically Signed   By: Lucienne Capers M.D.   On: 09/09/2021 01:01   ? ?Anti-infectives: ?Anti-infectives (From admission, onward)  ? ? None  ? ?  ? ? ? ?Assessment/Plan  ?SBO ?- Passing small amount of flatus but NG with high volume output and patient essentially failed clamping trial last night. Continue NG to LIWS and await further return in bowel function. OOB to chair. I do not see any gastrograffin on her xray, will repeat SBO protocol. Continue to hold eliquis. ? ?ID - none ?FEN - IVF, NPO/NGT to LIWS ?VTE - heparin gtt ?Foley - none ? ?PAF on eliquis (last dose 4/1 in AM) ?AKI - improved ? ?I reviewed hospitalist notes, last 24 h  vitals and pain scores, last 48 h intake and output, last 24 h labs and trends, and last 24 h imaging results ? ? ? LOS: 2 days  ? ? ?Wellington Hampshire, PA-C ?[facility] ?09/10/2021, 8:50 AM ?Please see Amion for pager number during day hours 7:00am-4:30pm ? ?

## 2021-09-10 NOTE — Progress Notes (Addendum)
?PROGRESS NOTE ? ? ? ?Kelly Walker  XBM:[id number] DOB: June 06, 1946 DOA: 09/08/2021 ?PCP: Jonathon Jordan, MD  ? ? ?Brief Narrative:  ?76 yo female with the past medical history of atrial fibrillation, gout, hyperlipidemia, history of small bowel obstruction in the past presented to hospital nausea vomiting not helped with antiemetics at home.  In the ED, initial vitals with stable.  Abdomen was tender to palpation on the right upper quadrant.  Initial labs showed hyponatremia with creatinine of 1.3.  CT scan of the abdomen and pelvis showed high-grade distal partial small bowel obstruction with transition point in the right lower quadrant with dilated small bowel loops up to 5 cm.  EKG was unremarkable.  Patient had NG tube placed in, surgery was consulted and was admitted hospital for further evaluation and treatment. ?  ?Assessment and Plan: ? ?Principal Problem: ?  SBO (small bowel obstruction) ([place]) ?Active Problems: ?  Paroxysmal atrial fibrillation ([place]) ?  AKI (acute kidney injury) ([place]) ?  Hyperglycemia ?  Hypokalemia ?  ?SBO (small bowel obstruction) ([place]) ?High-grade obstruction as per CT scan.  NG tube in place with decompression.  Feels a little better than yesterday.  Passing gas but no bowel movements.  General surgery on board.  Continue with IV fluid hydration, electrolyte replacement as necessary.  Continue supportive care.  Still NPO.  General surgery recommending SBO protocol.  Further plans as per general surgery. ? ?Paroxysmal atrial fibrillation ([place]) ?Currently sinus rhythm.  On metoprolol IV.  Heparin drip has been initiated for anticoagulation. ?At home patient on metoprolol succinate, flecainide, and diltiazem.  ? ?AKI (acute kidney injury) ([place]) ?Creatinine today at 0.7.  Initial creatinine around 1.3.  Improved with IV fluids.  We will continue to monitor ? ?Hypokalemia.  ?Mild.  Received potassium supplements.  Check BMP in AM.   ? ?Hyperglycemia ?Hemoglobin A1c of 5.6 at this time.  Not on  medications. ? ? DVT prophylaxis:   Heparin drip ? ? ?Code Status:   ?  Code Status: Full Code ? ?Disposition: Home ? ?Status is: Inpatient ? ?Remains inpatient appropriate because: Small bowel obstruction, ? ? Family Communication: Spoke with the patient at bedside.I spoke with the patient's spouse on the phone and updated him about the clinical condition of the patient. ? ?Consultants:  ?General surgery ? ?Procedures:  ?NG tube placement ? ?Antimicrobials:  ?None ? ?Anti-infectives (From admission, onward)  ? ? None  ? ?  ? ?Subjective: ?Today, patient was seen and examined at bedside.  Patient feels better with NG tube in.  Abdomen less distended without much pain.  No nausea or vomiting.  Has been passing flatus but no bowel movements ? ?Objective: ?Vitals:  ? 09/09/21 1404 09/09/21 2007 09/09/21 2159 09/10/21 0549  ?BP: (!) 151/94 132/76 (!) 158/87 135/82  ?Pulse: 75 77 70 72  ?Resp: '16 18  18  '$ ?Temp: 98.1 ?F (36.7 ?C) 98.4 ?F (36.9 ?C)  98 ?F (36.7 ?C)  ?TempSrc: Oral Oral  Oral  ?SpO2: 94% 95%  95%  ?Weight:      ?Height:      ? ? ?Intake/Output Summary (Last 24 hours) at 09/10/2021 1138 ?Last data filed at 09/10/2021 0600 ?Gross per 24 hour  ?Intake 1955.97 ml  ?Output 2650 ml  ?Net -694.03 ml  ? ?Filed Weights  ? 09/08/21 1422  ?Weight: 63.9 kg  ? ? ?Physical Examination: ?Body mass index is 23.44 kg/m?.  ? ?General:  Average built, not in obvious distress, NG tube in place  with greenish fluid. ?HENT:   No scleral pallor or icterus noted. Oral mucosa is moist.  ?Chest:  Clear breath sounds.  Diminished breath sounds bilaterally. No crackles or wheezes.  ?CVS: S1 &S2 heard. No murmur.  Regular rate and rhythm. ?Abdomen: Soft, nontender, nondistended.  Unable to hear bowel sounds. ?Extremities: No cyanosis, clubbing or edema.  Peripheral pulses are palpable. ?Psych: Alert, awake and oriented, normal mood ?CNS:  No cranial nerve deficits.  Power equal in all extremities.   ?Skin: Warm and dry.  No rashes  noted. ? ?Data Reviewed:  ? ?CBC: ?Recent Labs  ?Lab 09/08/21 ?0840 09/09/21 ?0154 09/10/21 ?0423  ?WBC 6.9 6.7 8.7  ?NEUTROABS 3.6  --   --   ?HGB 16.5* 15.3* 15.1*  ?HCT 50.0* 46.4* 46.9*  ?MCV 85.5 87.1 90.4  ?PLT 265 237 234  ? ? ?Basic Metabolic Panel: ?Recent Labs  ?Lab 09/08/21 ?0840 09/09/21 ?0154 09/10/21 ?0423  ?NA 134* 138 139  ?K 4.2 3.4* 4.1  ?CL 92* 101 109  ?CO2 25 24 19*  ?GLUCOSE 142* 120* 101*  ?BUN 43* 42* 28*  ?CREATININE 1.32* 0.93 0.71  ?CALCIUM 10.8* 9.3 8.6*  ?MG  --   --  2.2  ? ? ?Liver Function Tests: ?Recent Labs  ?Lab 09/08/21 ?0840 09/09/21 ?0154  ?AST 12* 13*  ?ALT 15 19  ?ALKPHOS 71 68  ?BILITOT 1.2 0.9  ?PROT 8.9* 7.5  ?ALBUMIN 4.7 3.9  ? ? ? ?Radiology Studies: ?DG Abdomen 1 View ? ?Result Date: 09/08/2021 ?CLINICAL DATA:  NG tube placement EXAM: ABDOMEN - 1 VIEW COMPARISON:  CT done earlier today FINDINGS: Only the upper abdomen is included in the image. Tip of enteric tube is seen in the fundus of the stomach. Side-port in the enteric tube is noted in the lower thoracic esophagus. Visualized lung fields are clear. IMPRESSION: Tip of enteric tube is seen in the fundus of stomach. Side-port in the enteric tube is noted in the lower thoracic esophagus close to the gastroesophageal junction. Enteric tube could be advanced 5-10 cm to place the side port within the stomach. Electronically Signed   By: Elmer Picker M.D.   On: 09/08/2021 12:24  ? ?DG Abd Portable 1V-Small Bowel Obstruction Protocol-initial, 8 hr delay ? ?Result Date: 09/09/2021 ?CLINICAL DATA:  Small bowel obstruction.  8 hour delay. EXAM: PORTABLE ABDOMEN - 1 VIEW COMPARISON:  Abdomen 09/08/2021, CT 09/08/2021 FINDINGS: No significant bowel contrast material is demonstrated. A few gas distended small bowel loops are demonstrated in the left upper quadrant and left pelvis. This is consistent with obstruction as seen on the previous CT. Scattered gas in the colon. Residual contrast material in the bladder. An enteric  tube is present in the left upper quadrant consistent with location in the upper stomach. Degenerative changes in the spine and hips. IMPRESSION: 1. No bowel contrast material is demonstrated. 2. Scattered gas-filled dilated small bowel loops consistent with history of small-bowel obstruction. 3. Enteric tube tip is in the left upper quadrant consistent with location in the stomach. 4. Residual contrast material is seen in the bladder. Electronically Signed   By: Lucienne Capers M.D.   On: 09/09/2021 01:01   ? ? ? LOS: 2 days  ? ? ?Flora Lipps, MD ?[facility] ?09/10/2021, 11:38 AM  ? ? ?

## 2021-09-10 NOTE — Progress Notes (Signed)
ANTICOAGULATION CONSULT NOTE - Follow Up Consult ? ?Pharmacy Consult for IV heparin ?Indication: atrial fibrillation ? ?Allergies  ?Allergen Reactions  ? Lecithin Other (See Comments)  ?  Intestinal distress  ? Other   ?  Fluoroquinolones Antibiotics=increased heartbeat,fatigue,lack of focus   ? Potassium Nitrate Other (See Comments)  ?  & Potassium Nitrite=Increased heart rate,disorientation   ? Sulfa Antibiotics   ?  Joint pain  ? Sulfasalazine Other (See Comments)  ?  Joint pain  ? Ciprofloxacin Anxiety  ? Flagyl [Metronidazole] Anxiety  ? ? ?Patient Measurements: ?Height: '5\' 5"'$  (165.1 cm) ?Weight: 63.9 kg (140 lb 14 oz) ?IBW/kg (Calculated) : 57 ?Heparin Dosing Weight: 63.9 kg  ? ?Vital Signs: ?Temp: 98.4 ?F (36.9 ?C) (04/02 2007) ?Temp Source: Oral (04/02 2007) ?BP: 158/87 (04/02 2159) ?Pulse Rate: 70 (04/02 2159) ? ?Labs: ?Recent Labs  ?  09/08/21 ?0840 09/08/21 ?0840 09/08/21 ?1556 09/09/21 ?0154 09/09/21 ?0758 09/09/21 ?1610 09/10/21 ?0423  ?HGB 16.5*  --   --  15.3*  --   --  15.1*  ?HCT 50.0*  --   --  46.4*  --   --  46.9*  ?PLT 265  --   --  237  --   --  234  ?APTT  --    < > 24 38* 48* 82* 86*  ?HEPARINUNFRC  --   --  >1.10* >1.10*  --   --  0.95*  ?CREATININE 1.32*  --   --  0.93  --   --   --   ? < > = values in this interval not displayed.  ? ? ? ?Estimated Creatinine Clearance: 47.8 mL/min (by C-G formula based on SCr of 0.93 mg/dL). ? ?Assessment: ?Pharmacy is consulted to dose heparin drip in 76 yo female with PMH of atrial fibrillation. Pt on Eliquis 5 mg BID with last dose per mar being on 3/31 at 1900. Pt being started on IV heparin as pt has been diagnosed with SBO.  ? ?Today, 09/10/21 ?Heparin level 0.95 units/mL, elevated due to recent apixaban use.  Dosing heparin using aPTT until heparin level and aPTT correlate.  ?aPTT = 95 seconds, therapeutic on IV heparin at 1200 units/hr ?CBC: Hgb 15.1, Pltc WNL ?No bleeding or infusion issues noted per nursing ? ?Goal of Therapy:  ?Heparin level  0.3-0.7 units/ml ?aPTT 66-102 seconds ?Monitor platelets by anticoagulation protocol: Yes ?  ?Plan:  ?Continue heparin infusion at 1200 units/hr ?Daily CBC, aPTT, heparin level ?Monitor for signs and symptoms of bleeding ? ?Netta Cedars, PharmD, BCPS ?Clinical Pharmacist  ?09/10/2021 5:19 AM ? ?

## 2021-09-11 DIAGNOSIS — N179 Acute kidney failure, unspecified: Secondary | ICD-10-CM | POA: Diagnosis not present

## 2021-09-11 DIAGNOSIS — K56609 Unspecified intestinal obstruction, unspecified as to partial versus complete obstruction: Secondary | ICD-10-CM | POA: Diagnosis not present

## 2021-09-11 DIAGNOSIS — R040 Epistaxis: Secondary | ICD-10-CM | POA: Diagnosis not present

## 2021-09-11 DIAGNOSIS — E876 Hypokalemia: Secondary | ICD-10-CM | POA: Diagnosis not present

## 2021-09-11 DIAGNOSIS — R739 Hyperglycemia, unspecified: Secondary | ICD-10-CM | POA: Diagnosis not present

## 2021-09-11 LAB — CBC
HCT: 43.6 % (ref 36.0–46.0)
Hemoglobin: 14.1 g/dL (ref 12.0–15.0)
MCH: 28.9 pg (ref 26.0–34.0)
MCHC: 32.3 g/dL (ref 30.0–36.0)
MCV: 89.3 fL (ref 80.0–100.0)
Platelets: 198 10*3/uL (ref 150–400)
RBC: 4.88 MIL/uL (ref 3.87–5.11)
RDW: 12.8 % (ref 11.5–15.5)
WBC: 8.5 10*3/uL (ref 4.0–10.5)
nRBC: 0 % (ref 0.0–0.2)

## 2021-09-11 LAB — COMPREHENSIVE METABOLIC PANEL
ALT: 16 U/L (ref 0–44)
AST: 10 U/L — ABNORMAL LOW (ref 15–41)
Albumin: 3.2 g/dL — ABNORMAL LOW (ref 3.5–5.0)
Alkaline Phosphatase: 59 U/L (ref 38–126)
Anion gap: 9 (ref 5–15)
BUN: 24 mg/dL — ABNORMAL HIGH (ref 8–23)
CO2: 20 mmol/L — ABNORMAL LOW (ref 22–32)
Calcium: 8.4 mg/dL — ABNORMAL LOW (ref 8.9–10.3)
Chloride: 114 mmol/L — ABNORMAL HIGH (ref 98–111)
Creatinine, Ser: 0.79 mg/dL (ref 0.44–1.00)
GFR, Estimated: >60 mL/min (ref >60)
Glucose, Bld: 94 mg/dL (ref 70–99)
Potassium: 3.6 mmol/L (ref 3.5–5.1)
Sodium: 143 mmol/L (ref 135–145)
Total Bilirubin: 0.6 mg/dL (ref 0.3–1.2)
Total Protein: 6.2 g/dL — ABNORMAL LOW (ref 6.5–8.1)

## 2021-09-11 LAB — PHOSPHORUS: Phosphorus: 2.2 mg/dL — ABNORMAL LOW (ref 2.5–4.6)

## 2021-09-11 LAB — MAGNESIUM: Magnesium: 2.3 mg/dL (ref 1.7–2.4)

## 2021-09-11 LAB — HEPARIN LEVEL (UNFRACTIONATED): Heparin Unfractionated: 0.72 IU/mL — ABNORMAL HIGH (ref 0.30–0.70)

## 2021-09-11 LAB — APTT: aPTT: 78 seconds — ABNORMAL HIGH (ref 24–36)

## 2021-09-11 NOTE — Progress Notes (Signed)
[facility] ?Progress Note ? ?   ?Subjective: ?CC-  ?Feeling better today. Passing flatus and had a normal sized BM yesterday. NG tube with 1.8L out last 24 hours.  ?Delayed film does show contrast in her colon, as well as some persistently dilated loops of small intestine. ? ?Objective: ?Vital signs in last 24 hours: ?Temp:  [97.9 ?F (36.6 ?C)-98.1 ?F (36.7 ?C)] 97.9 ?F (36.6 ?C) (04/04 9924) ?Pulse Rate:  [70-75] 70 (04/04 0508) ?Resp:  [16-18] 16 (04/04 0508) ?BP: (133-135)/(73-81) 134/73 (04/04 2683) ?SpO2:  [93 %-97 %] 96 % (04/04 0508) ?Last BM Date : 09/10/21 ? ?Intake/Output from previous day: ?04/03 0701 - 04/04 0700 ?In: 1883.7 [I.V.:1883.7] ?Out: 1800 [Emesis/NG output:1800] ?Intake/Output this shift: ?No intake/output data recorded. ? ?PE: ?Gen:  Alert, NAD, pleasant ?Abd: soft, minimal distension, NT ? ?Lab Results:  ?Recent Labs  ?  09/10/21 ?0423 09/11/21 ?4196  ?WBC 8.7 8.5  ?HGB 15.1* 14.1  ?HCT 46.9* 43.6  ?PLT 234 198  ? ?BMET ?Recent Labs  ?  09/10/21 ?0423 09/11/21 ?2229  ?NA 139 143  ?K 4.1 3.6  ?CL 109 114*  ?CO2 19* 20*  ?GLUCOSE 101* 94  ?BUN 28* 24*  ?CREATININE 0.71 0.79  ?CALCIUM 8.6* 8.4*  ? ?PT/INR ?No results for input(s): LABPROT, INR in the last 72 hours. ?CMP  ?   ?Component Value Date/Time  ? NA 143 09/11/2021 0504  ? K 3.6 09/11/2021 0504  ? CL 114 (H) 09/11/2021 0504  ? CO2 20 (L) 09/11/2021 0504  ? GLUCOSE 94 09/11/2021 0504  ? BUN 24 (H) 09/11/2021 0504  ? CREATININE 0.79 09/11/2021 0504  ? CREATININE 0.79 03/14/2014 0905  ? CALCIUM 8.4 (L) 09/11/2021 0504  ? PROT 6.2 (L) 09/11/2021 0504  ? ALBUMIN 3.2 (L) 09/11/2021 0504  ? AST 10 (L) 09/11/2021 0504  ? ALT 16 09/11/2021 0504  ? ALKPHOS 59 09/11/2021 0504  ? BILITOT 0.6 09/11/2021 0504  ? GFRNONAA >60 09/11/2021 0504  ? GFRNONAA 78 03/14/2014 0905  ? GFRAA >60 11/07/2017 0151  ? GFRAA >89 03/14/2014 0905  ? ?Lipase  ?   ?Component Value Date/Time  ? LIPASE 26 09/08/2021 0840  ? ? ? ? ? ?Studies/Results: ?DG Abd  Portable 1V-Small Bowel Obstruction Protocol-initial, 8 hr delay ? ?Result Date: 09/10/2021 ?CLINICAL DATA:  Small-bowel obstruction EXAM: PORTABLE ABDOMEN - 1 VIEW COMPARISON:  09/09/2021 FINDINGS: Administered oral contrast is seen within the a ascending and proximal transverse colon. There is, however, residual contrast with multiple dilated loops of bowel seen within the right lower quadrant. Together, the findings suggest changes of a distal partial small bowel obstruction. Nasogastric tube tip noted within the expected gastric fundus. No gross free intraperitoneal gas. No organomegaly. Degenerative changes are seen within the lumbar spine and hips bilaterally. IMPRESSION: Partial distal small bowel obstruction. Contrast opacifies multiple dilated loops of bowel within the right lower quadrant as well as nondilated ascending and proximal transverse colon. Electronically Signed   By: Fidela Salisbury M.D.   On: 09/10/2021 20:01   ? ?Anti-infectives: ?Anti-infectives (From admission, onward)  ? ? None  ? ?  ? ? ? ?Assessment/Plan ?SBO ?- Patient has had return in bowel function and follow up film shows contrast in the colon. She did, however, have high volume output from the NG tube over the last 24 hours, and her xray still shows some dilated loops of small intestine. Will keep NG to LIWS for now and monitor output this morning. If output  is trending down will attempt clamping trial this afternoon. Continue to hold eliquis. ?  ?ID - none ?FEN - IVF, NPO/NGT to LIWS ?VTE - heparin gtt ?Foley - none ?  ?PAF on eliquis (last dose 4/1 in AM) ?AKI - improved ? ?I independently reviewed lab work from this morning (CBC, CMP, magnesium, phosphorous), as well as the abdominal xray (agree with radiologist that there is contrast in the colon and some persistently dilated loops of small intestine).  ? ? LOS: 3 days  ? ? ?Wellington Hampshire, PA-C ?[facility] ?09/11/2021, 8:48 AM ?Please see Amion for pager number during  day hours 7:00am-4:30pm ? ?

## 2021-09-11 NOTE — Progress Notes (Signed)
?PROGRESS NOTE ? ? ? ?Kelly Walker  WJX:[id number] DOB: Aug 13, 1945 DOA: 09/08/2021 ?PCP: Jonathon Jordan, MD  ? ? ?Brief Narrative:  ?76 yo female with the past medical history of atrial fibrillation, gout, hyperlipidemia, history of small bowel obstruction in the past presented to hospital nausea vomiting not helped with antiemetics at home.  In the ED, initial vitals with stable.  Abdomen was tender to palpation on the right upper quadrant.  Initial labs showed hyponatremia with creatinine of 1.3.  CT scan of the abdomen and pelvis showed high-grade distal partial small bowel obstruction with transition point in the right lower quadrant with dilated small bowel loops up to 5 cm.  EKG was unremarkable.  Patient had NG tube placed in, surgery was consulted and was admitted hospital for further evaluation and treatment. ?  ?Assessment and Plan: ? ?Principal Problem: ?  SBO (small bowel obstruction) ([place]) ?Active Problems: ?  Paroxysmal atrial fibrillation ([place]) ?  Hyperglycemia ?  Hypokalemia ?  Hypophosphatemia ?  Epistaxis ? ?SBO (small bowel obstruction) ([place]) ?High-grade obstruction as per CT scan.  NG tube in place with decompression.  Still has high NG output so on low intermittent suction.  However continues to feel better and has been passing gas and had some small bowel movement yesterday.  Neurosurgery following.   SBO protocol showed contrast passes in the colon.  Further plans as per general surgery. ? ?Paroxysmal atrial fibrillation ([place]) ?Currently sinus rhythm.  On metoprolol IV.  Heparin drip has been initiated for anticoagulation but patient has been having episodes of epistaxis. At home patient on metoprolol succinate, flecainide, and diltiazem.  Patient takes Eliquis as outpatient. ? ?Epistaxis from the site of NG tube.  Improves when holding heparin.  Hopefully can get rid of the NG tube soon.. ? ?AKI (acute kidney injury) ([place]) ?Improved.  Creatinine today at 0.7.  Initial creatinine around 1.3.   Received IV fluids during hospitalization  ? ?Hypokalemia.  ?Improved.  Latest potassium of 3.6.  Continue to replenish ? ?Hypophosphatemia.  Mild.  We will replenish with K-Phos today.  Check levels in a.m. ? ?Hyperglycemia ?Hemoglobin A1c of 5.6 at this time.   ? ? DVT prophylaxis:   Heparin drip ? ? ?Code Status:   ?  Code Status: Full Code ? ?Disposition: Home 1 to 2 days when okay with surgery and small bowel obstruction resolved ? ?Status is: Inpatient ? ?Remains inpatient appropriate because: Small bowel obstruction, NG tube in place, on heparin drip ? ? Family Communication:  ? ?I spoke with the patient's spouse on the phone and updated him about the clinical condition of the patient. ? ?Consultants:  ?General surgery ? ?Procedures:  ?NG tube placement ? ?Antimicrobials:  ?None ? ?Anti-infectives (From admission, onward)  ? ? None  ? ?  ? ?Subjective: ?Today, patient was seen and examined at bedside.  Continues to feel better.  Has had bowel movement and has been passing gas.  Patient did have some bleeding from the NG tube site while on heparin drip and had to be stopped for a while.  Has had recurrent bleed this morning.   ? ?Objective: ?Vitals:  ? 09/10/21 0549 09/10/21 1311 09/10/21 2047 09/11/21 0508  ?BP: 135/82 133/81 135/78 134/73  ?Pulse: 72 75 74 70  ?Resp: '18 16 18 16  '$ ?Temp: 98 ?F (36.7 ?C) 98.1 ?F (36.7 ?C) 98 ?F (36.7 ?C) 97.9 ?F (36.6 ?C)  ?TempSrc: Oral Oral Oral Oral  ?SpO2: 95% 97% 93% 96%  ?Weight:      ?  Height:      ? ? ?Intake/Output Summary (Last 24 hours) at 09/11/2021 1125 ?Last data filed at 09/11/2021 0515 ?Gross per 24 hour  ?Intake 1883.65 ml  ?Output 1800 ml  ?Net 83.65 ml  ? ?Filed Weights  ? 09/08/21 1422  ?Weight: 63.9 kg  ? ? ?Physical Examination: ?Body mass index is 23.44 kg/m?.  ? ?General:  Average built, not in obvious distress, NG tube in place with greenish fluid. ?HENT:   No scleral pallor or icterus noted. Oral mucosa is moist.  ?Chest:  Clear breath sounds.  Diminished  breath sounds bilaterally. No crackles or wheezes.  ?CVS: S1 &S2 heard. No murmur.  Regular rate and rhythm. ?Abdomen: Soft, nontender, mild distention.   ?Extremities: No cyanosis, clubbing or edema.  Peripheral pulses are palpable. ?Psych: Alert, awake and oriented, normal mood ?CNS:  No cranial nerve deficits.  Power equal in all extremities.   ?Skin: Warm and dry.  No rashes noted. ? ? ?Data Reviewed:  ? ?CBC: ?Recent Labs  ?Lab 09/08/21 ?0840 09/09/21 ?0154 09/10/21 ?0423 09/11/21 ?2951  ?WBC 6.9 6.7 8.7 8.5  ?NEUTROABS 3.6  --   --   --   ?HGB 16.5* 15.3* 15.1* 14.1  ?HCT 50.0* 46.4* 46.9* 43.6  ?MCV 85.5 87.1 90.4 89.3  ?PLT 265 237 234 198  ? ? ?Basic Metabolic Panel: ?Recent Labs  ?Lab 09/08/21 ?0840 09/09/21 ?0154 09/10/21 ?0423 09/11/21 ?8841  ?NA 134* 138 139 143  ?K 4.2 3.4* 4.1 3.6  ?CL 92* 101 109 114*  ?CO2 25 24 19* 20*  ?GLUCOSE 142* 120* 101* 94  ?BUN 43* 42* 28* 24*  ?CREATININE 1.32* 0.93 0.71 0.79  ?CALCIUM 10.8* 9.3 8.6* 8.4*  ?MG  --   --  2.2 2.3  ?PHOS  --   --   --  2.2*  ? ? ?Liver Function Tests: ?Recent Labs  ?Lab 09/08/21 ?0840 09/09/21 ?0154 09/11/21 ?6606  ?AST 12* 13* 10*  ?ALT '15 19 16  '$ ?ALKPHOS 71 68 59  ?BILITOT 1.2 0.9 0.6  ?PROT 8.9* 7.5 6.2*  ?ALBUMIN 4.7 3.9 3.2*  ? ? ? ?Radiology Studies: ?DG Abd Portable 1V-Small Bowel Obstruction Protocol-initial, 8 hr delay ? ?Result Date: 09/10/2021 ?CLINICAL DATA:  Small-bowel obstruction EXAM: PORTABLE ABDOMEN - 1 VIEW COMPARISON:  09/09/2021 FINDINGS: Administered oral contrast is seen within the a ascending and proximal transverse colon. There is, however, residual contrast with multiple dilated loops of bowel seen within the right lower quadrant. Together, the findings suggest changes of a distal partial small bowel obstruction. Nasogastric tube tip noted within the expected gastric fundus. No gross free intraperitoneal gas. No organomegaly. Degenerative changes are seen within the lumbar spine and hips bilaterally. IMPRESSION:  Partial distal small bowel obstruction. Contrast opacifies multiple dilated loops of bowel within the right lower quadrant as well as nondilated ascending and proximal transverse colon. Electronically Signed   By: Fidela Salisbury M.D.   On: 09/10/2021 20:01   ? ? ? LOS: 3 days  ? ? ?Flora Lipps, MD ?[facility] ?09/11/2021, 11:25 AM  ? ? ?

## 2021-09-11 NOTE — Progress Notes (Signed)
ANTICOAGULATION CONSULT NOTE - Follow Up Consult ? ?Pharmacy Consult for heparin ?Indication: hx atrial fibrillation ? ?Allergies  ?Allergen Reactions  ? Lecithin Other (See Comments)  ?  Intestinal distress  ? Other   ?  Fluoroquinolones Antibiotics=increased heartbeat,fatigue,lack of focus   ? Potassium Nitrate Other (See Comments)  ?  & Potassium Nitrite=Increased heart rate,disorientation   ? Sulfa Antibiotics   ?  Joint pain  ? Sulfasalazine Other (See Comments)  ?  Joint pain  ? Ciprofloxacin Anxiety  ? Flagyl [Metronidazole] Anxiety  ? ? ?Patient Measurements: ?Height: '5\' 5"'$  (165.1 cm) ?Weight: 63.9 kg (140 lb 14 oz) ?IBW/kg (Calculated) : 57 ?Heparin Dosing Weight: 64 kg ? ?Vital Signs: ?Temp: 97.9 ?F (36.6 ?C) (04/04 6063) ?Temp Source: Oral (04/04 0160) ?BP: 134/73 (04/04 1093) ?Pulse Rate: 70 (04/04 0508) ? ?Labs: ?Recent Labs  ?  09/09/21 ?0154 09/09/21 ?0758 09/09/21 ?1917 09/10/21 ?0423 09/11/21 ?2355  ?HGB 15.3*  --   --  15.1* 14.1  ?HCT 46.4*  --   --  46.9* 43.6  ?PLT 237  --   --  234 198  ?APTT 38*   < > 82* 86* 78*  ?HEPARINUNFRC >1.10*  --   --  0.95* 0.72*  ?CREATININE 0.93  --   --  0.71 0.79  ? < > = values in this interval not displayed.  ? ? ?Estimated Creatinine Clearance: 55.5 mL/min (by C-G formula based on SCr of 0.79 mg/dL). ? ? ?Medications:  ?- on Eliquis 5 mg bid PTA (Last dose taken on 09/07/21 at 7p) ? ?Assessment: ?Patient is a 76 y.o F with hx afib on Eliquis PTA, presented to the ED on 09/08/21 with c/o abdominal pain and n/v.  Abdominal CT on 09/08/21 showed high-grade partial SBO. Anticoag. transitioned to heparin drip on admission. ? ?Significant events: ?- 4/3: nosebleed noted with throughout the day with heparin drip held for ~3 hrs in the evening ? ?Today, 09/11/2021: ?- heparin level is elevated at 0.72 but this is likely from residual effect of Eliquis.  aPTT is therapeutic at 78 secs. Since heparin and aPTT levels are not correlating, will adjust heparin drip using aPTT at  this time. Once levels correlate, will monitor using heparin level. ?- hgb 14.1, plta 198k ?- per pt's RN, no bleeding noted this morning ? ?Goal of Therapy:  ?Heparin level 0.3-0.7 units/ml ?aPTT 66-102 seconds ?Monitor platelets by anticoagulation protocol: Yes ?  ?Plan:  ?- Continue heparin drip at 1200 units/hr ?- daily heparin level and aPTT ?- monitor for s/sx bleeding ? ?Dia Sitter P ?09/11/2021,7:12 AM ? ? ?

## 2021-09-11 NOTE — Progress Notes (Signed)
Patient experienced  nose bleed around 11 am this morning. Dr Louanne Belton notified . Verbal orders received to hold heparin for now. ?

## 2021-09-12 DIAGNOSIS — K56609 Unspecified intestinal obstruction, unspecified as to partial versus complete obstruction: Secondary | ICD-10-CM | POA: Diagnosis not present

## 2021-09-12 LAB — COMPREHENSIVE METABOLIC PANEL
ALT: 14 U/L (ref 0–44)
AST: 11 U/L — ABNORMAL LOW (ref 15–41)
Albumin: 2.9 g/dL — ABNORMAL LOW (ref 3.5–5.0)
Alkaline Phosphatase: 55 U/L (ref 38–126)
Anion gap: 10 (ref 5–15)
BUN: 15 mg/dL (ref 8–23)
CO2: 20 mmol/L — ABNORMAL LOW (ref 22–32)
Calcium: 7.8 mg/dL — ABNORMAL LOW (ref 8.9–10.3)
Chloride: 115 mmol/L — ABNORMAL HIGH (ref 98–111)
Creatinine, Ser: 0.65 mg/dL (ref 0.44–1.00)
GFR, Estimated: >60 mL/min (ref >60)
Glucose, Bld: 92 mg/dL (ref 70–99)
Potassium: 3.4 mmol/L — ABNORMAL LOW (ref 3.5–5.1)
Sodium: 145 mmol/L (ref 135–145)
Total Bilirubin: 0.9 mg/dL (ref 0.3–1.2)
Total Protein: 5.8 g/dL — ABNORMAL LOW (ref 6.5–8.1)

## 2021-09-12 LAB — CBC
HCT: 41.8 % (ref 36.0–46.0)
Hemoglobin: 13.6 g/dL (ref 12.0–15.0)
MCH: 29.4 pg (ref 26.0–34.0)
MCHC: 32.5 g/dL (ref 30.0–36.0)
MCV: 90.3 fL (ref 80.0–100.0)
Platelets: 195 10*3/uL (ref 150–400)
RBC: 4.63 MIL/uL (ref 3.87–5.11)
RDW: 12.9 % (ref 11.5–15.5)
WBC: 11.3 10*3/uL — ABNORMAL HIGH (ref 4.0–10.5)
nRBC: 0 % (ref 0.0–0.2)

## 2021-09-12 LAB — MAGNESIUM: Magnesium: 2 mg/dL (ref 1.7–2.4)

## 2021-09-12 MED ORDER — DILTIAZEM HCL ER COATED BEADS 120 MG PO CP24
120.0000 mg | ORAL_CAPSULE | Freq: Every day | ORAL | Status: DC
  Administered 2021-09-12: 120 mg via ORAL
  Filled 2021-09-12: qty 1

## 2021-09-12 MED ORDER — FLECAINIDE ACETATE 100 MG PO TABS
100.0000 mg | ORAL_TABLET | Freq: Two times a day (BID) | ORAL | Status: DC
  Administered 2021-09-12 – 2021-09-13 (×3): 100 mg via ORAL
  Filled 2021-09-12 (×4): qty 1

## 2021-09-12 MED ORDER — POTASSIUM CHLORIDE CRYS ER 20 MEQ PO TBCR
40.0000 meq | EXTENDED_RELEASE_TABLET | Freq: Once | ORAL | Status: AC
Start: 2021-09-12 — End: 2021-09-12
  Administered 2021-09-12: 40 meq via ORAL
  Filled 2021-09-12: qty 2

## 2021-09-12 MED ORDER — ACETAMINOPHEN 325 MG PO TABS: 650.0000 mg | ORAL_TABLET | Freq: Four times a day (QID) | ORAL | Status: DC | PRN

## 2021-09-12 MED ORDER — BOOST / RESOURCE BREEZE PO LIQD CUSTOM: 1.0000 | Freq: Three times a day (TID) | ORAL | Status: DC

## 2021-09-12 MED ORDER — DILTIAZEM HCL ER 60 MG PO CP12
120.0000 mg | ORAL_CAPSULE | Freq: Every day | ORAL | Status: DC
  Administered 2021-09-13: 120 mg via ORAL
  Filled 2021-09-12: qty 2

## 2021-09-12 MED ORDER — APIXABAN 5 MG PO TABS
5.0000 mg | ORAL_TABLET | Freq: Two times a day (BID) | ORAL | Status: DC
  Administered 2021-09-12 – 2021-09-13 (×2): 5 mg via ORAL
  Filled 2021-09-12 (×2): qty 1

## 2021-09-12 MED ORDER — METOPROLOL SUCCINATE ER 25 MG PO TB24: 25.0000 mg | ORAL_TABLET | Freq: Every day | ORAL | Status: DC

## 2021-09-12 MED ORDER — METOPROLOL SUCCINATE ER 25 MG PO TB24
25.0000 mg | ORAL_TABLET | Freq: Every day | ORAL | Status: DC
  Administered 2021-09-12: 25 mg via ORAL
  Filled 2021-09-12: qty 1

## 2021-09-12 MED ORDER — DILTIAZEM HCL ER 60 MG PO CP12: 120.0000 mg | ORAL_CAPSULE | Freq: Every day | ORAL | Status: DC

## 2021-09-12 NOTE — Assessment & Plan Note (Signed)
Improving, resume eliquis and follow ?

## 2021-09-12 NOTE — Progress Notes (Signed)
[facility] ?Progress Note ? ?   ?Subjective: ?CC-  ?Feeling much better. Tolerated NG clamping trial. Tolerated clear liquids without n/v. NG hooked back up to LIWS over night with minimal output - patient states she's not sure why, she was not having any worsening symptoms. She is passing flatus and had multiple BMs. ? ?Objective: ?Vital signs in last 24 hours: ?Temp:  [97.7 ?F (36.5 ?C)-97.9 ?F (36.6 ?C)] 97.8 ?F (36.6 ?C) (04/05 0453) ?Pulse Rate:  [75-122] 96 (04/05 0453) ?Resp:  [18-23] 18 (04/05 0453) ?BP: (115-141)/(65-87) 115/76 (04/05 0453) ?SpO2:  [96 %-98 %] 97 % (04/05 0453) ?Last BM Date : 09/12/21 ? ?Intake/Output from previous day: ?04/04 0701 - 04/05 0700 ?In: -  ?Out: 350 [Urine:350] ?Intake/Output this shift: ?No intake/output data recorded. ? ?PE: ?Gen:  Alert, NAD, pleasant ?Abd: soft, minimal distension, NT ? ?Lab Results:  ?Recent Labs  ?  09/11/21 ?7425 09/12/21 ?0510  ?WBC 8.5 11.3*  ?HGB 14.1 13.6  ?HCT 43.6 41.8  ?PLT 198 195  ? ?BMET ?Recent Labs  ?  09/11/21 ?9563 09/12/21 ?0510  ?NA 143 145  ?K 3.6 3.4*  ?CL 114* 115*  ?CO2 20* 20*  ?GLUCOSE 94 92  ?BUN 24* 15  ?CREATININE 0.79 0.65  ?CALCIUM 8.4* 7.8*  ? ?PT/INR ?No results for input(s): LABPROT, INR in the last 72 hours. ?CMP  ?   ?Component Value Date/Time  ? NA 145 09/12/2021 0510  ? K 3.4 (L) 09/12/2021 0510  ? CL 115 (H) 09/12/2021 0510  ? CO2 20 (L) 09/12/2021 0510  ? GLUCOSE 92 09/12/2021 0510  ? BUN 15 09/12/2021 0510  ? CREATININE 0.65 09/12/2021 0510  ? CREATININE 0.79 03/14/2014 0905  ? CALCIUM 7.8 (L) 09/12/2021 0510  ? PROT 5.8 (L) 09/12/2021 0510  ? ALBUMIN 2.9 (L) 09/12/2021 0510  ? AST 11 (L) 09/12/2021 0510  ? ALT 14 09/12/2021 0510  ? ALKPHOS 55 09/12/2021 0510  ? BILITOT 0.9 09/12/2021 0510  ? GFRNONAA >60 09/12/2021 0510  ? GFRNONAA 78 03/14/2014 0905  ? GFRAA >60 11/07/2017 0151  ? GFRAA >89 03/14/2014 0905  ? ?Lipase  ?   ?Component Value Date/Time  ? LIPASE 26 09/08/2021 0840   ? ? ? ? ? ?Studies/Results: ?DG Abd Portable 1V-Small Bowel Obstruction Protocol-initial, 8 hr delay ? ?Result Date: 09/10/2021 ?CLINICAL DATA:  Small-bowel obstruction EXAM: PORTABLE ABDOMEN - 1 VIEW COMPARISON:  09/09/2021 FINDINGS: Administered oral contrast is seen within the a ascending and proximal transverse colon. There is, however, residual contrast with multiple dilated loops of bowel seen within the right lower quadrant. Together, the findings suggest changes of a distal partial small bowel obstruction. Nasogastric tube tip noted within the expected gastric fundus. No gross free intraperitoneal gas. No organomegaly. Degenerative changes are seen within the lumbar spine and hips bilaterally. IMPRESSION: Partial distal small bowel obstruction. Contrast opacifies multiple dilated loops of bowel within the right lower quadrant as well as nondilated ascending and proximal transverse colon. Electronically Signed   By: Fidela Salisbury M.D.   On: 09/10/2021 20:01   ? ?Anti-infectives: ?Anti-infectives (From admission, onward)  ? ? None  ? ?  ? ? ? ?Assessment/Plan ?SBO ?- NG tube removed and advanced to full liquids. Add boost. Advance diet as tolerated. Ok to restart home medications including anticoagulation. Possibly home tomorrow if tolerating diet advancement and continues to clinically improve.  ?  ?ID - none ?FEN - IVF, FLD, Boost ?VTE - heparin gtt ?Foley - none ?  ?  PAF on eliquis (last dose 4/1 in AM) ?AKI - improved ?  ?I independently reviewed lab work from this morning (CBC, CMP, magnesium), last 24 hours of vitals, last 24 hours of intake/ output. I discussed the plan with the hospitalist team. ? ? ? LOS: 4 days  ? ? ?Wellington Hampshire, PA-C ?[facility] ?09/12/2021, 9:43 AM ?Please see Amion for pager number during day hours 7:00am-4:30pm ? ?

## 2021-09-12 NOTE — Assessment & Plan Note (Signed)
Replace and follow. ?

## 2021-09-12 NOTE — Progress Notes (Signed)
?PROGRESS NOTE ? ? ? ?Zykia Walla  TJQ:[id number] DOB: [birth year] DOA: 09/08/2021 ?PCP: Jonathon Jordan, MD  ?Chief Complaint  ?Patient presents with  ? Nausea  ? ? ?Brief Narrative:  ?Mrs. Falin was admitted to the hospital with the working diagnosis of small bowel obstruction.  ? ?76 yo female with the past medical history of atrial fibrillation, who presented with abdominal pain, nausea and vomiting for 4 days. Her symptoms were refractive to oral antiemetics at home and she decided to come to the ED for further evaluation. On her initial physical examination her blood pressure was 134/80, HR 78, RR 19 and 02 saturation 94%, her lungs were clear to auscultation, heart with S1 and S2 present, abdomen with tender to palpation at the right upper quadrant, with no distention, no lower extremity edema.  ? ?Na 134, K 4,2, CL 92, bicarbonate 25, glucose 142, bun 43 cr 1,3 ?AST 12, ALT 15, lipase 26  ?Wbc 6,9, hgb 16.5 hct 50, plt 265  ? ?CT abdomen and pelvis with high grade partial small bowel obstruction with transition point in the right lower quadrant of the abdomen. Dilated small bowel loops measure up to 5 cm in diameter.  ?Gallstones and colonic diverticulosis.  ? ?EKG 75 bpm, normal axis, normal intervals, sinus rhythm, with J point elevation V2 to V6 with no significant T wave changes, positive LVH.  ? ?Patient had NG tube placed for decompression with good toleration.  ?Surgery was consulted with recommendations for conservative medical care.  ?  ? ? ?Assessment & Plan: ?  ?Principal Problem: ?  SBO (small bowel obstruction) ([place]) ?Active Problems: ?  Paroxysmal atrial fibrillation ([place]) ?  Hyperglycemia ?  Hypokalemia ?  Epistaxis ?  Hypophosphatemia ? ? ?Assessment and Plan: ?* SBO (small bowel obstruction) ([place]) ?CT 4/1 with high grade partial SBO with transition points in the RLQ of abdomen, dilated small bowel loops measure up to 5 cm in diameter ?KUB 4/3 with partial distal small bowel obstruction,  contrast opacifies multiple dilated loops of bowel within RLQ as well as nondilated ascending and proximal transverse colon ?NG tube removed today per surgery ?Appreciate surgery recs, advanced to full liquids, possibly home tomorrow ? ? ?Paroxysmal atrial fibrillation ([facility]) ?Resume metoprolol succinate, flecainide, and diltiazem.  ?Will resume eliquis ? ?AKI (acute kidney injury) ([facility])-resolved as of 09/11/2021 ?resolved ? ?Hyperglycemia ?Follow a1c ? ?Hypokalemia ?Replace and follow ? ?Epistaxis ?Improving, resume eliquis and follow ? ? ?DVT prophylaxis: resuming eliquis 4/5 PM ?Code Status: full ?Family Communication: none ?Disposition:  ? ?Status is: Inpatient ?Remains inpatient appropriate because: advancing diet per gen surgery ?  ?Consultants:  ?surgery ? ?Procedures:  ?none ? ?Antimicrobials:  ?Anti-infectives (From admission, onward)  ? ? None  ? ?  ? ? ?Subjective: ?No complaints, feeling better with NG out ? ?Objective: ?Vitals:  ? 09/11/21 2232 09/11/21 2249 09/12/21 0453 09/12/21 1441  ?BP: 121/65  115/76 102/66  ?Pulse: (!) 122 (!) 101 96 75  ?Resp:   18 (!) 24  ?Temp:   97.8 ?F (36.6 ?C) 98 ?F (36.7 ?C)  ?TempSrc:   Oral Oral  ?SpO2:   97%   ?Weight:      ?Height:      ? ? ?Intake/Output Summary (Last 24 hours) at 09/12/2021 1923 ?Last data filed at 09/12/2021 0315 ?Gross per 24 hour  ?Intake --  ?Output 100 ml  ?Net -100 ml  ? ?Filed Weights  ? 09/08/21 1422  ?Weight: 63.9 kg  ? ? ?Examination: ? ?  General: No acute distress. ?Dried blood from L nare ?Cardiovascular: irregular ?Lungs:unlabored ?Abdomen: Soft, nontender, nondistended  ?Neurological: Alert and oriented ?3. Moves all extremities ?4 . Cranial nerves II through XII grossly intact. ?Skin: Warm and dry. No rashes or lesions. ?Extremities: No clubbing or cyanosis. No edema. ? ? ?Data Reviewed: I have personally reviewed following labs and imaging studies ? ?CBC: ?Recent Labs  ?Lab 09/08/21 ?0840 09/09/21 ?0154 09/10/21 ?0423 09/11/21 ?1191  09/12/21 ?0510  ?WBC 6.9 6.7 8.7 8.5 11.3*  ?NEUTROABS 3.6  --   --   --   --   ?HGB 16.5* 15.3* 15.1* 14.1 13.6  ?HCT 50.0* 46.4* 46.9* 43.6 41.8  ?MCV 85.5 87.1 90.4 89.3 90.3  ?PLT 265 237 234 198 195  ? ? ?Basic Metabolic Panel: ?Recent Labs  ?Lab 09/08/21 ?0840 09/09/21 ?0154 09/10/21 ?0423 09/11/21 ?4782 09/12/21 ?0510  ?NA 134* 138 139 143 145  ?K 4.2 3.4* 4.1 3.6 3.4*  ?CL 92* 101 109 114* 115*  ?CO2 25 24 19* 20* 20*  ?GLUCOSE 142* 120* 101* 94 92  ?BUN 43* 42* 28* 24* 15  ?CREATININE 1.32* 0.93 0.71 0.79 0.65  ?CALCIUM 10.8* 9.3 8.6* 8.4* 7.8*  ?MG  --   --  2.2 2.3 2.0  ?PHOS  --   --   --  2.2*  --   ? ? ?GFR: ?Estimated Creatinine Clearance: 55.5 mL/min (by C-G formula based on SCr of 0.65 mg/dL). ? ?Liver Function Tests: ?Recent Labs  ?Lab 09/08/21 ?0840 09/09/21 ?0154 09/11/21 ?9562 09/12/21 ?0510  ?AST 12* 13* 10* 11*  ?ALT '15 19 16 14  '$ ?ALKPHOS 71 68 59 55  ?BILITOT 1.2 0.9 0.6 0.9  ?PROT 8.9* 7.5 6.2* 5.8*  ?ALBUMIN 4.7 3.9 3.2* 2.9*  ? ? ?CBG: ?No results for input(s): GLUCAP in the last 168 hours. ? ? ?No results found for this or any previous visit (from the past 240 hour(s)).  ? ? ? ? ? ?Radiology Studies: ?DG Abd Portable 1V-Small Bowel Obstruction Protocol-initial, 8 hr delay ? ?Result Date: 09/10/2021 ?CLINICAL DATA:  Small-bowel obstruction EXAM: PORTABLE ABDOMEN - 1 VIEW COMPARISON:  09/09/2021 FINDINGS: Administered oral contrast is seen within the Grayer Sproles ascending and proximal transverse colon. There is, however, residual contrast with multiple dilated loops of bowel seen within the right lower quadrant. Together, the findings suggest changes of Alfrieda Tarry distal partial small bowel obstruction. Nasogastric tube tip noted within the expected gastric fundus. No gross free intraperitoneal gas. No organomegaly. Degenerative changes are seen within the lumbar spine and hips bilaterally. IMPRESSION: Partial distal small bowel obstruction. Contrast opacifies multiple dilated loops of bowel within the right  lower quadrant as well as nondilated ascending and proximal transverse colon. Electronically Signed   By: Fidela Salisbury M.D.   On: 09/10/2021 20:01   ? ? ? ? ? ?Scheduled Meds: ? [START ON 09/13/2021] diltiazem  120 mg Oral Daily  ? flecainide  100 mg Oral BID  ? metoprolol succinate  25 mg Oral QHS  ? pantoprazole (PROTONIX) IV  40 mg Intravenous Q24H  ? potassium chloride  40 mEq Oral Once  ? ?Continuous Infusions: ? sodium chloride 75 mL/hr at 09/11/21 1918  ? ? ? LOS: 4 days  ? ? ?Time spent: over 30 min ? ? ? ?Fayrene Helper, MD ?[facility] ? ? ?To contact the attending provider between 7A-7P or the covering provider during after hours 7P-7A, please log into the web site [email or web address] and access using universal [place] password  for that web site. If you do not have the password, please call the hospital operator. ? ?09/12/2021, 7:23 PM  ? ? ?

## 2021-09-13 DIAGNOSIS — K56609 Unspecified intestinal obstruction, unspecified as to partial versus complete obstruction: Secondary | ICD-10-CM | POA: Diagnosis not present

## 2021-09-13 LAB — COMPREHENSIVE METABOLIC PANEL
ALT: 18 U/L (ref 0–44)
AST: 16 U/L (ref 15–41)
Albumin: 2.5 g/dL — ABNORMAL LOW (ref 3.5–5.0)
Alkaline Phosphatase: 49 U/L (ref 38–126)
Anion gap: 5 (ref 5–15)
BUN: 13 mg/dL (ref 8–23)
CO2: 23 mmol/L (ref 22–32)
Calcium: 7.8 mg/dL — ABNORMAL LOW (ref 8.9–10.3)
Chloride: 111 mmol/L (ref 98–111)
Creatinine, Ser: 0.6 mg/dL (ref 0.44–1.00)
GFR, Estimated: >60 mL/min (ref >60)
Glucose, Bld: 104 mg/dL — ABNORMAL HIGH (ref 70–99)
Potassium: 3.9 mmol/L (ref 3.5–5.1)
Sodium: 139 mmol/L (ref 135–145)
Total Bilirubin: 0.5 mg/dL (ref 0.3–1.2)
Total Protein: 5.1 g/dL — ABNORMAL LOW (ref 6.5–8.1)

## 2021-09-13 LAB — CBC WITH DIFFERENTIAL/PLATELET
Abs Immature Granulocytes: 0.13 10*3/uL — ABNORMAL HIGH (ref 0.00–0.07)
Basophils Absolute: 0 10*3/uL (ref 0.0–0.1)
Basophils Relative: 0 %
Eosinophils Absolute: 0.2 10*3/uL (ref 0.0–0.5)
Eosinophils Relative: 2 %
HCT: 39.4 % (ref 36.0–46.0)
Hemoglobin: 12.6 g/dL (ref 12.0–15.0)
Immature Granulocytes: 1 %
Lymphocytes Relative: 26 %
Lymphs Abs: 2.5 10*3/uL (ref 0.7–4.0)
MCH: 28.8 pg (ref 26.0–34.0)
MCHC: 32 g/dL (ref 30.0–36.0)
MCV: 90.2 fL (ref 80.0–100.0)
Monocytes Absolute: 0.8 10*3/uL (ref 0.1–1.0)
Monocytes Relative: 8 %
Neutro Abs: 6 10*3/uL (ref 1.7–7.7)
Neutrophils Relative %: 63 %
Platelets: 179 10*3/uL (ref 150–400)
RBC: 4.37 MIL/uL (ref 3.87–5.11)
RDW: 13 % (ref 11.5–15.5)
WBC: 9.6 10*3/uL (ref 4.0–10.5)
nRBC: 0 % (ref 0.0–0.2)

## 2021-09-13 LAB — PHOSPHORUS: Phosphorus: 1.4 mg/dL — ABNORMAL LOW (ref 2.5–4.6)

## 2021-09-13 LAB — MAGNESIUM: Magnesium: 1.9 mg/dL (ref 1.7–2.4)

## 2021-09-13 MED ORDER — POTASSIUM PHOSPHATES 15 MMOLE/5ML IV SOLN
30.0000 mmol | Freq: Once | INTRAVENOUS | Status: AC
  Administered 2021-09-13: 30 mmol via INTRAVENOUS
  Filled 2021-09-13: qty 10

## 2021-09-13 MED ORDER — PANTOPRAZOLE SODIUM 40 MG PO TBEC
40.0000 mg | DELAYED_RELEASE_TABLET | Freq: Every day | ORAL | Status: DC
Start: 2021-09-13 — End: 2021-09-13
  Administered 2021-09-13: 40 mg via ORAL
  Filled 2021-09-13: qty 1

## 2021-09-13 NOTE — Progress Notes (Signed)
[facility] ?Progress Note ? ?   ?Subjective: ?CC-  ?No complaints. Tolerating diet. She ate some oatmeal for breakfast. Denies n/v. Passing flatus. She had multiple soft/ loose Bms yesterday. ? ?Objective: ?Vital signs in last 24 hours: ?Temp:  [97.8 ?F (36.6 ?C)-98.1 ?F (36.7 ?C)] 97.8 ?F (36.6 ?C) (04/06 0531) ?Pulse Rate:  [75-101] 82 (04/06 0531) ?Resp:  [18-24] 18 (04/06 0531) ?BP: (92-111)/(58-76) 92/58 (04/06 0531) ?SpO2:  [96 %-97 %] 96 % (04/06 0531) ?Last BM Date : 09/13/21 ? ?Intake/Output from previous day: ?No intake/output data recorded. ?Intake/Output this shift: ?No intake/output data recorded. ? ?PE: ?Gen:  Alert, NAD, pleasant ?Abd: soft, ND, NT ? ?Lab Results:  ?Recent Labs  ?  09/12/21 ?0510 09/13/21 ?7824  ?WBC 11.3* 9.6  ?HGB 13.6 12.6  ?HCT 41.8 39.4  ?PLT 195 179  ? ?BMET ?Recent Labs  ?  09/12/21 ?0510 09/13/21 ?2353  ?NA 145 139  ?K 3.4* 3.9  ?CL 115* 111  ?CO2 20* 23  ?GLUCOSE 92 104*  ?BUN 15 13  ?CREATININE 0.65 0.60  ?CALCIUM 7.8* 7.8*  ? ?PT/INR ?No results for input(s): LABPROT, INR in the last 72 hours. ?CMP  ?   ?Component Value Date/Time  ? NA 139 09/13/2021 0516  ? K 3.9 09/13/2021 0516  ? CL 111 09/13/2021 0516  ? CO2 23 09/13/2021 0516  ? GLUCOSE 104 (H) 09/13/2021 0516  ? BUN 13 09/13/2021 0516  ? CREATININE 0.60 09/13/2021 0516  ? CREATININE 0.79 03/14/2014 0905  ? CALCIUM 7.8 (L) 09/13/2021 0516  ? PROT 5.1 (L) 09/13/2021 0516  ? ALBUMIN 2.5 (L) 09/13/2021 0516  ? AST 16 09/13/2021 0516  ? ALT 18 09/13/2021 0516  ? ALKPHOS 49 09/13/2021 0516  ? BILITOT 0.5 09/13/2021 0516  ? GFRNONAA >60 09/13/2021 0516  ? GFRNONAA 78 03/14/2014 0905  ? GFRAA >60 11/07/2017 0151  ? GFRAA >89 03/14/2014 0905  ? ?Lipase  ?   ?Component Value Date/Time  ? LIPASE 26 09/08/2021 0840  ? ? ? ? ? ?Studies/Results: ?No results found. ? ?Anti-infectives: ?Anti-infectives (From admission, onward)  ? ? None  ? ?  ? ? ? ?Assessment/Plan ?SBO ?- Tolerating diet and having bowel function. [place]  for discharge from surgical standpoint.  ?  ?ID - none ?FEN - soft diet ?VTE - eliquis ?Foley - none ?  ?PAF on eliquis  ?AKI - improved ?  ?I independently reviewed lab work from this morning (CBC, CMP, magnesium, phosphorous), last 24 hours of vitals, last 24 hours of intake/ output. I discussed the plan with the hospitalist team. ? ? ? LOS: 5 days  ? ? ?Wellington Hampshire, PA-C ?[facility] ?09/13/2021, 9:14 AM ?Please see Amion for pager number during day hours 7:00am-4:30pm ? ?

## 2021-09-13 NOTE — Discharge Summary (Signed)
Physician Discharge Summary  ?Roselie Cirigliano ZTI:[id number] DOB: August 09, 1945 DOA: 09/08/2021 ? ?PCP: Jonathon Jordan, MD ? ?Admit date: 09/08/2021 ?Discharge date: 09/13/2021 ? ?Time spent: 40 minutes ? ?Recommendations for Outpatient Follow-up:  ?Follow outpatient CBC/CMP/mag/phos (phos replaced prior to discharge) ?Follow rates outpatient, at rest at goal, prior to discharge rates seemed to appropriately increase with activity (possibly [facility] bit exaggerated as she wasn't significantly exerting herself - 110's-130's, max 140's prior to discharge), she was asymptomatic, would continue to monitor and adjust regimen outpatient prn ?Follow pending a1c ? ?Discharge Diagnoses:  ?Principal Problem: ?  SBO (small bowel obstruction) ([place]) ?Active Problems: ?  Paroxysmal atrial fibrillation ([place]) ?  Hyperglycemia ?  Hypokalemia ?  Epistaxis ?  Hypophosphatemia ? ? ?Discharge Condition: stable ? ?Diet recommendation: heart healthy ? ?Filed Weights  ? 09/08/21 1422  ?Weight: 63.9 kg  ? ? ?History of present illness:  ?Mrs. Arrey was admitted to the hospital with the working diagnosis of small bowel obstruction.  ? ?76 yo female with the past medical history of atrial fibrillation, who presented with abdominal pain, nausea and vomiting for 4 days. [facility] [place] CT abdomen and pelvis with high grade partial small bowel obstruction with transition point in the right lower quadrant of the abdomen. Surgery was c/s and NG tube was placed.  She improved with conservative measures.   ? ?See below for additional details ? ? ?Hospital Course:  ?Assessment and Plan: ?* SBO (small bowel obstruction) ([place]) ?CT 4/1 with high grade partial SBO with transition points in the RLQ of abdomen, dilated small bowel loops measure up to 5 cm in diameter ?KUB 4/3 with partial distal small bowel obstruction, contrast opacifies multiple dilated loops of bowel within RLQ as well as nondilated ascending and proximal transverse colon ?NG tube removed 4/5 per surgery ?Appreciate  surgery recs, ok for discharge today, follow outpatient ? ? ?Paroxysmal atrial fibrillation (HCC) ?Resume metoprolol succinate, flecainide, and diltiazem.  ?Will resume eliquis ?She mentions concern given her HR -> she was told HR increased yesterday, she notes she was active during that time.  Does have documented RVR at times, but reportedly active, rate in 80's-90's while she's at rest during my evaluation.  Had her walk prior to discharge, rates increased to 110's-130's (max 140's), she was asymptomatic.  I think ok to continue current regimen and follow outpatient. ? ?AKI (acute kidney injury) (HCC)-resolved as of 09/11/2021 ?resolved ? ?Hyperglycemia ?Follow a1c - pending at time of discharge ? ?Hypokalemia ?Replace and follow ? ?Epistaxis ?Improving, resume eliquis and follow ? ?Hypophosphatemia ?Replace and follow ? ? ?Procedures: ?none  ? ?Consultations: ?surgery ? ?Discharge Exam: ?Vitals:  ? 09/12/21 2046 09/13/21 0531  ?BP: 111/76 (!) 92/58  ?Pulse: (!) 101 82  ?Resp: 18 18  ?Temp: 98.1 ?F (36.7 ?C) 97.8 ?F (36.6 ?C)  ?SpO2: 97% 96%  ? ?No new complaints ?Concerned about her afib and increase in HR -> seems like this happened with activity ? ?General: No acute distress. ?Cardiovascular: irrgularly irregular - rates on tele 80s-90s at rest ?Lungs: unlabored ?Abdomen: Soft, nontender, nondistended  ?Neurological: Alert and oriented ?3. Moves all extremities ?4 . Cranial nerves II through XII grossly intact. ?Skin: Warm and dry. No rashes or lesions. ?Extremities: No clubbing or cyanosis. No edema. ? ?Discharge Instructions ? ? ?Discharge Instructions   ? ? Call MD for:  difficulty breathing, headache or visual disturbances   Complete by: As directed ?  ? Call MD for:  extreme fatigue   Complete by:  As directed ?  ? Call MD for:  hives   Complete by: As directed ?  ? Call MD for:  persistant dizziness or light-headedness   Complete by: As directed ?  ? Call MD for:  persistant nausea and vomiting   Complete  by: As directed ?  ? Call MD for:  redness, tenderness, or signs of infection (pain, swelling, redness, odor or green/yellow discharge around incision site)   Complete by: As directed ?  ? Call MD for:  severe uncontrolled pain   Complete by: As directed ?  ? Call MD for:  temperature >100.4   Complete by: As directed ?  ? Diet - low sodium heart healthy   Complete by: As directed ?  ? Discharge instructions   Complete by: As directed ?  ? You were seen for Stephnie Parlier small bowel obstruction. ? ?This has resolved with conservative measures.  Continue [place] soft diet for the next few days.  Follow up with your PCP as an outpatient. ? ?We resumed your home regimen for your atrial fibrillation.  Your heart rate is at goal at rest.  It increases appropriately with activity, but I think Britiney Blahnik reasonable amount.  Continue to follow with your cardiologist as an outpatient for additional recommendations and adjustments to your regimen. ? ?Return for new, recurrent, or worsening symptoms. ? ?Please ask your PCP to request records from this hospitalization so they know what was done and what the next steps will be.  ? Increase activity slowly   Complete by: As directed ?  ? ?  ? ?Allergies as of 09/13/2021   ? ?   Reactions  ? Lecithin Other (See Comments)  ? Intestinal distress  ? Other   ? Fluoroquinolones Antibiotics=increased heartbeat,fatigue,lack of focus   ? Potassium Nitrate Other (See Comments)  ? & Potassium Nitrite=Increased heart rate,disorientation   ? Sulfa Antibiotics   ? Joint pain  ? Sulfasalazine Other (See Comments)  ? Joint pain  ? Ciprofloxacin Anxiety  ? Flagyl [metronidazole] Anxiety  ? ?  ? ?  ?Medication List  ?  ? ?STOP taking these medications   ? ?diltiazem 120 MG 24 hr capsule ?Commonly known as: CARDIZEM CD ?  ? ?  ? ?TAKE these medications   ? ?acetaminophen 650 MG CR tablet ?Commonly known as: TYLENOL ?Take 650 mg by mouth every 8 (eight) hours as needed for pain. ?  ?diltiazem 120 MG 12 hr capsule ?Commonly  known as: CARDIZEM SR ?Take 120 mg by mouth daily. ?  ?Eliquis 5 MG Tabs tablet ?Generic drug: apixaban ?TAKE 1 TABLET BY MOUTH TWICE DAILY ?  ?flecainide 100 MG tablet ?Commonly known as: TAMBOCOR ?Take 100 mg by mouth 2 (two) times daily. ?  ?metoprolol succinate 25 MG 24 hr tablet ?Commonly known as: TOPROL-XL ?Take 25 mg by mouth daily. ?  ?metoprolol tartrate 25 MG tablet ?Commonly known as: LOPRESSOR ?Take 0.5 tablets (12.5 mg total) by mouth 2 (two) times daily. ?What changed:  ?when to take this ?reasons to take this ?  ?ondansetron 4 MG disintegrating tablet ?Commonly known as: ZOFRAN-ODT ?Take 4 mg by mouth 3 (three) times daily as needed for nausea or vomiting. ?  ?vitamin B-12 1000 MCG tablet ?Commonly known as: CYANOCOBALAMIN ?Take 1,000 mcg by mouth daily. ?  ? ?  ? ?Allergies  ?Allergen Reactions  ? Lecithin Other (See Comments)  ?  Intestinal distress  ? Other   ?  Fluoroquinolones Antibiotics=increased heartbeat,fatigue,lack of focus   ?  Potassium Nitrate Other (See Comments)  ?  & Potassium Nitrite=Increased heart rate,disorientation   ? Sulfa Antibiotics   ?  Joint pain  ? Sulfasalazine Other (See Comments)  ?  Joint pain  ? Ciprofloxacin Anxiety  ? Flagyl [Metronidazole] Anxiety  ? ? Follow-up Information   ? ? Jonathon Jordan, MD Follow up.   ?Specialty: Family Medicine ?Contact information: ?[address] ?[address] ?[place] [address] ?[phone] ? ? ?  ?  ? ? Lorretta Harp, MD .   ?Specialties: Cardiology, Radiology ?Contact information: ?[address] ?[address] ?[place] [address] ?[phone] ? ? ?  ?  ? ?  ?  ? ?  ? ? ? ?The results of significant diagnostics from this hospitalization (including imaging, microbiology, ancillary and laboratory) are listed below for reference.   ? ?Significant Diagnostic Studies: ?DG Abdomen 1 View ? ?Result Date: 09/08/2021 ?CLINICAL DATA:  NG tube placement EXAM: ABDOMEN - 1 VIEW COMPARISON:  CT done earlier today FINDINGS: Only the  upper abdomen is included in the image. Tip of enteric tube is seen in the fundus of the stomach. Side-port in the enteric tube is noted in the lower thoracic esophagus. Visualized lung fields are clear. IMP

## 2021-09-13 NOTE — Discharge Instructions (Signed)

## 2021-09-13 NOTE — Assessment & Plan Note (Signed)
Replace and follow. ?

## 2021-09-14 LAB — HEMOGLOBIN A1C
Hgb A1c MFr Bld: 5.9 % — ABNORMAL HIGH (ref 4.8–5.6)
Mean Plasma Glucose: 123 mg/dL

## 2021-09-24 DIAGNOSIS — K56609 Unspecified intestinal obstruction, unspecified as to partial versus complete obstruction: Secondary | ICD-10-CM | POA: Diagnosis not present

## 2021-09-24 DIAGNOSIS — D6869 Other thrombophilia: Secondary | ICD-10-CM | POA: Diagnosis not present

## 2021-09-24 DIAGNOSIS — I480 Paroxysmal atrial fibrillation: Secondary | ICD-10-CM | POA: Diagnosis not present

## 2021-09-24 DIAGNOSIS — I700 Atherosclerosis of aorta: Secondary | ICD-10-CM | POA: Diagnosis not present

## 2021-10-11 DIAGNOSIS — Z79899 Other long term (current) drug therapy: Secondary | ICD-10-CM | POA: Diagnosis not present

## 2021-10-11 DIAGNOSIS — Z5181 Encounter for therapeutic drug level monitoring: Secondary | ICD-10-CM | POA: Diagnosis not present

## 2021-10-11 DIAGNOSIS — I480 Paroxysmal atrial fibrillation: Secondary | ICD-10-CM | POA: Diagnosis not present

## 2021-11-08 DIAGNOSIS — M1712 Unilateral primary osteoarthritis, left knee: Secondary | ICD-10-CM | POA: Diagnosis not present

## 2022-04-24 DIAGNOSIS — I480 Paroxysmal atrial fibrillation: Secondary | ICD-10-CM | POA: Diagnosis not present

## 2022-04-24 DIAGNOSIS — R7303 Prediabetes: Secondary | ICD-10-CM | POA: Diagnosis not present

## 2022-04-24 DIAGNOSIS — Z0000 Encounter for general adult medical examination without abnormal findings: Secondary | ICD-10-CM | POA: Diagnosis not present

## 2022-04-24 DIAGNOSIS — E559 Vitamin D deficiency, unspecified: Secondary | ICD-10-CM | POA: Diagnosis not present

## 2022-04-24 DIAGNOSIS — M159 Polyosteoarthritis, unspecified: Secondary | ICD-10-CM | POA: Diagnosis not present

## 2022-04-24 DIAGNOSIS — E7800 Pure hypercholesterolemia, unspecified: Secondary | ICD-10-CM | POA: Diagnosis not present

## 2022-04-24 DIAGNOSIS — M25562 Pain in left knee: Secondary | ICD-10-CM | POA: Diagnosis not present

## 2022-04-24 DIAGNOSIS — Z79899 Other long term (current) drug therapy: Secondary | ICD-10-CM | POA: Diagnosis not present

## 2022-04-24 DIAGNOSIS — I10 Essential (primary) hypertension: Secondary | ICD-10-CM | POA: Diagnosis not present

## 2022-05-13 DIAGNOSIS — I480 Paroxysmal atrial fibrillation: Secondary | ICD-10-CM | POA: Diagnosis not present

## 2022-05-13 DIAGNOSIS — M1712 Unilateral primary osteoarthritis, left knee: Secondary | ICD-10-CM | POA: Diagnosis not present

## 2022-05-13 DIAGNOSIS — R739 Hyperglycemia, unspecified: Secondary | ICD-10-CM | POA: Diagnosis not present

## 2022-05-13 DIAGNOSIS — I10 Essential (primary) hypertension: Secondary | ICD-10-CM | POA: Diagnosis not present

## 2022-05-13 DIAGNOSIS — E876 Hypokalemia: Secondary | ICD-10-CM | POA: Diagnosis not present

## 2022-05-13 DIAGNOSIS — M170 Bilateral primary osteoarthritis of knee: Secondary | ICD-10-CM | POA: Diagnosis not present

## 2022-05-13 DIAGNOSIS — K5790 Diverticulosis of intestine, part unspecified, without perforation or abscess without bleeding: Secondary | ICD-10-CM | POA: Diagnosis not present

## 2022-05-13 DIAGNOSIS — Z7901 Long term (current) use of anticoagulants: Secondary | ICD-10-CM | POA: Diagnosis not present

## 2022-05-13 DIAGNOSIS — M21061 Valgus deformity, not elsewhere classified, right knee: Secondary | ICD-10-CM | POA: Diagnosis not present

## 2022-05-13 DIAGNOSIS — K56609 Unspecified intestinal obstruction, unspecified as to partial versus complete obstruction: Secondary | ICD-10-CM | POA: Diagnosis not present

## 2022-05-13 DIAGNOSIS — E785 Hyperlipidemia, unspecified: Secondary | ICD-10-CM | POA: Diagnosis not present

## 2022-05-13 DIAGNOSIS — M21062 Valgus deformity, not elsewhere classified, left knee: Secondary | ICD-10-CM | POA: Diagnosis not present

## 2022-05-31 DIAGNOSIS — I480 Paroxysmal atrial fibrillation: Secondary | ICD-10-CM | POA: Diagnosis not present

## 2022-05-31 DIAGNOSIS — Z5181 Encounter for therapeutic drug level monitoring: Secondary | ICD-10-CM | POA: Diagnosis not present

## 2022-05-31 DIAGNOSIS — Z79899 Other long term (current) drug therapy: Secondary | ICD-10-CM | POA: Diagnosis not present

## 2022-06-18 DIAGNOSIS — M25562 Pain in left knee: Secondary | ICD-10-CM | POA: Diagnosis not present

## 2022-06-18 DIAGNOSIS — G8929 Other chronic pain: Secondary | ICD-10-CM | POA: Diagnosis not present

## 2022-07-08 DIAGNOSIS — H0102A Squamous blepharitis right eye, upper and lower eyelids: Secondary | ICD-10-CM | POA: Diagnosis not present

## 2022-07-08 DIAGNOSIS — Z961 Presence of intraocular lens: Secondary | ICD-10-CM | POA: Diagnosis not present

## 2022-07-08 DIAGNOSIS — H0102B Squamous blepharitis left eye, upper and lower eyelids: Secondary | ICD-10-CM | POA: Diagnosis not present

## 2022-07-08 DIAGNOSIS — H04123 Dry eye syndrome of bilateral lacrimal glands: Secondary | ICD-10-CM | POA: Diagnosis not present

## 2022-07-18 ENCOUNTER — Encounter (HOSPITAL_COMMUNITY): Payer: Self-pay | Admitting: *Deleted

## 2022-07-22 DIAGNOSIS — M25562 Pain in left knee: Secondary | ICD-10-CM | POA: Diagnosis not present

## 2022-07-22 DIAGNOSIS — G8929 Other chronic pain: Secondary | ICD-10-CM | POA: Diagnosis not present

## 2022-07-24 ENCOUNTER — Encounter (HOSPITAL_BASED_OUTPATIENT_CLINIC_OR_DEPARTMENT_OTHER): Payer: Self-pay

## 2022-07-24 ENCOUNTER — Other Ambulatory Visit: Payer: Self-pay

## 2022-07-24 ENCOUNTER — Emergency Department (HOSPITAL_BASED_OUTPATIENT_CLINIC_OR_DEPARTMENT_OTHER): Payer: Medicare PPO | Admitting: Radiology

## 2022-07-24 ENCOUNTER — Emergency Department (HOSPITAL_BASED_OUTPATIENT_CLINIC_OR_DEPARTMENT_OTHER)
Admission: EM | Admit: 2022-07-24 | Discharge: 2022-07-24 | Disposition: A | Discharge status: Home / Self Care | Payer: Medicare PPO | Attending: Emergency Medicine | Admitting: Emergency Medicine

## 2022-07-24 DIAGNOSIS — Z87891 Personal history of nicotine dependence: Secondary | ICD-10-CM | POA: Insufficient documentation

## 2022-07-24 DIAGNOSIS — I10 Essential (primary) hypertension: Secondary | ICD-10-CM | POA: Diagnosis not present

## 2022-07-24 DIAGNOSIS — X501XXA Overexertion from prolonged static or awkward postures, initial encounter: Secondary | ICD-10-CM | POA: Insufficient documentation

## 2022-07-24 DIAGNOSIS — Z7901 Long term (current) use of anticoagulants: Secondary | ICD-10-CM | POA: Insufficient documentation

## 2022-07-24 DIAGNOSIS — Y9301 Activity, walking, marching and hiking: Secondary | ICD-10-CM | POA: Diagnosis not present

## 2022-07-24 DIAGNOSIS — S99921A Unspecified injury of right foot, initial encounter: Secondary | ICD-10-CM | POA: Diagnosis not present

## 2022-07-24 DIAGNOSIS — Z79899 Other long term (current) drug therapy: Secondary | ICD-10-CM | POA: Insufficient documentation

## 2022-07-24 DIAGNOSIS — S92354A Nondisplaced fracture of fifth metatarsal bone, right foot, initial encounter for closed fracture: Secondary | ICD-10-CM | POA: Diagnosis not present

## 2022-07-24 DIAGNOSIS — M79671 Pain in right foot: Secondary | ICD-10-CM | POA: Diagnosis present

## 2022-07-24 NOTE — Discharge Instructions (Signed)
The workup today was overall consistent with fracture of the right side of your right foot.  As discussed, wear walker boot until follow-up with orthopedics.  Attached number for orthopedics to call to set up an appointment.  You may take Tylenol as needed for pain.  Rest, ice, elevate affected leg.  Please do not hesitate to return to emergency department for worrisome signs and symptoms we discussed become apparent.

## 2022-07-24 NOTE — ED Provider Notes (Signed)
[facility] Provider Note   CSN: [id number] Arrival date & time: 07/24/22  1125     History  Chief Complaint  Patient presents with   Foot Pain    Kelly Walker is a 77 y.o. female.   Foot Pain   77 year old female presents emergency department complaints of right lateral foot pain.  Patient states that she was walking yesterday afternoon and rolled her right foot when she missed stepped on a brick pathway.  She reports catching her fall with no trauma to head, loss of consciousness.  She is only complaining of right foot pain that is worsened with movement as well as applying pressure.  States she has been able to walk since incident but with pain.  Denies any known cuts/bleeding from affected area.  Has taken no medication for this.  Denies any weakness or sensory deficits in affected foot.  Past medical history significant for atrial fibrillation on Eliquis, hyperlipidemia, diverticulosis, SBO,  Home Medications Prior to Admission medications   Medication Sig Start Date End Date Taking? Authorizing Provider  acetaminophen (TYLENOL) 650 MG CR tablet Take 650 mg by mouth every 8 (eight) hours as needed for pain.    [provider]  diltiazem (CARDIZEM SR) 120 MG 12 hr capsule Take 120 mg by mouth daily. 09/05/21   [provider]  ELIQUIS 5 MG TABS tablet TAKE 1 TABLET BY MOUTH TWICE DAILY 02/08/19   Sherran Needs, NP  flecainide (TAMBOCOR) 100 MG tablet Take 100 mg by mouth 2 (two) times daily.    [provider]  metoprolol succinate (TOPROL-XL) 25 MG 24 hr tablet Take 25 mg by mouth daily. 07/06/21   [provider]  metoprolol tartrate (LOPRESSOR) 25 MG tablet Take 0.5 tablets (12.5 mg total) by mouth 2 (two) times daily. Patient taking differently: Take 12.5 mg by mouth daily as needed (afib). 11/11/17   Sherran Needs, NP  ondansetron (ZOFRAN-ODT) 4 MG disintegrating tablet Take 4 mg by mouth 3  (three) times daily as needed for nausea or vomiting. 09/06/21   [provider]  vitamin B-12 (CYANOCOBALAMIN) 1000 MCG tablet Take 1,000 mcg by mouth daily.    [provider]      Allergies    Lecithin, Other, Potassium nitrate, Sulfa antibiotics, Sulfasalazine, Ciprofloxacin, and Flagyl [metronidazole]    Review of Systems   Review of Systems  All other systems reviewed and are negative.   Physical Exam Updated Vital Signs BP (!) 146/87 (BP Location: Right Arm)   Pulse 65   Temp 98.3 F (36.8 C)   Resp 18   SpO2 97%  Physical Exam Vitals and nursing note reviewed.  Constitutional:      General: She is not in acute distress.    Appearance: She is well-developed.  HENT:     Head: Normocephalic and atraumatic.  Eyes:     Conjunctiva/sclera: Conjunctivae normal.  Cardiovascular:     Rate and Rhythm: Normal rate and regular rhythm.     Heart sounds: No murmur heard. Pulmonary:     Effort: Pulmonary effort is normal. No respiratory distress.     Breath sounds: Normal breath sounds.  Abdominal:     Palpations: Abdomen is soft.     Tenderness: There is no abdominal tenderness.  Musculoskeletal:        General: No swelling.     Cervical back: Neck supple.     Comments: Patient is full range of motion of bilateral  ankles, digits.  Tender palpation along fifth metatarsal with no obvious crepitus.  No overlying skin abnormalities appreciated.  Pedal pulses 2+ bilaterally.  No sensory deficits along major nerve distributions of lower extremities.  No tender to palpation of right hip or right knee.  Skin:    General: Skin is warm and dry.     Capillary Refill: Capillary refill takes less than 2 seconds.  Neurological:     Mental Status: She is alert.  Psychiatric:        Mood and Affect: Mood normal.     ED Results / Procedures / Treatments   Labs (all labs ordered are listed, but only abnormal results are displayed) Labs Reviewed - No data to  display  EKG None  Radiology DG Foot Complete Right  Result Date: 07/24/2022 CLINICAL DATA:  Injury EXAM: RIGHT FOOT COMPLETE - 3 VIEW COMPARISON:  None Available. FINDINGS: Oblique lucency of the distal diaphysis of the fifth metatarsal seen only on AP view, concerning for nondisplaced fracture. Severe degenerative changes of the midfoot. Heterotopic ossification of the Achilles tendon. Diffuse demineralization. Soft tissues are unremarkable. IMPRESSION: Oblique lucency of the distal diaphysis of the fifth metatarsal seen only on AP view, concerning for nondisplaced fracture. Correlate for point tenderness. Electronically Signed   By: Yetta Glassman M.D.   On: 07/24/2022 12:06    Procedures Procedures    Medications Ordered in ED Medications - No data to display  ED Course/ Medical Decision Making/ A&P                             Medical Decision Making Amount and/or Complexity of Data Reviewed Radiology: ordered.   This patient presents to the ED for concern of foot pain, this involves an extensive number of treatment options, and is a complaint that carries with it a high risk of complications and morbidity.  The differential diagnosis includes fracture, strain/sprain, dislocation, neurovascular compromise, ischemic limb, DVT, compartment syndrome   Co morbidities that complicate the patient evaluation  See HPI   Additional history obtained:  Additional history obtained from EMR External records from outside source obtained and reviewed including hospital records   Lab Tests:  N/a   Imaging Studies ordered:  I ordered imaging studies including right foot x-ray I independently visualized and interpreted imaging which showed oblique lucency of the distal diaphysis of the fifth metatarsal concerning for nondisplaced fracture. I agree with the radiologist interpretation  Cardiac Monitoring: / EKG:  The patient was maintained on a cardiac monitor.  I personally  viewed and interpreted the cardiac monitored which showed an underlying rhythm of: Sinus rhythm   Consultations Obtained:  N/a   Problem List / ED Course / Critical interventions / Medication management  Right foot pain/fifth metatarsal fracture Reevaluation of the patient showed that the patient stayed the same I have reviewed the patients home medicines and have made adjustments as needed   Social Determinants of Health:  Former cigarette use.   Test / Admission - Considered:  Right foot pain/fifth metatarsal fracture Vitals signs significant for mild hypertension with a blood pressure 146/87.  Recommend follow-up with primary care regarding elevation blood pressure. Otherwise within normal range and stable throughout visit. Imaging studies significant for: See above Patient with evidence of distal fifth metatarsal fracture.  Fracture well aligned.  Patient placed in cam walker boot and recommended close follow-up with orthopedics outpatient.  Patient recommended rest, ice, elevation affected extremity  with NSAIDs to take as needed for pain/inflammation.  Follow-up information given for orthopedics.  Treatment plan discussed at length with patient and she acknowledged understanding was agreeable to said plan. Worrisome signs and symptoms were discussed with the patient, and the patient acknowledged understanding to return to the ED if noticed. Patient was stable upon discharge.          Final Clinical Impression(s) / ED Diagnoses Final diagnoses:  Closed nondisplaced fracture of fifth metatarsal bone of right foot, initial encounter    Rx / DC Orders ED Discharge Orders     None         Wilnette Kales, [place] 07/24/22 [place], [place], DO 07/24/22 1607

## 2022-07-24 NOTE — ED Triage Notes (Signed)
She c/o mis stepping yesterday. She c/o pain and ecchymosis of lat. Right foot and denies any other pain nor injury from this event.

## 2022-07-29 DIAGNOSIS — E559 Vitamin D deficiency, unspecified: Secondary | ICD-10-CM | POA: Diagnosis not present

## 2022-08-07 DIAGNOSIS — X500XXA Overexertion from strenuous movement or load, initial encounter: Secondary | ICD-10-CM | POA: Diagnosis not present

## 2022-08-07 DIAGNOSIS — M79671 Pain in right foot: Secondary | ICD-10-CM | POA: Diagnosis not present

## 2022-08-07 DIAGNOSIS — S92301A Fracture of unspecified metatarsal bone(s), right foot, initial encounter for closed fracture: Secondary | ICD-10-CM | POA: Diagnosis not present

## 2022-08-07 DIAGNOSIS — M7731 Calcaneal spur, right foot: Secondary | ICD-10-CM | POA: Diagnosis not present

## 2022-08-07 DIAGNOSIS — M19071 Primary osteoarthritis, right ankle and foot: Secondary | ICD-10-CM | POA: Diagnosis not present

## 2022-08-20 DIAGNOSIS — K5730 Diverticulosis of large intestine without perforation or abscess without bleeding: Secondary | ICD-10-CM | POA: Diagnosis not present

## 2022-08-20 DIAGNOSIS — D125 Benign neoplasm of sigmoid colon: Secondary | ICD-10-CM | POA: Diagnosis not present

## 2022-08-20 DIAGNOSIS — I10 Essential (primary) hypertension: Secondary | ICD-10-CM | POA: Diagnosis not present

## 2022-08-20 DIAGNOSIS — Z86010 Personal history of colonic polyps: Secondary | ICD-10-CM | POA: Diagnosis not present

## 2022-08-20 DIAGNOSIS — Z1211 Encounter for screening for malignant neoplasm of colon: Secondary | ICD-10-CM | POA: Diagnosis not present

## 2022-08-20 DIAGNOSIS — D124 Benign neoplasm of descending colon: Secondary | ICD-10-CM | POA: Diagnosis not present

## 2022-08-20 DIAGNOSIS — K640 First degree hemorrhoids: Secondary | ICD-10-CM | POA: Diagnosis not present

## 2022-08-20 DIAGNOSIS — D123 Benign neoplasm of transverse colon: Secondary | ICD-10-CM | POA: Diagnosis not present

## 2022-09-04 DIAGNOSIS — X500XXD Overexertion from strenuous movement or load, subsequent encounter: Secondary | ICD-10-CM | POA: Diagnosis not present

## 2022-09-04 DIAGNOSIS — S92301D Fracture of unspecified metatarsal bone(s), right foot, subsequent encounter for fracture with routine healing: Secondary | ICD-10-CM | POA: Diagnosis not present

## 2022-09-04 DIAGNOSIS — S92354D Nondisplaced fracture of fifth metatarsal bone, right foot, subsequent encounter for fracture with routine healing: Secondary | ICD-10-CM | POA: Diagnosis not present

## 2022-09-19 DIAGNOSIS — M1712 Unilateral primary osteoarthritis, left knee: Secondary | ICD-10-CM | POA: Diagnosis not present

## 2022-09-25 DIAGNOSIS — L989 Disorder of the skin and subcutaneous tissue, unspecified: Secondary | ICD-10-CM | POA: Diagnosis not present

## 2022-10-01 DIAGNOSIS — D485 Neoplasm of uncertain behavior of skin: Secondary | ICD-10-CM | POA: Diagnosis not present

## 2022-10-01 DIAGNOSIS — C4441 Basal cell carcinoma of skin of scalp and neck: Secondary | ICD-10-CM | POA: Diagnosis not present

## 2022-10-18 DIAGNOSIS — C4441 Basal cell carcinoma of skin of scalp and neck: Secondary | ICD-10-CM | POA: Diagnosis not present

## 2022-11-07 DIAGNOSIS — Z5181 Encounter for therapeutic drug level monitoring: Secondary | ICD-10-CM | POA: Diagnosis not present

## 2022-11-07 DIAGNOSIS — Z79899 Other long term (current) drug therapy: Secondary | ICD-10-CM | POA: Diagnosis not present

## 2022-11-07 DIAGNOSIS — I480 Paroxysmal atrial fibrillation: Secondary | ICD-10-CM | POA: Diagnosis not present

## 2022-11-08 DIAGNOSIS — D225 Melanocytic nevi of trunk: Secondary | ICD-10-CM | POA: Diagnosis not present

## 2022-11-08 DIAGNOSIS — Z08 Encounter for follow-up examination after completed treatment for malignant neoplasm: Secondary | ICD-10-CM | POA: Diagnosis not present

## 2022-11-08 DIAGNOSIS — L821 Other seborrheic keratosis: Secondary | ICD-10-CM | POA: Diagnosis not present

## 2022-11-08 DIAGNOSIS — L814 Other melanin hyperpigmentation: Secondary | ICD-10-CM | POA: Diagnosis not present

## 2022-11-08 DIAGNOSIS — Z85828 Personal history of other malignant neoplasm of skin: Secondary | ICD-10-CM | POA: Diagnosis not present

## 2023-04-21 DIAGNOSIS — E559 Vitamin D deficiency, unspecified: Secondary | ICD-10-CM | POA: Diagnosis not present

## 2023-04-21 DIAGNOSIS — R7303 Prediabetes: Secondary | ICD-10-CM | POA: Diagnosis not present

## 2023-04-21 DIAGNOSIS — E7800 Pure hypercholesterolemia, unspecified: Secondary | ICD-10-CM | POA: Diagnosis not present

## 2023-04-21 DIAGNOSIS — Z79899 Other long term (current) drug therapy: Secondary | ICD-10-CM | POA: Diagnosis not present

## 2023-04-23 DIAGNOSIS — Z79899 Other long term (current) drug therapy: Secondary | ICD-10-CM | POA: Diagnosis not present

## 2023-04-23 DIAGNOSIS — R7303 Prediabetes: Secondary | ICD-10-CM | POA: Diagnosis not present

## 2023-04-23 DIAGNOSIS — Z5181 Encounter for therapeutic drug level monitoring: Secondary | ICD-10-CM | POA: Diagnosis not present

## 2023-04-23 DIAGNOSIS — E559 Vitamin D deficiency, unspecified: Secondary | ICD-10-CM | POA: Diagnosis not present

## 2023-04-23 DIAGNOSIS — I480 Paroxysmal atrial fibrillation: Secondary | ICD-10-CM | POA: Diagnosis not present

## 2023-04-23 DIAGNOSIS — I700 Atherosclerosis of aorta: Secondary | ICD-10-CM | POA: Diagnosis not present

## 2023-04-23 DIAGNOSIS — E7800 Pure hypercholesterolemia, unspecified: Secondary | ICD-10-CM | POA: Diagnosis not present

## 2023-04-23 DIAGNOSIS — D6869 Other thrombophilia: Secondary | ICD-10-CM | POA: Diagnosis not present

## 2023-04-23 DIAGNOSIS — Z0000 Encounter for general adult medical examination without abnormal findings: Secondary | ICD-10-CM | POA: Diagnosis not present

## 2023-05-01 DIAGNOSIS — Z5181 Encounter for therapeutic drug level monitoring: Secondary | ICD-10-CM | POA: Diagnosis not present

## 2023-05-01 DIAGNOSIS — I480 Paroxysmal atrial fibrillation: Secondary | ICD-10-CM | POA: Diagnosis not present

## 2023-05-01 DIAGNOSIS — Z79899 Other long term (current) drug therapy: Secondary | ICD-10-CM | POA: Diagnosis not present

## 2023-05-12 DIAGNOSIS — Z08 Encounter for follow-up examination after completed treatment for malignant neoplasm: Secondary | ICD-10-CM | POA: Diagnosis not present

## 2023-05-12 DIAGNOSIS — L821 Other seborrheic keratosis: Secondary | ICD-10-CM | POA: Diagnosis not present

## 2023-05-12 DIAGNOSIS — L814 Other melanin hyperpigmentation: Secondary | ICD-10-CM | POA: Diagnosis not present

## 2023-05-12 DIAGNOSIS — Z85828 Personal history of other malignant neoplasm of skin: Secondary | ICD-10-CM | POA: Diagnosis not present

## 2023-05-12 DIAGNOSIS — D225 Melanocytic nevi of trunk: Secondary | ICD-10-CM | POA: Diagnosis not present

## 2023-05-27 IMAGING — MG MM DIGITAL DIAGNOSTIC UNILAT*L* W/ TOMO W/ CAD
6 of 10 series · 6 of 30 positions shown · non-contrast
Comparison: Previous exams including recent screening mammogram
dated 10/02/2020.

CLINICAL DATA: Patient returns today to evaluate a possible LEFT
breast distortion questioned on recent screening mammogram.

EXAM:
DIGITAL DIAGNOSTIC UNILATERAL LEFT MAMMOGRAM WITH TOMOSYNTHESIS AND
CAD
TECHNIQUE: Left digital diagnostic mammography and breast tomosynthesis was
performed. The images were evaluated with computer-aided detection.

[L MLO synth-2D (1 of 2)]
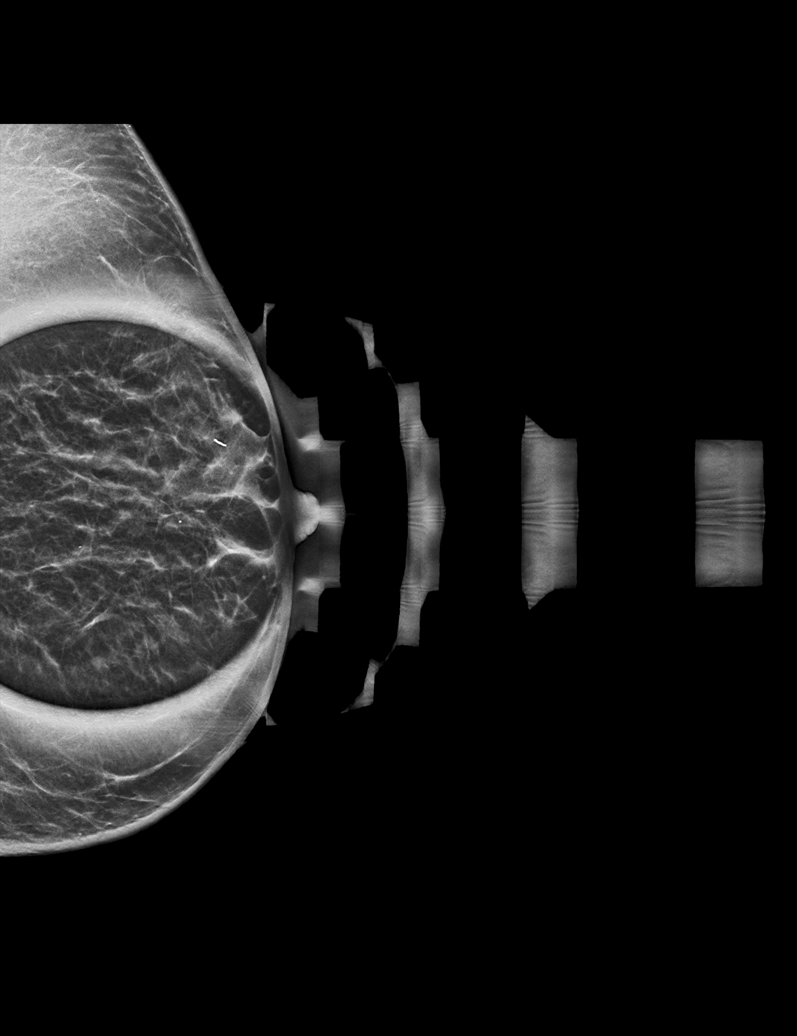

[L CC synth-2D]
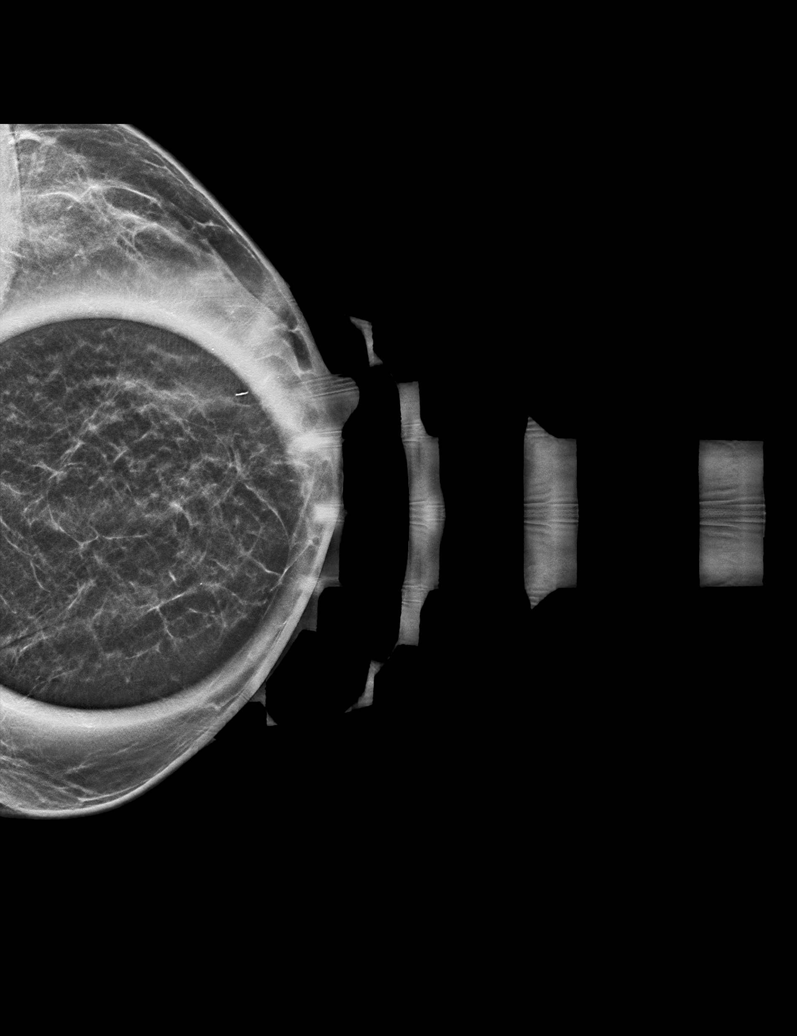

[L XCCM synth-2D]
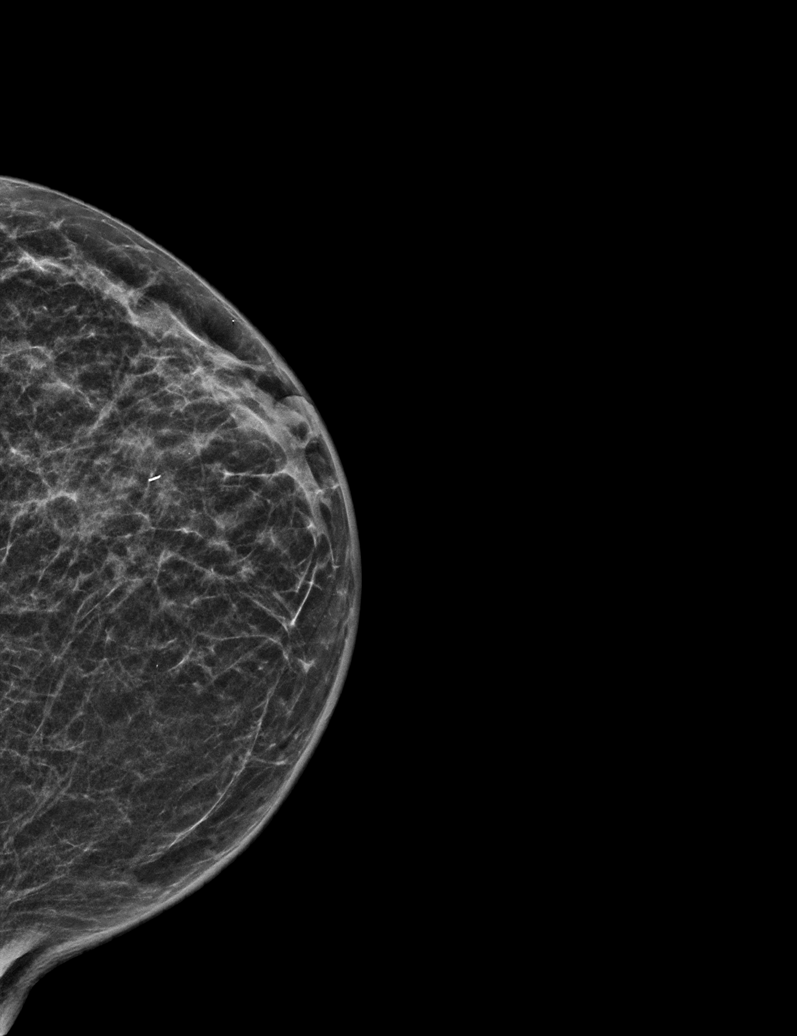

[L ML synth-2D]
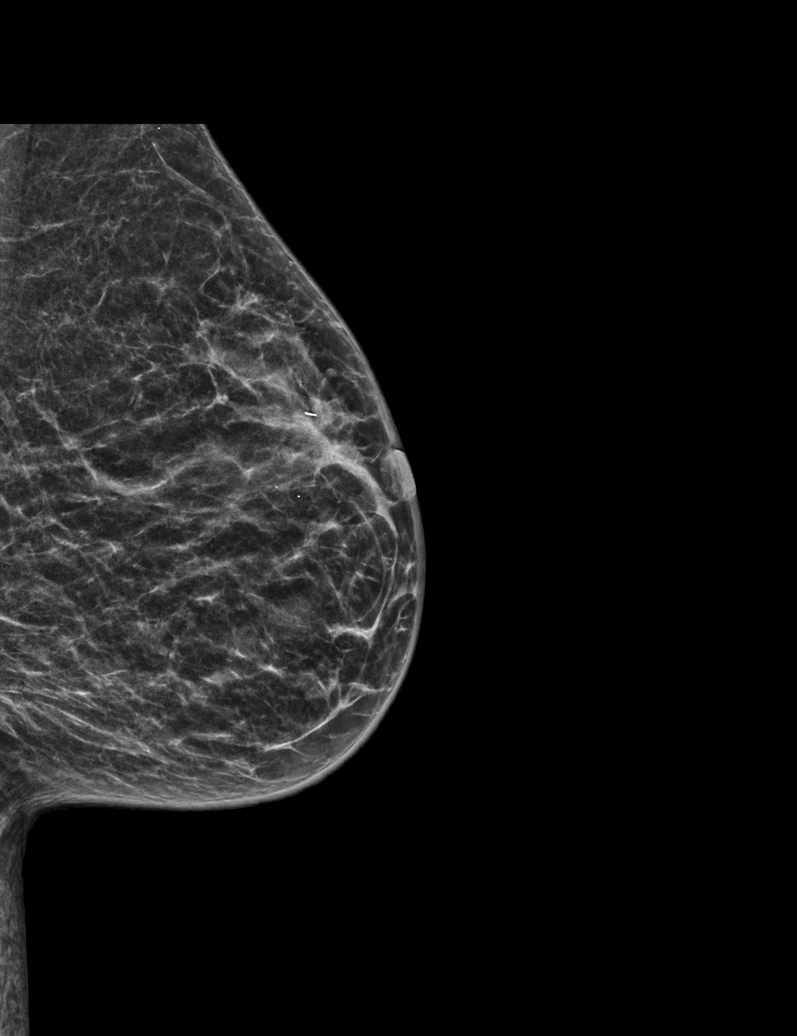

[L MLO synth-2D (2 of 2)]
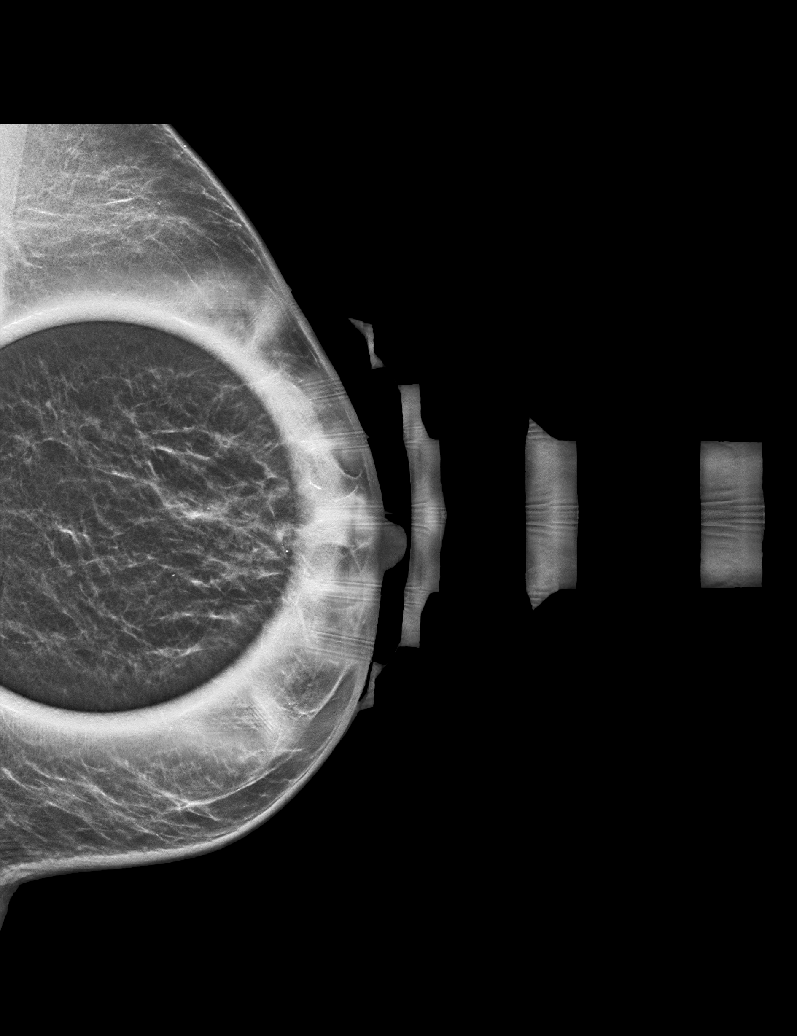

[L MLO tomo · tomo slice 17/34.0]
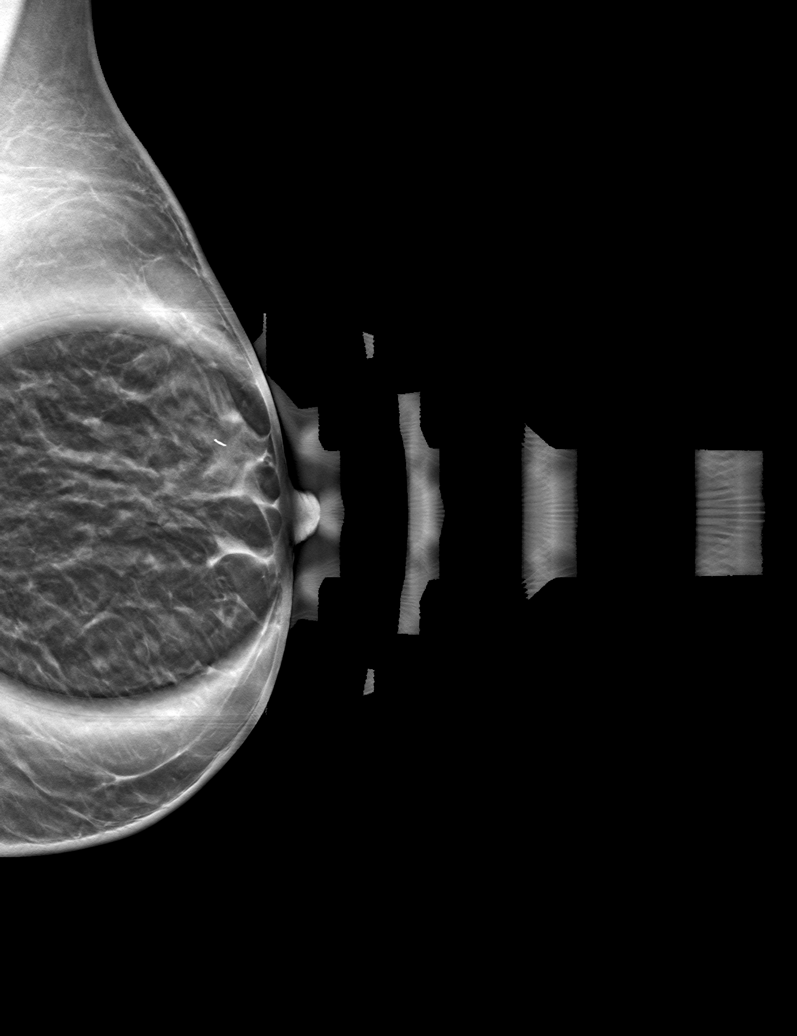

[6 of 30 positions shown; findings below may reference images not displayed]

ACR Breast Density Category b: There are scattered areas of
fibroglandular density.
FINDINGS: On today's additional diagnostic views, including spot compression
views with 3D tomosynthesis and exaggerated medial CC view, there is
no persistent abnormality within the LEFT breast indicating
superimposition of normal fibroglandular tissues.
IMPRESSION: No evidence of malignancy.

Patient may return to routine annual bilateral screening mammogram
schedule.

RECOMMENDATION:
Screening mammogram in one year.(Code:LD-0-WYK)

I have discussed the findings and recommendations with the patient.
If applicable, a reminder letter will be sent to the patient
regarding the next appointment.

BI-RADS CATEGORY  1: Negative.

## 2023-06-19 DIAGNOSIS — M1710 Unilateral primary osteoarthritis, unspecified knee: Secondary | ICD-10-CM | POA: Diagnosis not present

## 2023-06-19 DIAGNOSIS — M1712 Unilateral primary osteoarthritis, left knee: Secondary | ICD-10-CM | POA: Diagnosis not present

## 2023-08-21 DIAGNOSIS — H0102A Squamous blepharitis right eye, upper and lower eyelids: Secondary | ICD-10-CM | POA: Diagnosis not present

## 2023-08-21 DIAGNOSIS — H04123 Dry eye syndrome of bilateral lacrimal glands: Secondary | ICD-10-CM | POA: Diagnosis not present

## 2023-08-21 DIAGNOSIS — H0102B Squamous blepharitis left eye, upper and lower eyelids: Secondary | ICD-10-CM | POA: Diagnosis not present

## 2023-08-21 DIAGNOSIS — Z961 Presence of intraocular lens: Secondary | ICD-10-CM | POA: Diagnosis not present

## 2023-09-30 ENCOUNTER — Ambulatory Visit (HOSPITAL_BASED_OUTPATIENT_CLINIC_OR_DEPARTMENT_OTHER)

## 2023-09-30 ENCOUNTER — Ambulatory Visit (HOSPITAL_BASED_OUTPATIENT_CLINIC_OR_DEPARTMENT_OTHER): Admitting: Student

## 2023-09-30 ENCOUNTER — Encounter (HOSPITAL_BASED_OUTPATIENT_CLINIC_OR_DEPARTMENT_OTHER): Payer: Self-pay | Admitting: Student

## 2023-09-30 DIAGNOSIS — M5441 Lumbago with sciatica, right side: Secondary | ICD-10-CM

## 2023-09-30 DIAGNOSIS — M47816 Spondylosis without myelopathy or radiculopathy, lumbar region: Secondary | ICD-10-CM | POA: Diagnosis not present

## 2023-09-30 DIAGNOSIS — M25551 Pain in right hip: Secondary | ICD-10-CM

## 2023-09-30 DIAGNOSIS — M160 Bilateral primary osteoarthritis of hip: Secondary | ICD-10-CM | POA: Diagnosis not present

## 2023-09-30 DIAGNOSIS — M549 Dorsalgia, unspecified: Secondary | ICD-10-CM | POA: Diagnosis not present

## 2023-09-30 DIAGNOSIS — M25559 Pain in unspecified hip: Secondary | ICD-10-CM | POA: Diagnosis not present

## 2023-09-30 MED ORDER — METHYLPREDNISOLONE 4 MG PO TBPK: ORAL_TABLET | ORAL | 0 refills | Status: AC

## 2023-09-30 NOTE — Progress Notes (Signed)
 Chief Complaint: Right hip pain    Discussed the use of AI scribe software for clinical note transcription with the patient, who gave verbal consent to proceed.  History of Present Illness Kelly Walker is a 78 year old female who presents with right hip pain that started a few days ago. She attributes the onset of her pain to heavy lifting before Easter.  She also was recently immobilized in a walking boot and postop shoe due to a foot fracture, which affected her gait.  The pain radiates down the outside of her hip and into her leg, becoming more severe with prolonged standing. She also experiences shooting pains that extend down to her right shin.  She denies any groin pain, numbness, or tingling.  Kelly Walker has been managing her pain with Tylenol  arthritis, but it provides little relief. She also takes blood thinners.  She has been using a cane for ambulation.  Kelly Walker is due to travel to [place] soon and is seeking relief from her pain.   Surgical History:   None  PMH/PSH/Family History/Social History/Meds/Allergies:    Past Medical History:  Diagnosis Date   Atrial fibrillation (HCC)    on Eliquis    Diverticulosis    Gout    Grade I diastolic dysfunction    Hyperlipidemia    Menopause    SBO (small bowel obstruction) (HCC) 11/06/2012   Past Surgical History:  Procedure Laterality Date   ABDOMINAL HYSTERECTOMY  2001   APPENDECTOMY  2001   CARDIOVERSION N/A 11/07/2017   Procedure: CARDIOVERSION;  Surgeon: Darlis Eisenmenger, MD;  Location: [facility] ENDOSCOPY;  Service: Cardiovascular;  Laterality: N/A;   CATARACT EXTRACTION Bilateral    TONSILLECTOMY  1957   UMBILICAL HERNIA REPAIR  2001   Social History   Socioeconomic History   Marital status: Married    Spouse name: Not on file   Number of children: Not on file   Years of education: Not on file   Highest education level: Not on file  Occupational History   Not on file  Tobacco Use   Smoking status:  Former    Current packs/day: 0.00    Average packs/day: 1 pack/day for 4.0 years (4.0 ttl pk-yrs)    Types: Cigarettes    Start date: 06/10/1968    Quit date: 06/10/1972    Years since quitting: 51.3   Smokeless tobacco: Never  Vaping Use   Vaping status: Never Used  Substance and Sexual Activity   Alcohol use: Yes    Comment: occasional-social   Drug use: No   Sexual activity: Yes  Other Topics Concern   Not on file  Social History Narrative   Not on file   Social Drivers of Health   Financial Resource Strain: Not on file  Food Insecurity: Not on file  Transportation Needs: Not on file  Physical Activity: Not on file  Stress: Not on file  Social Connections: Not on file   Family History  Problem Relation Age of Onset   Heart failure Mother    Diabetes Mother    Heart attack Father    Colon cancer Father 58   Colon cancer Paternal Uncle 74   Stomach cancer Cousin    Allergies  Allergen Reactions   Lecithin Other (See Comments)    Intestinal distress   Other  Fluoroquinolones Antibiotics=increased heartbeat,fatigue,lack of focus    Potassium Nitrate Other (See Comments)    & Potassium Nitrite=Increased heart rate,disorientation    Sulfa Antibiotics     Joint pain   Sulfasalazine Other (See Comments)    Joint pain   Ciprofloxacin Anxiety   Flagyl [Metronidazole] Anxiety   Current Outpatient Medications  Medication Sig Dispense Refill   methylPREDNISolone  (MEDROL  DOSEPAK) 4 MG TBPK tablet Take per packet instructions 1 each 0   acetaminophen  (TYLENOL ) 650 MG CR tablet Take 650 mg by mouth every 8 (eight) hours as needed for pain.     diltiazem  (CARDIZEM  SR) 120 MG 12 hr capsule Take 120 mg by mouth daily.     ELIQUIS  5 MG TABS tablet TAKE 1 TABLET BY MOUTH TWICE DAILY 60 tablet 3   flecainide  (TAMBOCOR ) 100 MG tablet Take 100 mg by mouth 2 (two) times daily.     metoprolol  succinate (TOPROL -XL) 25 MG 24 hr tablet Take 25 mg by mouth daily.     metoprolol   tartrate (LOPRESSOR ) 25 MG tablet Take 0.5 tablets (12.5 mg total) by mouth 2 (two) times daily. (Patient taking differently: Take 12.5 mg by mouth daily as needed (afib).) 180 tablet 2   ondansetron  (ZOFRAN -ODT) 4 MG disintegrating tablet Take 4 mg by mouth 3 (three) times daily as needed for nausea or vomiting.     vitamin B-12 (CYANOCOBALAMIN) 1000 MCG tablet Take 1,000 mcg by mouth daily.     No current facility-administered medications for this visit.   No results found.  Review of Systems:   A ROS was performed including pertinent positives and negatives as documented in the HPI.  Physical Exam :   Constitutional: NAD and appears stated age Neurological: Alert and oriented Psych: Appropriate affect and cooperative There were no vitals taken for this visit.   Comprehensive Musculoskeletal Exam:    Tenderness with palpation in the right lumbar region extending into the posterior hip.  Mild soreness directly over the right greater trochanter.  Passive right hip range of motion to 110 degrees flexion, 20 degrees external rotation, and minimal internal rotation.  Knee flexion/extension and ankle dorsiflexion/plantar strength is 5/5.  Imaging:   Xray (lumbar spine 4 views): Multilevel intervertebral disc space narrowing, which appears most significant at L5-S1.  Anterior osteophytes and facet arthropathy most noted between L4 and S1.  Negative for acute bony abnormality.  Xray (AP pelvis, right hip 3 views): Advanced bilateral hip osteoarthritis with degenerative calcifications noted of the right greater trochanter.   I personally reviewed and interpreted the radiographs.      Assessment & Plan Right hip pain   Pain is likely from the lumbar spine rather than the hip joint, with previous foot fracture and gait issues from walking boot possibly contributing. Initiate physical therapy to improve function and reduce symptoms. Prescribe a short course of oral steroids (prednisone).  Refer to physical therapy to hopefully begin before travel and provide exercises for travel. Advise her to report any changes.if pain persist post travel, schedule follow-up for reassessment.  Degenerative changes in lumbar spine   Degenerative changes at L4-L5 and L5-S1 are likely the source of right hip pain and leg radiation, with no acute neurological deficits. Initiate physical therapy to strengthen muscles and improve spinal function. Consider aquatic therapy for low-impact exercise. Prescribe a short course of oral steroids (Medrol ).  Advanced bilateral hip osteoarthritis Moderate to severe arthritis with joint space narrowing, osteophytes, and sclerosis is present, but current symptoms are not from hip  arthritis. Monitor for hip pain development and consider interventions such as cortisone injection should this develop.       I personally saw and evaluated the patient, and participated in the management and treatment plan.  Sharrell Deck, PA-C Orthopedics

## 2023-10-09 ENCOUNTER — Encounter (HOSPITAL_BASED_OUTPATIENT_CLINIC_OR_DEPARTMENT_OTHER): Payer: Self-pay

## 2023-10-09 ENCOUNTER — Telehealth (HOSPITAL_BASED_OUTPATIENT_CLINIC_OR_DEPARTMENT_OTHER): Payer: Self-pay | Admitting: Student

## 2023-10-09 NOTE — Telephone Encounter (Signed)
 Patient states that she went to a spine clinc and would like her x-rays in her mychart

## 2023-10-10 DIAGNOSIS — M5416 Radiculopathy, lumbar region: Secondary | ICD-10-CM | POA: Diagnosis not present

## 2023-10-16 ENCOUNTER — Ambulatory Visit: Admitting: Physical Therapy

## 2023-10-16 ENCOUNTER — Other Ambulatory Visit: Payer: Self-pay

## 2023-10-16 DIAGNOSIS — M5417 Radiculopathy, lumbosacral region: Secondary | ICD-10-CM

## 2023-10-16 DIAGNOSIS — M25551 Pain in right hip: Secondary | ICD-10-CM

## 2023-10-16 DIAGNOSIS — M6281 Muscle weakness (generalized): Secondary | ICD-10-CM | POA: Diagnosis not present

## 2023-10-16 DIAGNOSIS — R2689 Other abnormalities of gait and mobility: Secondary | ICD-10-CM

## 2023-10-16 NOTE — Therapy (Signed)
 OUTPATIENT PHYSICAL THERAPY THORACOLUMBAR EVALUATION   Patient Name: Kelly Walker MRN: [id number] DOB:Jan 21, 1946, 78 y.o., female Today's Date: 10/16/2023  END OF SESSION:  PT End of Session - 10/16/23 1107     Visit Number 1    Number of Visits 16    Date for PT Re-Evaluation 12/11/23    Authorization Type [employer]    Progress Note Due on Visit 10    PT Start Time 1105    PT Stop Time 1145    PT Time Calculation (min) 40 min             Past Medical History:  Diagnosis Date   Atrial fibrillation (HCC)    on Eliquis    Diverticulosis    Gout    Grade I diastolic dysfunction    Hyperlipidemia    Menopause    SBO (small bowel obstruction) (HCC) 11/06/2012   Past Surgical History:  Procedure Laterality Date   ABDOMINAL HYSTERECTOMY  2001   APPENDECTOMY  2001   CARDIOVERSION N/A 11/07/2017   Procedure: CARDIOVERSION;  Surgeon: Darlis Eisenmenger, MD;  Location: [facility] ENDOSCOPY;  Service: Cardiovascular;  Laterality: N/A;   CATARACT EXTRACTION Bilateral    TONSILLECTOMY  1957   UMBILICAL HERNIA REPAIR  2001   Patient Active Problem List   Diagnosis Date Noted   Hypophosphatemia 09/11/2021   Epistaxis 09/11/2021   Hypokalemia 09/10/2021   Hyperglycemia 09/08/2021   Atrial fibrillation with RVR (HCC) 11/06/2017   HLD (hyperlipidemia) 11/06/2017   Paroxysmal atrial fibrillation (HCC) 10/23/2017   Pain in finger of left hand 09/20/2016   Trigger finger, left ring finger 09/20/2016   DJD (degenerative joint disease), multiple sites 03/20/2014   History of bone density study 04/19/2013   Estrogen deficiency 02/18/2013   Elevated uric acid in blood 02/18/2013   History of colonic polyps 02/18/2013   SBO (small bowel obstruction) (HCC) 11/06/2012   DJD (degenerative joint disease) 08/06/2011   Obesity 08/06/2011   Family history of colon cancer requiring screening colonoscopy 08/06/2011   Elevated blood pressure 07/24/2011   Hyperlipidemia    Menopause     Diverticulosis     PCP: Olin Bertin, MD  REFERRING PROVIDER: Amedeo Jupiter, PA-C  REFERRING DIAG: M54.41 (ICD-10-CM) - Acute right-sided low back pain with right-sided sciatica M25.551 (ICD-10-CM) - Pain of right hip  Rationale for Evaluation and Treatment: Rehabilitation  THERAPY DIAG:  Pain in right hip  Muscle weakness (generalized)  Other abnormalities of gait and mobility  Radiculopathy, lumbosacral region  ONSET DATE: Easter 2025  SUBJECTIVE:  SUBJECTIVE STATEMENT: Pt states it started the day before Easter. Had to clean up outside/around the patio and then was not able to move Easter morning. Went to urgent care at [facility] and took x-rays. Was given 6 days of steroids but didn't get much relief. Saw [facility] and was set up to see the spine center. Pt was told the last 3 vertebrae are compressed and bottom one pushed sideways and pushing into nerve. Will be seeing them again for recheck on 11/14/23. Has tickets to go to [place] 10/25/23. Was given Gabapentin and has been helping. Pt has been using cane for safety but typically does not use it.   PERTINENT HISTORY:  Prior R foot fracture (had boot),   PAIN:  Are you having pain? Yes: NPRS scale: at rest currently 0, but can feel pain with prolonged sitting. At worst 6 or 7/10  Pain location: R posterior hip and radiates to knee Pain description: Gets sharper the longer it goes on Aggravating factors: Prolonged standing, too much walking/fatigue Relieving factors: Walking/moving loosens it up, pt feels better with feet up on pillows when sleeping  PRECAUTIONS: None  RED FLAGS: None   WEIGHT BEARING RESTRICTIONS: No  FALLS:  Has patient fallen in last 6 months? No  LIVING ENVIRONMENT: Lives with: lives with their spouse Lives  in: House/apartment Stairs: Yes: Internal: but doesn't have to use steps;   and External: 6 steps; on left going up Has following equipment at home: Single point cane  OCCUPATION: Retired -- lots of walks with dogs (up to 4 miles)  PLOF: Independent  PATIENT GOALS: Improve pain to be able to go to [place] and return to her normal activities  NEXT MD VISIT: n/a  OBJECTIVE:  Note: Objective measures were completed at Evaluation unless otherwise noted.  DIAGNOSTIC FINDINGS:  09/30/23 R hip x-ray IMPRESSION: No acute abnormality noted.  09/30/23 lumbar x-ray FINDINGS: There is no evidence of lumbar spine fracture. Dextrocurvature of spine. Narrow intervertebral spaces, anterior spurring and facet joint sclerosis are identified throughout lumbar spine more prominently involving the lower lumbar spine.  PATIENT SURVEYS:  The Patient-Specific Functional Scale  Initial:  I am going to ask you to identify up to 3 important activities that you are unable to do or are having difficulty with as a result of this problem.  Today are there any activities that you are unable to do or having difficulty with because of this?  (Patient shown scale and patient rated each activity)  Follow up: When you first came in you had difficulty performing these activities.  Today do you still have difficulty?  Patient-Specific activity scoring scheme (Point to one number):  0 1 2 3 4 5 6 7 8 9  10 Unable                                                                                                          Able to perform To perform  activity at the same Activity         Level as before                                                                                                                       Injury or problem Activity Initial (eval): Follow up:  Walking 4   2.   House work  2   3.   Exercise bike 0    Average 6/3 = 2      COGNITION: Overall cognitive status: Within functional limits for tasks assessed     SENSATION: WFL  MUSCLE LENGTH: Hamstrings: Right = Left and >80 deg Andy Bannister test: Right WNL but feels tight; Left WNL but feels tight  POSTURE: flexed trunk   PALPATION: Increased TTP R SIJ, glute max, glute med, R lumbar paraspinals L hip PROM into ER has firm end feel/joint tightness R hip PROM into ER is more than L but feels like muscle tightness  LUMBAR ROM:   AROM eval  Flexion 100%  Extension 10%  Right lateral flexion 1" above knee  Left lateral flexion 2" above knee with some discomfort  Right rotation 25% a little discomfort  Left rotation 50%   (Blank rows = not tested)  LOWER EXTREMITY ROM:     Active  Right eval Left eval  Hip flexion    Hip extension    Hip abduction    Hip adduction    Hip internal rotation    Hip external rotation    Knee flexion    Knee extension    Ankle dorsiflexion    Ankle plantarflexion    Ankle inversion    Ankle eversion     (Blank rows = not tested)  LOWER EXTREMITY MMT:    MMT Right eval Left eval  Hip flexion 5 5  Hip extension    Hip abduction    Hip adduction    Hip internal rotation    Hip external rotation    Knee flexion 5 4+ (chronic knee pain)  Knee extension 5 4+ (chronic knee pain)  Ankle dorsiflexion    Ankle plantarflexion    Ankle inversion    Ankle eversion     (Blank rows = not tested)  LUMBAR SPECIAL TESTS:  Straight leg raise test: Negative, FABER test: Positive, and Thomas test: Negative Left hip tigher than Right hip with FABER  FUNCTIONAL TESTS:  5 times sit to stand: TBA  GAIT: Distance walked: Into clinic Assistive device utilized: Single point cane Level of assistance: SBA Comments: Guarded, slow, diminished bilat step length, decreased stance time R>L, flexed at hips  TREATMENT DATE: 10/16/23 See HEP Skilled assessment and palpation for TPDN  STM & TPR R sacral  paraspinal and glute max Trigger Point Dry Needling  Initial Treatment: Pt instructed on Dry Needling rational, procedures, and possible side effects. Pt instructed to expect mild to moderate muscle soreness later in the day and/or into the next day.  Pt instructed in  methods to reduce muscle soreness. Pt instructed to continue prescribed HEP. Patient was educated on signs and symptoms of infection and other risk factors and advised to seek medical attention should they occur.  Patient verbalized understanding of these instructions and education.   Patient Verbal Consent Given: Yes Education Handout Provided: Yes Muscles Treated: R sacral paraspinal (near SIJ), R glute max Electrical Stimulation Performed: No Treatment Response/Outcome: Decreased muscle tension    PATIENT EDUCATION:  Education details: Exam findings, initial HEP, POC Person educated: Patient Education method: Explanation, Demonstration, and Handouts Education comprehension: verbalized understanding, returned demonstration, and needs further education  HOME EXERCISE PROGRAM: Access Code: [email or web address] URL: [email or web address] Date: 10/16/2023 Prepared by: Harleen Fineberg April Erman Hayward  Exercises - Supine Lower Trunk Rotation  - 1 x daily - 7 x weekly - 1 sets - 5 reps - 5-10 sec hold - Modified Thomas Stretch  - 1 x daily - 7 x weekly - 1 sets - 2 reps - 30 sec hold - Supine Piriformis Stretch with Foot on Ground  - 1 x daily - 7 x weekly - 1 sets - 2 reps - 30 sec hold  Patient Education - Trigger Point Dry Needling  ASSESSMENT:  CLINICAL IMPRESSION: Patient is a 78 y.o. F who was seen today for physical therapy evaluation and treatment for R hip pain and R lumbar radiculopathy. PMH significant for prior R foot fx (pt was in a boot and then a surgical shoe) and chronic L knee pain. Pt demos slow and guarded motions. Assessment significant for bilat hip and core weakness, bilateral posterior hip  tightness with decreased ranges in rotation and flexion, trigger points and tenderness in R>L glute med/max and lumbosacral paraspinals. Performed trial of TPDN to address her glute tightness. No overt pain with R LE nerve tension testing. Pt will greatly benefit from PT to improve her core and hip strength/mobility to decrease her pain with prolonged standing tasks for home and community mobility.   OBJECTIVE IMPAIRMENTS: Abnormal gait, decreased activity tolerance, decreased balance, decreased endurance, decreased mobility, difficulty walking, decreased ROM, decreased strength, hypomobility, increased fascial restrictions, increased muscle spasms, improper body mechanics, postural dysfunction, and pain.   ACTIVITY LIMITATIONS: standing, squatting, stairs, transfers, bed mobility, and locomotion level  PARTICIPATION LIMITATIONS: meal prep, cleaning, laundry, shopping, and community activity  PERSONAL FACTORS: Age, Fitness, Past/current experiences, and Time since onset of injury/illness/exacerbation are also affecting patient's functional outcome.   REHAB POTENTIAL: Good  CLINICAL DECISION MAKING: Evolving/moderate complexity  EVALUATION COMPLEXITY: Moderate   GOALS: Goals reviewed with patient? Yes  SHORT TERM GOALS: Target date: 11/13/2023   Pt will be ind with initial HEP Baseline: Goal status: INITIAL  2.  Pt will report >/=50% improvement with pain Baseline:  Goal status: INITIAL    LONG TERM GOALS: Target date: 12/11/2023   Pt will be ind with management and progression of HEP Baseline:  Goal status: INITIAL  2.  Pt will be able to tolerate standing at least 20 min without pain Baseline:  Goal status: INITIAL  3.  Pt will have improved PSFS average score to >/=5/10 to demo MCID Baseline:  Goal status: INITIAL  4.  Pt will be able to perform all lumbar ROM without pain for bending and lifting tasks at home and in the community Baseline:  Goal status: INITIAL  5.   Pt will report >/=75% improvement with her pain Baseline:  Goal status: INITIAL    PLAN:  PT FREQUENCY: 2x/week  PT DURATION: 8 weeks  PLANNED INTERVENTIONS: 97164- PT Re-evaluation, 97110-Therapeutic exercises, 97530- Therapeutic activity, [id number]- Neuromuscular re-education, [phone]- Self Care, [phone]- Manual therapy, [phone]- Gait training, [phone]- Aquatic Therapy, [phone]- Electrical stimulation (unattended), [phone]- Traction (mechanical), [id number]- Ionotophoresis 4mg /ml Dexamethasone , Patient/Family education, Balance training, Stair training, Taping, Dry Needling, Joint mobilization, Spinal mobilization, Cryotherapy, and Moist heat.  PLAN FOR NEXT SESSION: Assess/modify/update HEP. Hip stretching/strengthening. Initiate core strengthening. Manual therapy/dry needling as indicated.    Nautica Hotz April Ma L Fayette, PT 10/16/2023, 1:13 PM   Referring diagnosis? M54.41 (ICD-10-CM) - Acute right-sided low back pain with right-sided sciatica M25.551 (ICD-10-CM) - Pain of right hip Treatment diagnosis? (if different than referring diagnosis) M25.551 Pain in right hip, M62.81 Muscle Weakness What was this (referring dx) caused by? []  Surgery []  Fall [x]  Ongoing issue []  Arthritis []  Other: ____________  Laterality: [x]  Rt []  Lt []  Both  Check all possible CPT codes:  *CHOOSE 10 OR LESS*    See Planned Interventions listed in the Plan section of the Evaluation.

## 2023-10-20 ENCOUNTER — Ambulatory Visit: Admitting: Physical Therapy

## 2023-10-20 ENCOUNTER — Encounter: Payer: Self-pay | Admitting: Physical Therapy

## 2023-10-20 DIAGNOSIS — M5417 Radiculopathy, lumbosacral region: Secondary | ICD-10-CM | POA: Diagnosis not present

## 2023-10-20 DIAGNOSIS — R2689 Other abnormalities of gait and mobility: Secondary | ICD-10-CM | POA: Diagnosis not present

## 2023-10-20 DIAGNOSIS — M25551 Pain in right hip: Secondary | ICD-10-CM | POA: Diagnosis not present

## 2023-10-20 DIAGNOSIS — M6281 Muscle weakness (generalized): Secondary | ICD-10-CM | POA: Diagnosis not present

## 2023-10-20 NOTE — Therapy (Signed)
 OUTPATIENT PHYSICAL THERAPY TREATMENT   Patient Name: Kelly Walker MRN: [id number] DOB:1945/07/14, 78 y.o., female Today's Date: 10/20/2023  END OF SESSION:  PT End of Session - 10/20/23 1026     Visit Number 2    Number of Visits 16    Date for PT Re-Evaluation 12/11/23    Authorization Type [employer]    Progress Note Due on Visit 10    PT Start Time 1022    PT Stop Time 1102    PT Time Calculation (min) 40 min    Activity Tolerance Patient tolerated treatment well    Behavior During Therapy WFL for tasks assessed/performed              Past Medical History:  Diagnosis Date   Atrial fibrillation (HCC)    on Eliquis    Diverticulosis    Gout    Grade I diastolic dysfunction    Hyperlipidemia    Menopause    SBO (small bowel obstruction) (HCC) 11/06/2012   Past Surgical History:  Procedure Laterality Date   ABDOMINAL HYSTERECTOMY  2001   APPENDECTOMY  2001   CARDIOVERSION N/A 11/07/2017   Procedure: CARDIOVERSION;  Surgeon: Darlis Eisenmenger, MD;  Location: [facility] ENDOSCOPY;  Service: Cardiovascular;  Laterality: N/A;   CATARACT EXTRACTION Bilateral    TONSILLECTOMY  1957   UMBILICAL HERNIA REPAIR  2001   Patient Active Problem List   Diagnosis Date Noted   Hypophosphatemia 09/11/2021   Epistaxis 09/11/2021   Hypokalemia 09/10/2021   Hyperglycemia 09/08/2021   Atrial fibrillation with RVR (HCC) 11/06/2017   HLD (hyperlipidemia) 11/06/2017   Paroxysmal atrial fibrillation (HCC) 10/23/2017   Pain in finger of left hand 09/20/2016   Trigger finger, left ring finger 09/20/2016   DJD (degenerative joint disease), multiple sites 03/20/2014   History of bone density study 04/19/2013   Estrogen deficiency 02/18/2013   Elevated uric acid in blood 02/18/2013   History of colonic polyps 02/18/2013   SBO (small bowel obstruction) (HCC) 11/06/2012   DJD (degenerative joint disease) 08/06/2011   Obesity 08/06/2011   Family history of colon cancer requiring screening  colonoscopy 08/06/2011   Elevated blood pressure 07/24/2011   Hyperlipidemia    Menopause    Diverticulosis     PCP: Olin Bertin, MD  REFERRING PROVIDER: Amedeo Jupiter, PA-C  REFERRING DIAG: M54.41 (ICD-10-CM) - Acute right-sided low back pain with right-sided sciatica M25.551 (ICD-10-CM) - Pain of right hip  Rationale for Evaluation and Treatment: Rehabilitation  THERAPY DIAG:  Pain in right hip  Muscle weakness (generalized)  Other abnormalities of gait and mobility  Radiculopathy, lumbosacral region  ONSET DATE: Easter 2025  SUBJECTIVE:  SUBJECTIVE STATEMENT: Feels better and felt like the dry needling was very helpful.   PERTINENT HISTORY:  Prior R foot fracture (had boot),   PAIN:  Are you having pain? Yes: NPRS scale: at rest currently 0, but can feel pain with prolonged sitting. At worst 6 or 7/10  Pain location: R posterior hip and radiates to knee Pain description: Gets sharper the longer it goes on Aggravating factors: Prolonged standing, too much walking/fatigue Relieving factors: Walking/moving loosens it up, pt feels better with feet up on pillows when sleeping  PRECAUTIONS: None  RED FLAGS: None   WEIGHT BEARING RESTRICTIONS: No  FALLS:  Has patient fallen in last 6 months? No  LIVING ENVIRONMENT: Lives with: lives with their spouse Lives in: House/apartment Stairs: Yes: Internal: but doesn't have to use steps;   and External: 6 steps; on left going up Has following equipment at home: Single point cane  OCCUPATION: Retired -- lots of walks with dogs (up to 4 miles)  PLOF: Independent  PATIENT GOALS: Improve pain to be able to go to [place] and return to her normal activities  NEXT MD VISIT: n/a  OBJECTIVE:  Note: Objective measures were completed  at Evaluation unless otherwise noted.  DIAGNOSTIC FINDINGS:  09/30/23 R hip x-ray IMPRESSION: No acute abnormality noted.  09/30/23 lumbar x-ray FINDINGS: There is no evidence of lumbar spine fracture. Dextrocurvature of spine. Narrow intervertebral spaces, anterior spurring and facet joint sclerosis are identified throughout lumbar spine more prominently involving the lower lumbar spine.  PATIENT SURVEYS:  The Patient-Specific Functional Scale  Initial:  I am going to ask you to identify up to 3 important activities that you are unable to do or are having difficulty with as a result of this problem.  Today are there any activities that you are unable to do or having difficulty with because of this?  (Patient shown scale and patient rated each activity)  Follow up: When you first came in you had difficulty performing these activities.  Today do you still have difficulty?  Patient-Specific activity scoring scheme (Point to one number):  0 1 2 3 4 5 6 7 8 9  10 Unable                                                                                                          Able to perform To perform                                                                                                    activity at the same Activity         Level as before  Injury or problem Activity Initial (eval): Follow up:  Walking 4   2.   House work  2   3.   Exercise bike 0   Average 6/3 = 2      COGNITION: Overall cognitive status: Within functional limits for tasks assessed     SENSATION: WFL  MUSCLE LENGTH: Hamstrings: Right = Left and >80 deg Andy Bannister test: Right WNL but feels tight; Left WNL but feels tight  POSTURE: flexed trunk   PALPATION: Increased TTP R SIJ, glute max, glute med, R lumbar paraspinals L hip PROM into ER has firm end feel/joint tightness R hip PROM into  ER is more than L but feels like muscle tightness  LUMBAR ROM:   AROM eval  Flexion 100%  Extension 10%  Right lateral flexion 1" above knee  Left lateral flexion 2" above knee with some discomfort  Right rotation 25% a little discomfort  Left rotation 50%   (Blank rows = not tested)  LOWER EXTREMITY ROM:     Active  Right eval Left eval  Hip flexion    Hip extension    Hip abduction    Hip adduction    Hip internal rotation    Hip external rotation    Knee flexion    Knee extension    Ankle dorsiflexion    Ankle plantarflexion    Ankle inversion    Ankle eversion     (Blank rows = not tested)  LOWER EXTREMITY MMT:    MMT Right eval Left eval  Hip flexion 5 5  Hip extension    Hip abduction    Hip adduction    Hip internal rotation    Hip external rotation    Knee flexion 5 4+ (chronic knee pain)  Knee extension 5 4+ (chronic knee pain)  Ankle dorsiflexion    Ankle plantarflexion    Ankle inversion    Ankle eversion     (Blank rows = not tested)  LUMBAR SPECIAL TESTS:  Straight leg raise test: Negative, FABER test: Positive, and Thomas test: Negative Left hip tigher than Right hip with FABER  FUNCTIONAL TESTS:  5 times sit to stand: TBA  GAIT: Distance walked: Into clinic Assistive device utilized: Single point cane Level of assistance: SBA Comments: Guarded, slow, diminished bilat step length, decreased stance time R>L, flexed at hips  TREATMENT DATE:  10/20/23 TherEx NuStep L6 x 8 min Lower trunk rotation 10 x 10 sec hold bil Supine piriformis stretch 3x30 sec bil (modified on Lt)  Manual STM with compression to Rt glutes; skilled palpation and monitoring of soft tissue during DN  Trigger Point Dry Needling  Subsequent Treatment: Instructions provided previously at initial dry needling treatment.   Patient Verbal Consent Given: Yes Education Handout Provided: Previously Provided Muscles Treated: Rt glute med Electrical Stimulation  Performed: Yes, Parameters: 10 mA freq x 3 min; intensity to tolerance Treatment Response/Outcome: twitch responses with decreased tightness reported     10/16/23 See HEP Skilled assessment and palpation for TPDN  STM & TPR R sacral paraspinal and glute max Trigger Point Dry Needling  Initial Treatment: Pt instructed on Dry Needling rational, procedures, and possible side effects. Pt instructed to expect mild to moderate muscle soreness later in the day and/or into the next day.  Pt instructed in methods to reduce muscle soreness. Pt instructed to continue prescribed HEP. Patient was educated on signs and symptoms of infection and other risk factors and advised to seek medical attention should they  occur.  Patient verbalized understanding of these instructions and education.   Patient Verbal Consent Given: Yes Education Handout Provided: Yes Muscles Treated: R sacral paraspinal (near SIJ), R glute max Electrical Stimulation Performed: No Treatment Response/Outcome: Decreased muscle tension    PATIENT EDUCATION:  Education details: Exam findings, initial HEP, POC Person educated: Patient Education method: Explanation, Demonstration, and Handouts Education comprehension: verbalized understanding, returned demonstration, and needs further education  HOME EXERCISE PROGRAM: Access Code: [email or web address] URL: [email or web address] Date: 10/16/2023 Prepared by: Gellen April Erman [place]  Exercises - Supine Lower Trunk Rotation  - 1 x daily - 7 x weekly - 1 sets - 5 reps - 5-10 sec hold - Modified Thomas Stretch  - 1 x daily - 7 x weekly - 1 sets - 2 reps - 30 sec hold - Supine Piriformis Stretch with Foot on Ground  - 1 x daily - 7 x weekly - 1 sets - 2 reps - 30 sec hold  Patient Education - Trigger Point Dry Needling  ASSESSMENT:  CLINICAL IMPRESSION: Session today included repeat of DN as this was beneficial last session as well as review of HEP. Mod cues needed for  HEP as pt has not completed at home.  Will continue to benefit from PT to maximize function.  OBJECTIVE IMPAIRMENTS: Abnormal gait, decreased activity tolerance, decreased balance, decreased endurance, decreased mobility, difficulty walking, decreased ROM, decreased strength, hypomobility, increased fascial restrictions, increased muscle spasms, improper body mechanics, postural dysfunction, and pain.   ACTIVITY LIMITATIONS: standing, squatting, stairs, transfers, bed mobility, and locomotion level  PARTICIPATION LIMITATIONS: meal prep, cleaning, laundry, shopping, and community activity  PERSONAL FACTORS: Age, Fitness, Past/current experiences, and Time since onset of injury/illness/exacerbation are also affecting patient's functional outcome.   REHAB POTENTIAL: Good  CLINICAL DECISION MAKING: Evolving/moderate complexity  EVALUATION COMPLEXITY: Moderate   GOALS: Goals reviewed with patient? Yes  SHORT TERM GOALS: Target date: 11/13/2023   Pt will be ind with initial HEP Baseline: Goal status: INITIAL  2.  Pt will report >/=50% improvement with pain Baseline:  Goal status: INITIAL    LONG TERM GOALS: Target date: 12/11/2023   Pt will be ind with management and progression of HEP Baseline:  Goal status: INITIAL  2.  Pt will be able to tolerate standing at least 20 min without pain Baseline:  Goal status: INITIAL  3.  Pt will have improved PSFS average score to >/=5/10 to demo MCID Baseline:  Goal status: INITIAL  4.  Pt will be able to perform all lumbar ROM without pain for bending and lifting tasks at home and in the community Baseline:  Goal status: INITIAL  5.  Pt will report >/=75% improvement with her pain Baseline:  Goal status: INITIAL    PLAN:  PT FREQUENCY: 2x/week  PT DURATION: 8 weeks  PLANNED INTERVENTIONS: 97164- PT Re-evaluation, 97110-Therapeutic exercises, 97530- Therapeutic activity, 97112- Neuromuscular re-education, 97535- Self Care,  97140- Manual therapy, [phone]- Gait training, 62130- Aquatic Therapy, [phone]- Electrical stimulation (unattended), [phone]- Traction (mechanical), [id number]- Ionotophoresis 4mg /ml Dexamethasone , Patient/Family education, Balance training, Stair training, Taping, Dry Needling, Joint mobilization, Spinal mobilization, Cryotherapy, and Moist heat.  PLAN FOR NEXT SESSION: Hip stretching/strengthening. Initiate core strengthening. Manual therapy/dry needling as indicated.    Marley Simmers, PT, DPT 10/20/23 11:12 AM    Referring diagnosis? M54.41 (ICD-10-CM) - Acute right-sided low back pain with right-sided sciatica M25.551 (ICD-10-CM) - Pain of right hip Treatment diagnosis? (if different than referring diagnosis) M25.551 Pain in right hip, M62.81 Muscle Weakness What  was this (referring dx) caused by? []  Surgery []  Fall [x]  Ongoing issue []  Arthritis []  Other: ____________  Laterality: [x]  Rt []  Lt []  Both  Check all possible CPT codes:  *CHOOSE 10 OR LESS*    See Planned Interventions listed in the Plan section of the Evaluation.

## 2023-10-23 DIAGNOSIS — Z5181 Encounter for therapeutic drug level monitoring: Secondary | ICD-10-CM | POA: Diagnosis not present

## 2023-10-23 DIAGNOSIS — I480 Paroxysmal atrial fibrillation: Secondary | ICD-10-CM | POA: Diagnosis not present

## 2023-10-23 DIAGNOSIS — Z7901 Long term (current) use of anticoagulants: Secondary | ICD-10-CM | POA: Diagnosis not present

## 2023-10-23 DIAGNOSIS — Z79899 Other long term (current) drug therapy: Secondary | ICD-10-CM | POA: Diagnosis not present

## 2023-10-24 ENCOUNTER — Ambulatory Visit: Admitting: Physical Therapy

## 2023-10-24 DIAGNOSIS — R2689 Other abnormalities of gait and mobility: Secondary | ICD-10-CM | POA: Diagnosis not present

## 2023-10-24 DIAGNOSIS — M6281 Muscle weakness (generalized): Secondary | ICD-10-CM | POA: Diagnosis not present

## 2023-10-24 DIAGNOSIS — M5417 Radiculopathy, lumbosacral region: Secondary | ICD-10-CM | POA: Diagnosis not present

## 2023-10-24 DIAGNOSIS — M25551 Pain in right hip: Secondary | ICD-10-CM | POA: Diagnosis not present

## 2023-10-24 NOTE — Therapy (Signed)
 OUTPATIENT PHYSICAL THERAPY TREATMENT   Patient Name: Kelly Walker MRN: [id number] DOB:1946/05/10, 78 y.o., female Today's Date: 10/24/2023  END OF SESSION:  PT End of Session - 10/24/23 0807     Visit Number 3    Number of Visits 16    Date for PT Re-Evaluation 12/11/23    Authorization Type [employer]    Progress Note Due on Visit 10    PT Start Time 0807   late sign in   PT Stop Time 0845    PT Time Calculation (min) 38 min    Activity Tolerance Patient tolerated treatment well    Behavior During Therapy [facility] for tasks assessed/performed             Past Medical History:  Diagnosis Date   Atrial fibrillation (HCC)    on Eliquis    Diverticulosis    Gout    Grade I diastolic dysfunction    Hyperlipidemia    Menopause    SBO (small bowel obstruction) (HCC) 11/06/2012   Past Surgical History:  Procedure Laterality Date   ABDOMINAL HYSTERECTOMY  2001   APPENDECTOMY  2001   CARDIOVERSION N/A 11/07/2017   Procedure: CARDIOVERSION;  Surgeon: Darlis Eisenmenger, MD;  Location: [facility];  Service: Cardiovascular;  Laterality: N/A;   CATARACT EXTRACTION Bilateral    TONSILLECTOMY  1957   UMBILICAL HERNIA REPAIR  2001   Patient Active Problem List   Diagnosis Date Noted   Hypophosphatemia 09/11/2021   Epistaxis 09/11/2021   Hypokalemia 09/10/2021   Hyperglycemia 09/08/2021   Atrial fibrillation with RVR (HCC) 11/06/2017   HLD (hyperlipidemia) 11/06/2017   Paroxysmal atrial fibrillation (HCC) 10/23/2017   Pain in finger of left hand 09/20/2016   Trigger finger, left ring finger 09/20/2016   DJD (degenerative joint disease), multiple sites 03/20/2014   History of bone density study 04/19/2013   Estrogen deficiency 02/18/2013   Elevated uric acid in blood 02/18/2013   History of colonic polyps 02/18/2013   SBO (small bowel obstruction) (HCC) 11/06/2012   DJD (degenerative joint disease) 08/06/2011   Obesity 08/06/2011   Family history of colon cancer  requiring screening colonoscopy 08/06/2011   Elevated blood pressure 07/24/2011   Hyperlipidemia    Menopause    Diverticulosis     PCP: Olin Bertin, MD  REFERRING PROVIDER: Amedeo Jupiter, PA-C  REFERRING DIAG: M54.41 (ICD-10-CM) - Acute right-sided low back pain with right-sided sciatica M25.551 (ICD-10-CM) - Pain of right hip  Rationale for Evaluation and Treatment: Rehabilitation  THERAPY DIAG:  No diagnosis found.  ONSET DATE: Easter 2025  SUBJECTIVE:  SUBJECTIVE STATEMENT: Pt states more "mild discomfort" this morning. Reports she was sore for 2 days after last needling session but felt good afterwards. Pt reports she is able to stand and walk with more ease.   PERTINENT HISTORY:  Prior R foot fracture (had boot),   PAIN:  Are you having pain? Yes: NPRS scale: at rest currently 0, but can feel pain with prolonged sitting. At worst 6 or 7/10  Pain location: R posterior hip and radiates to knee Pain description: Gets sharper the longer it goes on Aggravating factors: Prolonged standing, too much walking/fatigue Relieving factors: Walking/moving loosens it up, pt feels better with feet up on pillows when sleeping  PRECAUTIONS: None  RED FLAGS: None   WEIGHT BEARING RESTRICTIONS: No  FALLS:  Has patient fallen in last 6 months? No  LIVING ENVIRONMENT: Lives with: lives with their spouse Lives in: House/apartment Stairs: Yes: Internal: but doesn't have to use steps;   and External: 6 steps; on left going up Has following equipment at home: Single point cane  OCCUPATION: Retired -- lots of walks with dogs (up to 4 miles)  PLOF: Independent  PATIENT GOALS: Improve pain to be able to go to [place] and return to her normal activities  NEXT MD VISIT: n/a  OBJECTIVE:   Note: Objective measures were completed at Evaluation unless otherwise noted.  DIAGNOSTIC FINDINGS:  09/30/23 R hip x-ray IMPRESSION: No acute abnormality noted.  09/30/23 lumbar x-ray FINDINGS: There is no evidence of lumbar spine fracture. Dextrocurvature of spine. Narrow intervertebral spaces, anterior spurring and facet joint sclerosis are identified throughout lumbar spine more prominently involving the lower lumbar spine.  PATIENT SURVEYS:  The Patient-Specific Functional Scale  Initial:  I am going to ask you to identify up to 3 important activities that you are unable to do or are having difficulty with as a result of this problem.  Today are there any activities that you are unable to do or having difficulty with because of this?  (Patient shown scale and patient rated each activity)  Follow up: When you first came in you had difficulty performing these activities.  Today do you still have difficulty?  Patient-Specific activity scoring scheme (Point to one number):  0 1 2 3 4 5 6 7 8 9  10 Unable                                                                                                          Able to perform To perform  activity at the same Activity         Level as before                                                                                                                       Injury or problem Activity Initial (eval): Follow up:  Walking 4   2.   House work  2   3.   Exercise bike 0   Average 6/3 = 2      COGNITION: Overall cognitive status: Within functional limits for tasks assessed     SENSATION: WFL  MUSCLE LENGTH: Hamstrings: Right = Left and >80 deg Andy Bannister test: Right WNL but feels tight; Left WNL but feels tight  POSTURE: flexed trunk   PALPATION: Increased TTP R SIJ, glute max, glute med, R lumbar paraspinals L hip PROM into ER has firm  end feel/joint tightness R hip PROM into ER is more than L but feels like muscle tightness  LUMBAR ROM:   AROM eval  Flexion 100%  Extension 10%  Right lateral flexion 1" above knee  Left lateral flexion 2" above knee with some discomfort  Right rotation 25% a little discomfort  Left rotation 50%   (Blank rows = not tested)  LOWER EXTREMITY ROM:     Active  Right eval Left eval  Hip flexion    Hip extension    Hip abduction    Hip adduction    Hip internal rotation    Hip external rotation    Knee flexion    Knee extension    Ankle dorsiflexion    Ankle plantarflexion    Ankle inversion    Ankle eversion     (Blank rows = not tested)  LOWER EXTREMITY MMT:    MMT Right eval Left eval  Hip flexion 5 5  Hip extension    Hip abduction    Hip adduction    Hip internal rotation    Hip external rotation    Knee flexion 5 4+ (chronic knee pain)  Knee extension 5 4+ (chronic knee pain)  Ankle dorsiflexion    Ankle plantarflexion    Ankle inversion    Ankle eversion     (Blank rows = not tested)  LUMBAR SPECIAL TESTS:  Straight leg raise test: Negative, FABER test: Positive, and Thomas test: Negative Left hip tigher than Right hip with FABER  FUNCTIONAL TESTS:  5 times sit to stand: TBA  GAIT: Distance walked: Into clinic Assistive device utilized: Single point cane Level of assistance: SBA Comments: Guarded, slow, diminished bilat step length, decreased stance time R>L, flexed at hips  TREATMENT DATE:  10/24/23 TherEx Scifit L1 x 5 min for warm up Seated hamstring stretch x 30" Seated hip flexor stretch 2x30" Seated piriformis stretch 2x30" Seated figure 4 stretch x30"  Therapeutic Activity Standing hip ext 2x10 to fatigue Staggered stance glute set Amb with glute setting during stance phase   10/20/23 TherEx NuStep L6 x 8 min Lower  trunk rotation 10 x 10 sec hold bil Supine piriformis stretch 3x30 sec bil (modified on Lt)  Manual STM with  compression to Rt glutes; skilled palpation and monitoring of soft tissue during DN  Trigger Point Dry Needling  Subsequent Treatment: Instructions provided previously at initial dry needling treatment.   Patient Verbal Consent Given: Yes Education Handout Provided: Previously Provided Muscles Treated: Rt glute med Electrical Stimulation Performed: Yes, Parameters: 10 mA freq x 3 min; intensity to tolerance Treatment Response/Outcome: twitch responses with decreased tightness reported     10/16/23 See HEP Skilled assessment and palpation for TPDN  STM & TPR R sacral paraspinal and glute max Trigger Point Dry Needling  Initial Treatment: Pt instructed on Dry Needling rational, procedures, and possible side effects. Pt instructed to expect mild to moderate muscle soreness later in the day and/or into the next day.  Pt instructed in methods to reduce muscle soreness. Pt instructed to continue prescribed HEP. Patient was educated on signs and symptoms of infection and other risk factors and advised to seek medical attention should they occur.  Patient verbalized understanding of these instructions and education.   Patient Verbal Consent Given: Yes Education Handout Provided: Yes Muscles Treated: R sacral paraspinal (near SIJ), R glute max Electrical Stimulation Performed: No Treatment Response/Outcome: Decreased muscle tension    PATIENT EDUCATION:  Education details: Exam findings, initial HEP, POC Person educated: Patient Education method: Explanation, Demonstration, and Handouts Education comprehension: verbalized understanding, returned demonstration, and needs further education  HOME EXERCISE PROGRAM: Access Code: [email or web address] URL: [email or web address] Date: 10/16/2023 Prepared by: Kaylon Laroche April Erman [place]  Exercises - Supine Lower Trunk Rotation  - 1 x daily - 7 x weekly - 1 sets - 5 reps - 5-10 sec hold - Modified Thomas Stretch  - 1 x daily - 7 x weekly  - 1 sets - 2 reps - 30 sec hold - Supine Piriformis Stretch with Foot on Ground  - 1 x daily - 7 x weekly - 1 sets - 2 reps - 30 sec hold  Patient Education - Trigger Point Dry Needling  ASSESSMENT:  CLINICAL IMPRESSION: Deferred DN today. Provided more stretches in sitting to keep managing her hip tightness. Initiated standing strengthening exercise to improve her standing tolerance.   OBJECTIVE IMPAIRMENTS: Abnormal gait, decreased activity tolerance, decreased balance, decreased endurance, decreased mobility, difficulty walking, decreased ROM, decreased strength, hypomobility, increased fascial restrictions, increased muscle spasms, improper body mechanics, postural dysfunction, and pain.   ACTIVITY LIMITATIONS: standing, squatting, stairs, transfers, bed mobility, and locomotion level  PARTICIPATION LIMITATIONS: meal prep, cleaning, laundry, shopping, and community activity  PERSONAL FACTORS: Age, Fitness, Past/current experiences, and Time since onset of injury/illness/exacerbation are also affecting patient's functional outcome.   REHAB POTENTIAL: Good  CLINICAL DECISION MAKING: Evolving/moderate complexity  EVALUATION COMPLEXITY: Moderate   GOALS: Goals reviewed with patient? Yes  SHORT TERM GOALS: Target date: 11/13/2023   Pt will be ind with initial HEP Baseline: Goal status: INITIAL  2.  Pt will report >/=50% improvement with pain Baseline:  Goal status: INITIAL    LONG TERM GOALS: Target date: 12/11/2023   Pt will be ind with management and progression of HEP Baseline:  Goal status: INITIAL  2.  Pt will be able to tolerate standing at least 20 min without pain Baseline:  Goal status: INITIAL  3.  Pt will have improved PSFS average score to >/=5/10 to demo MCID Baseline:  Goal status: INITIAL  4.  Pt will be able to perform all  lumbar ROM without pain for bending and lifting tasks at home and in the community Baseline:  Goal status: INITIAL  5.  Pt  will report >/=75% improvement with her pain Baseline:  Goal status: INITIAL    PLAN:  PT FREQUENCY: 2x/week  PT DURATION: 8 weeks  PLANNED INTERVENTIONS: 97164- PT Re-evaluation, 97110-Therapeutic exercises, 97530- Therapeutic activity, 97112- Neuromuscular re-education, 97535- Self Care, 16109- Manual therapy, [phone]- Gait training, [phone]- Aquatic Therapy, [phone]- Electrical stimulation (unattended), [phone]- Traction (mechanical), [id number]- Ionotophoresis 4mg /ml Dexamethasone , Patient/Family education, Balance training, Stair training, Taping, Dry Needling, Joint mobilization, Spinal mobilization, Cryotherapy, and Moist heat.  PLAN FOR NEXT SESSION: Hip stretching/strengthening. Initiate core strengthening. Manual therapy/dry needling as indicated.    Crescent Gotham April Ma L [place], PT, DPT 10/24/23 8:07 AM    Referring diagnosis? M54.41 (ICD-10-CM) - Acute right-sided low back pain with right-sided sciatica M25.551 (ICD-10-CM) - Pain of right hip Treatment diagnosis? (if different than referring diagnosis) M25.551 Pain in right hip, M62.81 Muscle Weakness What was this (referring dx) caused by? []  Surgery []  Fall [x]  Ongoing issue []  Arthritis []  Other: ____________  Laterality: [x]  Rt []  Lt []  Both  Check all possible CPT codes:  *CHOOSE 10 OR LESS*    See Planned Interventions listed in the Plan section of the Evaluation.

## 2023-11-10 DIAGNOSIS — D225 Melanocytic nevi of trunk: Secondary | ICD-10-CM | POA: Diagnosis not present

## 2023-11-10 DIAGNOSIS — L821 Other seborrheic keratosis: Secondary | ICD-10-CM | POA: Diagnosis not present

## 2023-11-10 DIAGNOSIS — Z08 Encounter for follow-up examination after completed treatment for malignant neoplasm: Secondary | ICD-10-CM | POA: Diagnosis not present

## 2023-11-10 DIAGNOSIS — Z85828 Personal history of other malignant neoplasm of skin: Secondary | ICD-10-CM | POA: Diagnosis not present

## 2023-11-10 DIAGNOSIS — L814 Other melanin hyperpigmentation: Secondary | ICD-10-CM | POA: Diagnosis not present

## 2023-11-11 ENCOUNTER — Ambulatory Visit: Admitting: Physical Therapy

## 2023-11-11 ENCOUNTER — Encounter: Payer: Self-pay | Admitting: Physical Therapy

## 2023-11-11 DIAGNOSIS — M5417 Radiculopathy, lumbosacral region: Secondary | ICD-10-CM | POA: Diagnosis not present

## 2023-11-11 DIAGNOSIS — M6281 Muscle weakness (generalized): Secondary | ICD-10-CM | POA: Diagnosis not present

## 2023-11-11 DIAGNOSIS — M25551 Pain in right hip: Secondary | ICD-10-CM

## 2023-11-11 DIAGNOSIS — R2689 Other abnormalities of gait and mobility: Secondary | ICD-10-CM | POA: Diagnosis not present

## 2023-11-11 NOTE — Therapy (Signed)
 OUTPATIENT PHYSICAL THERAPY TREATMENT   Patient Name: Amarylis Rovito MRN: [id number] DOB:[birth year], 78 y.o., female Today's Date: 11/11/2023  END OF SESSION:  PT End of Session - 11/11/23 1020     Visit Number 4    Number of Visits 16    Date for PT Re-Evaluation 12/11/23    Authorization Type [employer]    Progress Note Due on Visit 10    PT Start Time 1020    PT Stop Time 1100    PT Time Calculation (min) 40 min    Activity Tolerance Patient tolerated treatment well    Behavior During Therapy WFL for tasks assessed/performed              Past Medical History:  Diagnosis Date   Atrial fibrillation (HCC)    on Eliquis    Diverticulosis    Gout    Grade I diastolic dysfunction    Hyperlipidemia    Menopause    SBO (small bowel obstruction) (HCC) 11/06/2012   Past Surgical History:  Procedure Laterality Date   ABDOMINAL HYSTERECTOMY  2001   APPENDECTOMY  2001   CARDIOVERSION N/A 11/07/2017   Procedure: CARDIOVERSION;  Surgeon: Darlis Eisenmenger, MD;  Location: [facility] ENDOSCOPY;  Service: Cardiovascular;  Laterality: N/A;   CATARACT EXTRACTION Bilateral    TONSILLECTOMY  1957   UMBILICAL HERNIA REPAIR  2001   Patient Active Problem List   Diagnosis Date Noted   Hypophosphatemia 09/11/2021   Epistaxis 09/11/2021   Hypokalemia 09/10/2021   Hyperglycemia 09/08/2021   Atrial fibrillation with RVR (HCC) 11/06/2017   HLD (hyperlipidemia) 11/06/2017   Paroxysmal atrial fibrillation (HCC) 10/23/2017   Pain in finger of left hand 09/20/2016   Trigger finger, left ring finger 09/20/2016   DJD (degenerative joint disease), multiple sites 03/20/2014   History of bone density study 04/19/2013   Estrogen deficiency 02/18/2013   Elevated uric acid in blood 02/18/2013   History of colonic polyps 02/18/2013   SBO (small bowel obstruction) (HCC) 11/06/2012   DJD (degenerative joint disease) 08/06/2011   Obesity 08/06/2011   Family history of colon cancer requiring screening  colonoscopy 08/06/2011   Elevated blood pressure 07/24/2011   Hyperlipidemia    Menopause    Diverticulosis     PCP: Olin Bertin, MD  REFERRING PROVIDER: Amedeo Jupiter, PA-C  REFERRING DIAG: M54.41 (ICD-10-CM) - Acute right-sided low back pain with right-sided sciatica M25.551 (ICD-10-CM) - Pain of right hip  Rationale for Evaluation and Treatment: Rehabilitation  THERAPY DIAG:  Pain in right hip  Muscle weakness (generalized)  Other abnormalities of gait and mobility  Radiculopathy, lumbosacral region  ONSET DATE: Easter 2025  SUBJECTIVE:  SUBJECTIVE STATEMENT: Pt states she did okay during her trip. Reports the only thing that set her back was the long plane ride. States hip is feeling "quite good" today.   PERTINENT HISTORY:  Prior R foot fracture (had boot),   PAIN:  Are you having pain? Yes: NPRS scale: at rest currently 0, but can feel pain with prolonged sitting. At worst 6 or 7/10  Pain location: R posterior hip and radiates to knee Pain description: Gets sharper the longer it goes on Aggravating factors: Prolonged standing, too much walking/fatigue Relieving factors: Walking/moving loosens it up, pt feels better with feet up on pillows when sleeping  PRECAUTIONS: None  RED FLAGS: None   WEIGHT BEARING RESTRICTIONS: No  FALLS:  Has patient fallen in last 6 months? No  LIVING ENVIRONMENT: Lives with: lives with their spouse Lives in: House/apartment Stairs: Yes: Internal: but doesn't have to use steps;   and External: 6 steps; on left going up Has following equipment at home: Single point cane  OCCUPATION: Retired -- lots of walks with dogs (up to 4 miles)  PLOF: Independent  PATIENT GOALS: Improve pain to be able to go to [place] and return to her normal  activities  NEXT MD VISIT: n/a  OBJECTIVE:  Note: Objective measures were completed at Evaluation unless otherwise noted.  DIAGNOSTIC FINDINGS:  09/30/23 R hip x-ray IMPRESSION: No acute abnormality noted.  09/30/23 lumbar x-ray FINDINGS: There is no evidence of lumbar spine fracture. Dextrocurvature of spine. Narrow intervertebral spaces, anterior spurring and facet joint sclerosis are identified throughout lumbar spine more prominently involving the lower lumbar spine.  PATIENT SURVEYS:  The Patient-Specific Functional Scale  Initial:  I am going to ask you to identify up to 3 important activities that you are unable to do or are having difficulty with as a result of this problem.  Today are there any activities that you are unable to do or having difficulty with because of this?  (Patient shown scale and patient rated each activity)  Follow up: When you first came in you had difficulty performing these activities.  Today do you still have difficulty?  Patient-Specific activity scoring scheme (Point to one number):  0 1 2 3 4 5 6 7 8 9  10 Unable                                                                                                          Able to perform To perform                                                                                                    activity at the  same Activity         Level as before                                                                                                                       Injury or problem Activity Initial (eval): Follow up:  Walking 4   2.   House work  2   3.   Exercise bike 0   Average 6/3 = 2      COGNITION: Overall cognitive status: Within functional limits for tasks assessed     SENSATION: WFL  MUSCLE LENGTH: Hamstrings: Right = Left and >80 deg Andy Bannister test: Right WNL but feels tight; Left WNL but feels tight  POSTURE: flexed trunk   PALPATION: Increased TTP R SIJ, glute max, glute med, R  lumbar paraspinals L hip PROM into ER has firm end feel/joint tightness R hip PROM into ER is more than L but feels like muscle tightness  LUMBAR ROM:   AROM eval  Flexion 100%  Extension 10%  Right lateral flexion 1" above knee  Left lateral flexion 2" above knee with some discomfort  Right rotation 25% a little discomfort  Left rotation 50%   (Blank rows = not tested)  LOWER EXTREMITY ROM:     Active  Right eval Left eval  Hip flexion    Hip extension    Hip abduction    Hip adduction    Hip internal rotation    Hip external rotation    Knee flexion    Knee extension    Ankle dorsiflexion    Ankle plantarflexion    Ankle inversion    Ankle eversion     (Blank rows = not tested)  LOWER EXTREMITY MMT:    MMT Right eval Left eval  Hip flexion 5 5  Hip extension    Hip abduction    Hip adduction    Hip internal rotation    Hip external rotation    Knee flexion 5 4+ (chronic knee pain)  Knee extension 5 4+ (chronic knee pain)  Ankle dorsiflexion    Ankle plantarflexion    Ankle inversion    Ankle eversion     (Blank rows = not tested)  LUMBAR SPECIAL TESTS:  Straight leg raise test: Negative, FABER test: Positive, and Thomas test: Negative Left hip tigher than Right hip with FABER  FUNCTIONAL TESTS:  5 times sit to stand: TBA  GAIT: Distance walked: Into clinic Assistive device utilized: Single point cane Level of assistance: SBA Comments: Guarded, slow, diminished bilat step length, decreased stance time R>L, flexed at hips  TREATMENT DATE:  11/11/23 TherEx Nustep L6 x 5 min UEs/LEs Standing hip flexor stretch on aerobic step 2x30" Lumbar ext against counter x10  Therapeutic Activity Foot on aerobic step in staggered stance glute setting x10 Standing hip ext slide no UE support to fatigue Standing hip abd with UE support to fatigue   10/24/23 TherEx Scifit L1 x 5 min for warm  up Seated hamstring stretch x 30" Seated hip flexor stretch  2x30" Seated piriformis stretch 2x30" Seated figure 4 stretch x30"  Therapeutic Activity Standing hip ext 2x10 to fatigue Staggered stance glute set Amb with glute setting during stance phase   10/20/23 TherEx NuStep L6 x 8 min Lower trunk rotation 10 x 10 sec hold bil Supine piriformis stretch 3x30 sec bil (modified on Lt)  Manual STM with compression to Rt glutes; skilled palpation and monitoring of soft tissue during DN  Trigger Point Dry Needling  Subsequent Treatment: Instructions provided previously at initial dry needling treatment.   Patient Verbal Consent Given: Yes Education Handout Provided: Previously Provided Muscles Treated: Rt glute med Electrical Stimulation Performed: Yes, Parameters: 10 mA freq x 3 min; intensity to tolerance Treatment Response/Outcome: twitch responses with decreased tightness reported     10/16/23 See HEP Skilled assessment and palpation for TPDN  STM & TPR R sacral paraspinal and glute max Trigger Point Dry Needling  Initial Treatment: Pt instructed on Dry Needling rational, procedures, and possible side effects. Pt instructed to expect mild to moderate muscle soreness later in the day and/or into the next day.  Pt instructed in methods to reduce muscle soreness. Pt instructed to continue prescribed HEP. Patient was educated on signs and symptoms of infection and other risk factors and advised to seek medical attention should they occur.  Patient verbalized understanding of these instructions and education.   Patient Verbal Consent Given: Yes Education Handout Provided: Yes Muscles Treated: R sacral paraspinal (near SIJ), R glute max Electrical Stimulation Performed: No Treatment Response/Outcome: Decreased muscle tension    PATIENT EDUCATION:  Education details: Exam findings, initial HEP, POC Person educated: Patient Education method: Explanation, Demonstration, and Handouts Education comprehension: verbalized  understanding, returned demonstration, and needs further education  HOME EXERCISE PROGRAM: Access Code: [email or web address] URL: [email or web address] Date: 11/11/2023 Prepared by: Sakiya Stepka April Erman [place]ward  Exercises - Supine Lower Trunk Rotation  - 1 x daily - 7 x weekly - 1 sets - 5 reps - 5-10 sec hold - Modified Thomas Stretch  - 1 x daily - 7 x weekly - 1 sets - 2 reps - 30 sec hold - Supine Piriformis Stretch with Foot on Ground  - 1 x daily - 7 x weekly - 1 sets - 2 reps - 30 sec hold - Seated Piriformis Stretch  - 1 x daily - 7 x weekly - 2 sets - 30 sec hold - Seated Piriformis Stretch  - 1 x daily - 7 x weekly - 2 sets - 30 sec hold - Standing Hip Extension with Counter Support  - 1 x daily - 7 x weekly - 2 sets - 10 reps - Staggered Stance Gluteal Set with Same Side Arm Raise and Wall Slide  - 1 x daily - 7 x weekly - 1 sets - 10 reps - Standing Hip Abduction with Counter Support  - 1 x daily - 7 x weekly - 2 sets - 10 reps  ASSESSMENT:  CLINICAL IMPRESSION: Hip pain has been better managed. Working on improving hip extension to decrease pt's forward flexed posture, improving weight shift and weight bearing without compensation. Pt doesn't appear confident yet shifting full weight on to one leg. Increased R knee valgus noted. Pt has met all of her STGs. Continues to progress well towards LTGs.   OBJECTIVE IMPAIRMENTS: Abnormal gait, decreased activity tolerance, decreased balance, decreased endurance, decreased mobility, difficulty walking, decreased ROM, decreased strength, hypomobility, increased fascial restrictions, increased muscle  spasms, improper body mechanics, postural dysfunction, and pain.     GOALS: Goals reviewed with patient? Yes  SHORT TERM GOALS: Target date: 11/13/2023   Pt will be ind with initial HEP Baseline: Goal status: MET  2.  Pt will report >/=50% improvement with pain Baseline:  11/11/23: 70-75% improvement Goal status: MET    LONG TERM  GOALS: Target date: 12/11/2023   Pt will be ind with management and progression of HEP Baseline:  Goal status: INITIAL  2.  Pt will be able to tolerate standing at least 20 min without pain Baseline:  Goal status: INITIAL  3.  Pt will have improved PSFS average score to >/=5/10 to demo MCID Baseline:  Goal status: INITIAL  4.  Pt will be able to perform all lumbar ROM without pain for bending and lifting tasks at home and in the community Baseline:  Goal status: INITIAL  5.  Pt will report >/=75% improvement with her pain Baseline:  Goal status: INITIAL    PLAN:  PT FREQUENCY: 2x/week  PT DURATION: 8 weeks  PLANNED INTERVENTIONS: 97164- PT Re-evaluation, 97110-Therapeutic exercises, 97530- Therapeutic activity, 97112- Neuromuscular re-education, 97535- Self Care, 24401- Manual therapy, [phone]- Gait training, [phone]- Aquatic Therapy, [phone]- Electrical stimulation (unattended), [phone]- Traction (mechanical), [id number]- Ionotophoresis 4mg /ml Dexamethasone , Patient/Family education, Balance training, Stair training, Taping, Dry Needling, Joint mobilization, Spinal mobilization, Cryotherapy, and Moist heat.  PLAN FOR NEXT SESSION: Hip stretching/strengthening. Initiate core strengthening. Manual therapy/dry needling as indicated.    Xela Oregel April Ma L Jozee Hammer, PT, DPT 11/11/23 10:20 AM    Referring diagnosis? M54.41 (ICD-10-CM) - Acute right-sided low back pain with right-sided sciatica M25.551 (ICD-10-CM) - Pain of right hip Treatment diagnosis? (if different than referring diagnosis) M25.551 Pain in right hip, M62.81 Muscle Weakness What was this (referring dx) caused by? []  Surgery []  Fall [x]  Ongoing issue []  Arthritis []  Other: ____________  Laterality: [x]  Rt []  Lt []  Both  Check all possible CPT codes:  *CHOOSE 10 OR LESS*    See Planned Interventions listed in the Plan section of the Evaluation.

## 2023-11-13 ENCOUNTER — Ambulatory Visit: Admitting: Physical Therapy

## 2023-11-13 DIAGNOSIS — M6281 Muscle weakness (generalized): Secondary | ICD-10-CM | POA: Diagnosis not present

## 2023-11-13 DIAGNOSIS — M5417 Radiculopathy, lumbosacral region: Secondary | ICD-10-CM | POA: Diagnosis not present

## 2023-11-13 DIAGNOSIS — M25551 Pain in right hip: Secondary | ICD-10-CM | POA: Diagnosis not present

## 2023-11-13 DIAGNOSIS — R2689 Other abnormalities of gait and mobility: Secondary | ICD-10-CM | POA: Diagnosis not present

## 2023-11-13 NOTE — Therapy (Addendum)
 OUTPATIENT PHYSICAL THERAPY TREATMENT / DISCHARGE   Patient Name: Kelly Walker MRN: [id number] DOB:[birth year], 78 y.o., female Today's Date: 11/13/2023  END OF SESSION:  PT End of Session - 11/13/23 1016     Visit Number 5    Number of Visits 16    Date for PT Re-Evaluation 12/11/23    Authorization Type [employer]    Progress Note Due on Visit 10    PT Start Time 1016    PT Stop Time 1055    PT Time Calculation (min) 39 min    Activity Tolerance Patient tolerated treatment well    Behavior During Therapy WFL for tasks assessed/performed               Past Medical History:  Diagnosis Date   Atrial fibrillation (HCC)    on Eliquis    Diverticulosis    Gout    Grade I diastolic dysfunction    Hyperlipidemia    Menopause    SBO (small bowel obstruction) (HCC) 11/06/2012   Past Surgical History:  Procedure Laterality Date   ABDOMINAL HYSTERECTOMY  2001   APPENDECTOMY  2001   CARDIOVERSION N/A 11/07/2017   Procedure: CARDIOVERSION;  Surgeon: Rolan Ezra RAMAN, MD;  Location: [facility];  Service: Cardiovascular;  Laterality: N/A;   CATARACT EXTRACTION Bilateral    TONSILLECTOMY  1957   UMBILICAL HERNIA REPAIR  2001   Patient Active Problem List   Diagnosis Date Noted   Hypophosphatemia 09/11/2021   Epistaxis 09/11/2021   Hypokalemia 09/10/2021   Hyperglycemia 09/08/2021   Atrial fibrillation with RVR (HCC) 11/06/2017   HLD (hyperlipidemia) 11/06/2017   Paroxysmal atrial fibrillation (HCC) 10/23/2017   Pain in finger of left hand 09/20/2016   Trigger finger, left ring finger 09/20/2016   DJD (degenerative joint disease), multiple sites 03/20/2014   History of bone density study 04/19/2013   Estrogen deficiency 02/18/2013   Elevated uric acid in blood 02/18/2013   History of colonic polyps 02/18/2013   SBO (small bowel obstruction) (HCC) 11/06/2012   DJD (degenerative joint disease) 08/06/2011   Obesity 08/06/2011   Family history of colon cancer  requiring screening colonoscopy 08/06/2011   Elevated blood pressure 07/24/2011   Hyperlipidemia    Menopause    Diverticulosis     PCP: Verena Mems, MD  REFERRING PROVIDER: Emiliano Leonce CROME, PA-C  REFERRING DIAG: M54.41 (ICD-10-CM) - Acute right-sided low back pain with right-sided sciatica M25.551 (ICD-10-CM) - Pain of right hip  Rationale for Evaluation and Treatment: Rehabilitation  THERAPY DIAG:  Pain in right hip  Muscle weakness (generalized)  Other abnormalities of gait and mobility  Radiculopathy, lumbosacral region  ONSET DATE: Easter 2025  SUBJECTIVE:  SUBJECTIVE STATEMENT: Pt states things were going pretty good this morning. Reports she was sore after last exercise.   PERTINENT HISTORY:  Prior R foot fracture (had boot),   PAIN:  Are you having pain? Yes: NPRS scale: at rest currently 0, but can feel pain with prolonged sitting. At worst 6 or 7/10  Pain location: R posterior hip and radiates to knee Pain description: Gets sharper the longer it goes on Aggravating factors: Prolonged standing, too much walking/fatigue Relieving factors: Walking/moving loosens it up, pt feels better with feet up on pillows when sleeping  PRECAUTIONS: None  RED FLAGS: None   WEIGHT BEARING RESTRICTIONS: No  FALLS:  Has patient fallen in last 6 months? No  LIVING ENVIRONMENT: Lives with: lives with their spouse Lives in: House/apartment Stairs: Yes: Internal: but doesn't have to use steps;   and External: 6 steps; on left going up Has following equipment at home: Single point cane  OCCUPATION: Retired -- lots of walks with dogs (up to 4 miles)  PLOF: Independent  PATIENT GOALS: Improve pain to be able to go to [place] and return to her normal activities  NEXT MD VISIT:  n/a  OBJECTIVE:  Note: Objective measures were completed at Evaluation unless otherwise noted.  DIAGNOSTIC FINDINGS:  09/30/23 R hip x-ray IMPRESSION: No acute abnormality noted.  09/30/23 lumbar x-ray FINDINGS: There is no evidence of lumbar spine fracture. Dextrocurvature of spine. Narrow intervertebral spaces, anterior spurring and facet joint sclerosis are identified throughout lumbar spine more prominently involving the lower lumbar spine.  PATIENT SURVEYS:  The Patient-Specific Functional Scale  Initial:  I am going to ask you to identify up to 3 important activities that you are unable to do or are having difficulty with as a result of this problem.  Today are there any activities that you are unable to do or having difficulty with because of this?  (Patient shown scale and patient rated each activity)  Follow up: When you first came in you had difficulty performing these activities.  Today do you still have difficulty?  Patient-Specific activity scoring scheme (Point to one number):  0 1 2 3 4 5 6 7 8 9  10 Unable                                                                                                          Able to perform To perform                                                                                                    activity at the same Activity         Level as  before                                                                                                                       Injury or problem Activity Initial (eval): Follow up:  Walking 4   2.   House work  2   3.   Exercise bike 0   Average 6/3 = 2      COGNITION: Overall cognitive status: Within functional limits for tasks assessed     SENSATION: WFL  MUSCLE LENGTH: Hamstrings: Right = Left and >80 deg Debby test: Right WNL but feels tight; Left WNL but feels tight  POSTURE: flexed trunk ; R knee valgus  PALPATION: Increased TTP R SIJ, glute max, glute med, R lumbar  paraspinals L hip PROM into ER has firm end feel/joint tightness R hip PROM into ER is more than L but feels like muscle tightness  LUMBAR ROM:   AROM eval  Flexion 100%  Extension 10%  Right lateral flexion 1 above knee  Left lateral flexion 2 above knee with some discomfort  Right rotation 25% a little discomfort  Left rotation 50%   (Blank rows = not tested)  LOWER EXTREMITY ROM:     Active  Right eval Left eval  Hip flexion    Hip extension    Hip abduction    Hip adduction    Hip internal rotation    Hip external rotation    Knee flexion    Knee extension    Ankle dorsiflexion    Ankle plantarflexion    Ankle inversion    Ankle eversion     (Blank rows = not tested)  LOWER EXTREMITY MMT:    MMT Right eval Left eval  Hip flexion 5 5  Hip extension    Hip abduction    Hip adduction    Hip internal rotation    Hip external rotation    Knee flexion 5 4+ (chronic knee pain)  Knee extension 5 4+ (chronic knee pain)  Ankle dorsiflexion    Ankle plantarflexion    Ankle inversion    Ankle eversion     (Blank rows = not tested)  LUMBAR SPECIAL TESTS:  Straight leg raise test: Negative, FABER test: Positive, and Thomas test: Negative Left hip tigher than Right hip with FABER  FUNCTIONAL TESTS:  5 times sit to stand: TBA  GAIT: Distance walked: Into clinic Assistive device utilized: Single point cane Level of assistance: SBA Comments: Guarded, slow, diminished bilat step length, decreased stance time R>L, flexed at hips  TREATMENT DATE:  11/13/23 TherEx Prone at rest x2 min Prone press up x5 attempted but pt reports feeling it a lot more in her R LE Prone hamstring curl 2x10 Prone quad stretch x 30 Prone glute set 2x10 Prone hip ext x10  Therapeutic Activity Standing shoulder ext red TB 2x10 Standing row red TB 2x10  Gait Trianing Amb without a/d working on decreasing R foot pronation to reduce medial R  knee pain/valgus, cues for hip extension  to maintain upright posture Amb with SPC for improved stability working on the same above issues and able to obtain increased step length and clearance  11/11/23 TherEx Nustep L6 x 5 min UEs/LEs Standing hip flexor stretch on aerobic step 2x30 Lumbar ext against counter x10  Therapeutic Activity Foot on aerobic step in staggered stance glute setting x10 Standing hip ext slide no UE support to fatigue Standing hip abd with UE support to fatigue   10/24/23 TherEx Scifit L1 x 5 min for warm up Seated hamstring stretch x 30 Seated hip flexor stretch 2x30 Seated piriformis stretch 2x30 Seated figure 4 stretch x30  Therapeutic Activity Standing hip ext 2x10 to fatigue Staggered stance glute set Amb with glute setting during stance phase   10/20/23 TherEx NuStep L6 x 8 min Lower trunk rotation 10 x 10 sec hold bil Supine piriformis stretch 3x30 sec bil (modified on Lt)  Manual STM with compression to Rt glutes; skilled palpation and monitoring of soft tissue during DN  Trigger Point Dry Needling  Subsequent Treatment: Instructions provided previously at initial dry needling treatment.   Patient Verbal Consent Given: Yes Education Handout Provided: Previously Provided Muscles Treated: Rt glute med Electrical Stimulation Performed: Yes, Parameters: 10 mA freq x 3 min; intensity to tolerance Treatment Response/Outcome: twitch responses with decreased tightness reported     10/16/23 See HEP Skilled assessment and palpation for TPDN  STM & TPR R sacral paraspinal and glute max Trigger Point Dry Needling  Initial Treatment: Pt instructed on Dry Needling rational, procedures, and possible side effects. Pt instructed to expect mild to moderate muscle soreness later in the day and/or into the next day.  Pt instructed in methods to reduce muscle soreness. Pt instructed to continue prescribed HEP. Patient was educated on signs and symptoms of infection and other risk factors  and advised to seek medical attention should they occur.  Patient verbalized understanding of these instructions and education.   Patient Verbal Consent Given: Yes Education Handout Provided: Yes Muscles Treated: R sacral paraspinal (near SIJ), R glute max Electrical Stimulation Performed: No Treatment Response/Outcome: Decreased muscle tension    PATIENT EDUCATION:  Education details: Exam findings, initial HEP, POC Person educated: Patient Education method: Explanation, Demonstration, and Handouts Education comprehension: verbalized understanding, returned demonstration, and needs further education  HOME EXERCISE PROGRAM: Access Code: [email or web address] URL: [email or web address] Date: 11/13/2023 Prepared by: Sinclair Alligood April Marie Kristain Hu  Exercises - Modified Thomas Stretch  - 1 x daily - 7 x weekly - 1 sets - 2 reps - 30 sec hold - Supine Piriformis Stretch with Foot on Ground  - 1 x daily - 7 x weekly - 1 sets - 2 reps - 30 sec hold - Seated Piriformis Stretch  - 1 x daily - 7 x weekly - 2 sets - 30 sec hold - Seated Piriformis Stretch  - 1 x daily - 7 x weekly - 2 sets - 30 sec hold - Standing Hip Extension with Counter Support  - 1 x daily - 7 x weekly - 2 sets - 10 reps - Standing Hip Abduction with Counter Support  - 1 x daily - 7 x weekly - 2 sets - 10 reps - Prone Knee Flexion  - 1 x daily - 7 x weekly - 2 sets - 10 reps - Prone Hip Extension  - 1 x daily - 7 x weekly - 1 sets - 10 reps - Shoulder extension with resistance - Neutral  -  1 x daily - 7 x weekly - 2 sets - 10 reps - Standing Bilateral Low Shoulder Row with Anchored Resistance  - 1 x daily - 7 x weekly - 2 sets - 10 reps  ASSESSMENT:  CLINICAL IMPRESSION: Worked on exercises in prone to improve pt's lumbar and hip extension. Remains tight and shortened in her quads and hip flexors. Added midback strengthening to improve trunk extension. Pt reports she may need to be on hold for PT as she is traveling to  her Summer home in [place] for the next few months.   OBJECTIVE IMPAIRMENTS: Abnormal gait, decreased activity tolerance, decreased balance, decreased endurance, decreased mobility, difficulty walking, decreased ROM, decreased strength, hypomobility, increased fascial restrictions, increased muscle spasms, improper body mechanics, postural dysfunction, and pain.     GOALS: Goals reviewed with patient? Yes  SHORT TERM GOALS: Target date: 11/13/2023   Pt will be ind with initial HEP Baseline: Goal status: MET  2.  Pt will report >/=50% improvement with pain Baseline:  11/11/23: 70-75% improvement Goal status: MET    LONG TERM GOALS: Target date: 12/11/2023   Pt will be ind with management and progression of HEP Baseline:  Goal status: INITIAL  2.  Pt will be able to tolerate standing at least 20 min without pain Baseline:  Goal status: INITIAL  3.  Pt will have improved PSFS average score to >/=5/10 to demo MCID Baseline:  Goal status: INITIAL  4.  Pt will be able to perform all lumbar ROM without pain for bending and lifting tasks at home and in the community Baseline:  Goal status: INITIAL  5.  Pt will report >/=75% improvement with her pain Baseline:  Goal status: INITIAL    PLAN:  PT FREQUENCY: 2x/week  PT DURATION: 8 weeks  PLANNED INTERVENTIONS: 97164- PT Re-evaluation, 97110-Therapeutic exercises, 97530- Therapeutic activity, 97112- Neuromuscular re-education, 97535- Self Care, 02859- Manual therapy, [phone]- Gait training, [phone]- Aquatic Therapy, [phone]- Electrical stimulation (unattended), [phone]- Traction (mechanical), [id number]- Ionotophoresis 4mg /ml Dexamethasone , Patient/Family education, Balance training, Stair training, Taping, Dry Needling, Joint mobilization, Spinal mobilization, Cryotherapy, and Moist heat.  PLAN FOR NEXT SESSION: Hip stretching/strengthening. Initiate core strengthening. Manual therapy/dry needling as indicated.    Glorine Hanratty April Ma L  Latha Staunton, PT, DPT 11/13/23 10:20 AM  PHYSICAL THERAPY DISCHARGE SUMMARY  Visits from Start of Care: 5  Current functional level related to goals / functional outcomes: See note   Remaining deficits: See note   Education / Equipment: HEP  Patient goals were partially met. Patient is being discharged due to not returning since the last visit.  Ozell Silvan, PT, DPT, OCS, ATC 02/23/24  2:58 PM     Referring diagnosis? M54.41 (ICD-10-CM) - Acute right-sided low back pain with right-sided sciatica M25.551 (ICD-10-CM) - Pain of right hip Treatment diagnosis? (if different than referring diagnosis) M25.551 Pain in right hip, M62.81 Muscle Weakness What was this (referring dx) caused by? []  Surgery []  Fall [x]  Ongoing issue []  Arthritis []  Other: ____________  Laterality: [x]  Rt []  Lt []  Both  Check all possible CPT codes:  *CHOOSE 10 OR LESS*    See Planned Interventions listed in the Plan section of the Evaluation.

## 2023-11-18 ENCOUNTER — Encounter: Admitting: Rehabilitative and Restorative Service Providers"

## 2023-11-25 ENCOUNTER — Encounter: Admitting: Physical Therapy

## 2023-11-25 NOTE — Therapy (Deleted)
 OUTPATIENT PHYSICAL THERAPY TREATMENT   Patient Name: Kelly Walker MRN: [id number] DOB:February 29, 1946, 78 y.o., female Today's Date: 11/25/2023  END OF SESSION:      Past Medical History:  Diagnosis Date   Atrial fibrillation (HCC)    on Eliquis    Diverticulosis    Gout    Grade I diastolic dysfunction    Hyperlipidemia    Menopause    SBO (small bowel obstruction) (HCC) 11/06/2012   Past Surgical History:  Procedure Laterality Date   ABDOMINAL HYSTERECTOMY  2001   APPENDECTOMY  2001   CARDIOVERSION N/A 11/07/2017   Procedure: CARDIOVERSION;  Surgeon: Darlis Eisenmenger, MD;  Location: [facility] ENDOSCOPY;  Service: Cardiovascular;  Laterality: N/A;   CATARACT EXTRACTION Bilateral    TONSILLECTOMY  1957   UMBILICAL HERNIA REPAIR  2001   Patient Active Problem List   Diagnosis Date Noted   Hypophosphatemia 09/11/2021   Epistaxis 09/11/2021   Hypokalemia 09/10/2021   Hyperglycemia 09/08/2021   Atrial fibrillation with RVR (HCC) 11/06/2017   HLD (hyperlipidemia) 11/06/2017   Paroxysmal atrial fibrillation (HCC) 10/23/2017   Pain in finger of left hand 09/20/2016   Trigger finger, left ring finger 09/20/2016   DJD (degenerative joint disease), multiple sites 03/20/2014   History of bone density study 04/19/2013   Estrogen deficiency 02/18/2013   Elevated uric acid in blood 02/18/2013   History of colonic polyps 02/18/2013   SBO (small bowel obstruction) (HCC) 11/06/2012   DJD (degenerative joint disease) 08/06/2011   Obesity 08/06/2011   Family history of colon cancer requiring screening colonoscopy 08/06/2011   Elevated blood pressure 07/24/2011   Hyperlipidemia    Menopause    Diverticulosis     PCP: Olin Bertin, MD  REFERRING PROVIDER: Amedeo Jupiter, PA-C  REFERRING DIAG: M54.41 (ICD-10-CM) - Acute right-sided low back pain with right-sided sciatica M25.551 (ICD-10-CM) - Pain of right hip  Rationale for Evaluation and Treatment: Rehabilitation  THERAPY  DIAG:  No diagnosis found.  ONSET DATE: Easter 2025  SUBJECTIVE:                                                                                                                                                                                           SUBJECTIVE STATEMENT: *** Pt states things were going pretty good this morning. Reports she was sore after last exercise.   PERTINENT HISTORY:  Prior R foot fracture (had boot),   PAIN:  Are you having pain? Yes: NPRS scale: at rest currently 0, but can feel pain with prolonged sitting. At worst 6 or 7/10  Pain location: R posterior hip and radiates to knee Pain description: Gets  sharper the longer it goes on Aggravating factors: Prolonged standing, too much walking/fatigue Relieving factors: Walking/moving loosens it up, pt feels better with feet up on pillows when sleeping  PRECAUTIONS: None  RED FLAGS: None   WEIGHT BEARING RESTRICTIONS: No  FALLS:  Has patient fallen in last 6 months? No  LIVING ENVIRONMENT: Lives with: lives with their spouse Lives in: House/apartment Stairs: Yes: Internal: but doesn't have to use steps;   and External: 6 steps; on left going up Has following equipment at home: Single point cane  OCCUPATION: Retired -- lots of walks with dogs (up to 4 miles)  PLOF: Independent  PATIENT GOALS: Improve pain to be able to go to [place] and return to her normal activities  NEXT MD VISIT: n/a  OBJECTIVE:  Note: Objective measures were completed at Evaluation unless otherwise noted.  DIAGNOSTIC FINDINGS:  09/30/23 R hip x-ray IMPRESSION: No acute abnormality noted.  09/30/23 lumbar x-ray FINDINGS: There is no evidence of lumbar spine fracture. Dextrocurvature of spine. Narrow intervertebral spaces, anterior spurring and facet joint sclerosis are identified throughout lumbar spine more prominently involving the lower lumbar spine.  PATIENT SURVEYS:  The Patient-Specific Functional Scale  Initial:   I am going to ask you to identify up to 3 important activities that you are unable to do or are having difficulty with as a result of this problem.  Today are there any activities that you are unable to do or having difficulty with because of this?  (Patient shown scale and patient rated each activity)  Follow up: When you first came in you had difficulty performing these activities.  Today do you still have difficulty?  Patient-Specific activity scoring scheme (Point to one number):  0 1 2 3 4 5 6 7 8 9  10 Unable                                                                                                          Able to perform To perform                                                                                                    activity at the same Activity         Level as before  Injury or problem Activity Initial (eval): Follow up:  Walking 4   2.   House work  2   3.   Exercise bike 0   Average 6/3 = 2      COGNITION: Overall cognitive status: Within functional limits for tasks assessed     SENSATION: WFL  MUSCLE LENGTH: Hamstrings: Right = Left and >80 deg Andy Bannister test: Right WNL but feels tight; Left WNL but feels tight  POSTURE: flexed trunk ; R knee valgus  PALPATION: Increased TTP R SIJ, glute max, glute med, R lumbar paraspinals L hip PROM into ER has firm end feel/joint tightness R hip PROM into ER is more than L but feels like muscle tightness  LUMBAR ROM:   AROM eval  Flexion 100%  Extension 10%  Right lateral flexion 1 above knee  Left lateral flexion 2 above knee with some discomfort  Right rotation 25% a little discomfort  Left rotation 50%   (Blank rows = not tested)  LOWER EXTREMITY ROM:     Active  Right eval Left eval  Hip flexion    Hip extension    Hip abduction    Hip adduction    Hip internal rotation    Hip  external rotation    Knee flexion    Knee extension    Ankle dorsiflexion    Ankle plantarflexion    Ankle inversion    Ankle eversion     (Blank rows = not tested)  LOWER EXTREMITY MMT:    MMT Right eval Left eval  Hip flexion 5 5  Hip extension    Hip abduction    Hip adduction    Hip internal rotation    Hip external rotation    Knee flexion 5 4+ (chronic knee pain)  Knee extension 5 4+ (chronic knee pain)  Ankle dorsiflexion    Ankle plantarflexion    Ankle inversion    Ankle eversion     (Blank rows = not tested)  LUMBAR SPECIAL TESTS:  Straight leg raise test: Negative, FABER test: Positive, and Thomas test: Negative Left hip tigher than Right hip with FABER  FUNCTIONAL TESTS:  5 times sit to stand: TBA  GAIT: Distance walked: Into clinic Assistive device utilized: Single point cane Level of assistance: SBA Comments: Guarded, slow, diminished bilat step length, decreased stance time R>L, flexed at hips  TREATMENT DATE:  11/25/23 ***  11/13/23 TherEx Prone at rest x2 min Prone press up x5 attempted but pt reports feeling it a lot more in her R LE Prone hamstring curl 2x10 Prone quad stretch x 30 Prone glute set 2x10 Prone hip ext x10  Therapeutic Activity Standing shoulder ext red TB 2x10 Standing row red TB 2x10  Gait Trianing Amb without a/d working on decreasing R foot pronation to reduce medial R knee pain/valgus, cues for hip extension to maintain upright posture Amb with SPC for improved stability working on the same above issues and able to obtain increased step length and clearance  11/11/23 TherEx Nustep L6 x 5 min UEs/LEs Standing hip flexor stretch on aerobic step 2x30 Lumbar ext against counter x10  Therapeutic Activity Foot on aerobic step in staggered stance glute setting x10 Standing hip ext slide no UE support to fatigue Standing hip abd with UE support to fatigue   10/24/23 TherEx Scifit L1 x 5 min for warm up Seated  hamstring stretch x 30 Seated hip flexor stretch 2x30 Seated piriformis stretch 2x30 Seated figure 4 stretch x30  Therapeutic Activity Standing hip ext 2x10 to fatigue Staggered stance glute set Amb with glute setting during stance phase   10/20/23 TherEx NuStep L6 x 8 min Lower trunk rotation 10 x 10 sec hold bil Supine piriformis stretch 3x30 sec bil (modified on Lt)  Manual STM with compression to Rt glutes; skilled palpation and monitoring of soft tissue during DN  Trigger Point Dry Needling  Subsequent Treatment: Instructions provided previously at initial dry needling treatment.   Patient Verbal Consent Given: Yes Education Handout Provided: Previously Provided Muscles Treated: Rt glute med Electrical Stimulation Performed: Yes, Parameters: 10 mA freq x 3 min; intensity to tolerance Treatment Response/Outcome: twitch responses with decreased tightness reported     PATIENT EDUCATION:  Education details: Exam findings, initial HEP, POC Person educated: Patient Education method: Explanation, Demonstration, and Handouts Education comprehension: verbalized understanding, returned demonstration, and needs further education  HOME EXERCISE PROGRAM: Access Code: [email or web address] URL: [email or web address] Date: 11/13/2023 Prepared by: Donnald Tabar April Erman [place]  Exercises - Modified Thomas Stretch  - 1 x daily - 7 x weekly - 1 sets - 2 reps - 30 sec hold - Supine Piriformis Stretch with Foot on Ground  - 1 x daily - 7 x weekly - 1 sets - 2 reps - 30 sec hold - Seated Piriformis Stretch  - 1 x daily - 7 x weekly - 2 sets - 30 sec hold - Seated Piriformis Stretch  - 1 x daily - 7 x weekly - 2 sets - 30 sec hold - Standing Hip Extension with Counter Support  - 1 x daily - 7 x weekly - 2 sets - 10 reps - Standing Hip Abduction with Counter Support  - 1 x daily - 7 x weekly - 2 sets - 10 reps - Prone Knee Flexion  - 1 x daily - 7 x weekly - 2 sets - 10 reps -  Prone Hip Extension  - 1 x daily - 7 x weekly - 1 sets - 10 reps - Shoulder extension with resistance - Neutral  - 1 x daily - 7 x weekly - 2 sets - 10 reps - Standing Bilateral Low Shoulder Row with Anchored Resistance  - 1 x daily - 7 x weekly - 2 sets - 10 reps  ASSESSMENT:  CLINICAL IMPRESSION: *** Worked on exercises in prone to improve pt's lumbar and hip extension. Remains tight and shortened in her quads and hip flexors. Added midback strengthening to improve trunk extension. Pt reports she may need to be on hold for PT as she is traveling to her Summer home in [place] for the next few months.   OBJECTIVE IMPAIRMENTS: Abnormal gait, decreased activity tolerance, decreased balance, decreased endurance, decreased mobility, difficulty walking, decreased ROM, decreased strength, hypomobility, increased fascial restrictions, increased muscle spasms, improper body mechanics, postural dysfunction, and pain.     GOALS: Goals reviewed with patient? Yes  SHORT TERM GOALS: Target date: 11/13/2023   Pt will be ind with initial HEP Baseline: Goal status: MET  2.  Pt will report >/=50% improvement with pain Baseline:  11/11/23: 70-75% improvement Goal status: MET    LONG TERM GOALS: Target date: 12/11/2023   Pt will be ind with management and progression of HEP Baseline:  Goal status: INITIAL  2.  Pt will be able to tolerate standing at least 20 min without pain Baseline:  Goal status: INITIAL  3.  Pt will have improved PSFS average score to >/=5/10 to demo MCID Baseline:  Goal status: INITIAL  4.  Pt will be able to perform all lumbar ROM without pain for bending and lifting tasks at home and in the community Baseline:  Goal status: INITIAL  5.  Pt will report >/=75% improvement with her pain Baseline:  Goal status: INITIAL    PLAN:  PT FREQUENCY: 2x/week  PT DURATION: 8 weeks  PLANNED INTERVENTIONS: 97164- PT Re-evaluation, 97110-Therapeutic exercises, 97530-  Therapeutic activity, 97112- Neuromuscular re-education, 97535- Self Care, 96045- Manual therapy, [phone]- Gait training, [phone]- Aquatic Therapy, [phone]- Electrical stimulation (unattended), [phone]- Traction (mechanical), [id number]- Ionotophoresis 4mg /ml Dexamethasone , Patient/Family education, Balance training, Stair training, Taping, Dry Needling, Joint mobilization, Spinal mobilization, Cryotherapy, and Moist heat.  PLAN FOR NEXT SESSION: Hip stretching/strengthening. Initiate core strengthening. Manual therapy/dry needling as indicated.    Crystel Demarco April Ma L Dimples Probus, PT, DPT 11/25/23 7:55 AM    Referring diagnosis? M54.41 (ICD-10-CM) - Acute right-sided low back pain with right-sided sciatica M25.551 (ICD-10-CM) - Pain of right hip Treatment diagnosis? (if different than referring diagnosis) M25.551 Pain in right hip, M62.81 Muscle Weakness What was this (referring dx) caused by? []  Surgery []  Fall [x]  Ongoing issue []  Arthritis []  Other: ____________  Laterality: [x]  Rt []  Lt []  Both  Check all possible CPT codes:  *CHOOSE 10 OR LESS*    See Planned Interventions listed in the Plan section of the Evaluation.

## 2024-04-12 ENCOUNTER — Encounter: Payer: Self-pay | Admitting: Radiology
